# Patient Record
Sex: Female | Born: 1935 | Race: White | Hispanic: No | Smoking: Never smoker
Health system: Southern US, Community
[De-identification: ages and names within clinical notes are randomized; demographics above are authoritative.]

## PROBLEM LIST (undated history)

## (undated) DIAGNOSIS — I10 Essential (primary) hypertension: Secondary | ICD-10-CM

## (undated) DIAGNOSIS — Z8719 Personal history of other diseases of the digestive system: Secondary | ICD-10-CM

## (undated) DIAGNOSIS — N281 Cyst of kidney, acquired: Secondary | ICD-10-CM

## (undated) DIAGNOSIS — M1990 Unspecified osteoarthritis, unspecified site: Secondary | ICD-10-CM

## (undated) DIAGNOSIS — Z9289 Personal history of other medical treatment: Secondary | ICD-10-CM

## (undated) DIAGNOSIS — Z8701 Personal history of pneumonia (recurrent): Secondary | ICD-10-CM

## (undated) DIAGNOSIS — E785 Hyperlipidemia, unspecified: Secondary | ICD-10-CM

## (undated) DIAGNOSIS — K219 Gastro-esophageal reflux disease without esophagitis: Secondary | ICD-10-CM

## (undated) HISTORY — PX: EYE SURGERY: SHX253

## (undated) HISTORY — DX: Personal history of other medical treatment: Z92.89

## (undated) HISTORY — DX: Cyst of kidney, acquired: N28.1

## (undated) HISTORY — PX: RHINOPLASTY: SUR1284

## (undated) HISTORY — DX: Hyperlipidemia, unspecified: E78.5

## (undated) HISTORY — PX: COLONOSCOPY: SHX174

## (undated) HISTORY — PX: DILATION AND CURETTAGE OF UTERUS: SHX78

## (undated) HISTORY — DX: Essential (primary) hypertension: I10

## (undated) HISTORY — PX: TONSILLECTOMY: SUR1361

---

## 1998-04-14 ENCOUNTER — Other Ambulatory Visit: Admission: RE | Admit: 1998-04-14 | Discharge: 1998-04-14 | Payer: Self-pay | Admitting: *Deleted

## 1999-04-16 ENCOUNTER — Other Ambulatory Visit: Admission: RE | Admit: 1999-04-16 | Discharge: 1999-04-16 | Payer: Self-pay | Admitting: *Deleted

## 2000-04-12 ENCOUNTER — Other Ambulatory Visit: Admission: RE | Admit: 2000-04-12 | Discharge: 2000-04-12 | Payer: Self-pay | Admitting: *Deleted

## 2000-07-15 ENCOUNTER — Other Ambulatory Visit: Admission: RE | Admit: 2000-07-15 | Discharge: 2000-07-15 | Payer: Self-pay | Admitting: Urology

## 2000-07-20 ENCOUNTER — Encounter: Payer: Self-pay | Admitting: Urology

## 2000-07-20 ENCOUNTER — Encounter: Admission: RE | Admit: 2000-07-20 | Discharge: 2000-07-20 | Payer: Self-pay | Admitting: Urology

## 2000-07-22 ENCOUNTER — Other Ambulatory Visit: Admission: RE | Admit: 2000-07-22 | Discharge: 2000-07-22 | Payer: Self-pay | Admitting: Urology

## 2001-04-10 ENCOUNTER — Other Ambulatory Visit: Admission: RE | Admit: 2001-04-10 | Discharge: 2001-04-10 | Payer: Self-pay | Admitting: Obstetrics and Gynecology

## 2002-04-16 ENCOUNTER — Other Ambulatory Visit: Admission: RE | Admit: 2002-04-16 | Discharge: 2002-04-16 | Payer: Self-pay | Admitting: Obstetrics and Gynecology

## 2003-04-19 ENCOUNTER — Other Ambulatory Visit: Admission: RE | Admit: 2003-04-19 | Discharge: 2003-04-19 | Payer: Self-pay | Admitting: Obstetrics and Gynecology

## 2003-10-06 HISTORY — PX: HIP SURGERY: SHX245

## 2003-10-14 ENCOUNTER — Inpatient Hospital Stay (HOSPITAL_COMMUNITY): Admission: RE | Admit: 2003-10-14 | Discharge: 2003-10-18 | Payer: Self-pay | Admitting: Orthopedic Surgery

## 2005-03-09 ENCOUNTER — Ambulatory Visit: Payer: Self-pay | Admitting: Internal Medicine

## 2005-03-23 ENCOUNTER — Encounter (INDEPENDENT_AMBULATORY_CARE_PROVIDER_SITE_OTHER): Payer: Self-pay | Admitting: *Deleted

## 2005-03-23 ENCOUNTER — Ambulatory Visit: Payer: Self-pay | Admitting: Internal Medicine

## 2005-07-02 ENCOUNTER — Other Ambulatory Visit: Admission: RE | Admit: 2005-07-02 | Discharge: 2005-07-02 | Payer: Self-pay | Admitting: Obstetrics and Gynecology

## 2007-08-11 ENCOUNTER — Other Ambulatory Visit: Admission: RE | Admit: 2007-08-11 | Discharge: 2007-08-11 | Payer: Self-pay | Admitting: Obstetrics and Gynecology

## 2009-10-05 HISTORY — PX: HIP SURGERY: SHX245

## 2009-10-27 ENCOUNTER — Inpatient Hospital Stay (HOSPITAL_COMMUNITY): Admission: RE | Admit: 2009-10-27 | Discharge: 2009-10-30 | Payer: Self-pay | Admitting: Orthopedic Surgery

## 2010-02-05 ENCOUNTER — Encounter: Payer: Self-pay | Admitting: Internal Medicine

## 2010-02-23 ENCOUNTER — Encounter (INDEPENDENT_AMBULATORY_CARE_PROVIDER_SITE_OTHER): Payer: Self-pay | Admitting: *Deleted

## 2010-03-27 ENCOUNTER — Encounter (INDEPENDENT_AMBULATORY_CARE_PROVIDER_SITE_OTHER): Payer: Self-pay | Admitting: *Deleted

## 2010-03-31 ENCOUNTER — Ambulatory Visit: Payer: Self-pay | Admitting: Internal Medicine

## 2010-04-14 ENCOUNTER — Ambulatory Visit: Payer: Self-pay | Admitting: Internal Medicine

## 2010-07-07 NOTE — Letter (Signed)
Summary: [facility] Instructions  [facility]  [address] [place], [place] [address]   Phone: [phone]  Fax: [phone]       Lauren Hill    12-Dec-1934    MRN: [id number]        Procedure Day /Date:  04/14/10   Tuesday     Arrival Time:  8:00am      Procedure Time:  9:00am     Location of Procedure:                    _ x_  [facility] (4th Floor)                        PREPARATION FOR COLONOSCOPY WITH MOVIPREP   Starting 5 days prior to your procedure _ 11/3/11_ do not eat nuts, seeds, popcorn, corn, beans, peas,  salads, or any raw vegetables.  Do not take any fiber supplements (e.g. Metamucil, Citrucel, and Benefiber).  THE DAY BEFORE YOUR PROCEDURE         DATE:   04/13/10   DAY:  Monday  1.  Drink clear liquids the entire day-NO SOLID FOOD  2.  Do not drink anything colored red or purple.  Avoid juices with pulp.  No orange juice.  3.  Drink at least 64 oz. (8 glasses) of fluid/clear liquids during the day to prevent dehydration and help the prep work efficiently.  CLEAR LIQUIDS INCLUDE: Water Jello Ice Popsicles Tea (sugar ok, no milk/cream) Powdered fruit flavored drinks Coffee (sugar ok, no milk/cream) Gatorade Juice: apple, white grape, white cranberry  Lemonade Clear bullion, consomm, broth Carbonated beverages (any kind) Strained chicken noodle soup Hard Candy                             4.  In the morning, mix first dose of MoviPrep solution:    Empty 1 Pouch A and 1 Pouch B into the disposable container    Add lukewarm drinking water to the top line of the container. Mix to dissolve    Refrigerate (mixed solution should be used within 24 hrs)  5.  Begin drinking the prep at 5:00 p.m. The MoviPrep container is divided by 4 marks.   Every 15 minutes drink the solution down to the next mark (approximately 8 oz) until the full liter is complete.   6.  Follow completed prep with 16 oz of clear liquid of your  choice (Nothing red or purple).  Continue to drink clear liquids until bedtime.  7.  Before going to bed, mix second dose of MoviPrep solution:    Empty 1 Pouch A and 1 Pouch B into the disposable container    Add lukewarm drinking water to the top line of the container. Mix to dissolve    Refrigerate  THE DAY OF YOUR PROCEDURE      DATE:  04/14/10  DAY:  Tuesday  Beginning at 4:00 a.m. (5 hours before procedure):         1. Every 15 minutes, drink the solution down to the next mark (approx 8 oz) until the full liter is complete.  2. Follow completed prep with 16 oz. of clear liquid of your choice.    3. You may drink clear liquids until  7:00am  (2 HOURS BEFORE PROCEDURE).   MEDICATION INSTRUCTIONS  Unless otherwise instructed, you should take regular prescription medications with a  small sip of water   as early as possible the morning of your procedure.        OTHER INSTRUCTIONS  You will need a responsible adult at least 74 years of age to accompany you and drive you home.   This person must remain in the waiting room during your procedure.  Wear loose fitting clothing that is easily removed.  Leave jewelry and other valuables at home.  However, you may wish to bring a book to read or  an iPod/MP3 player to listen to music as you wait for your procedure to start.  Remove all body piercing jewelry and leave at home.  Total time from sign-in until discharge is approximately 2-3 hours.  You should go home directly after your procedure and rest.  You can resume normal activities the  day after your procedure.  The day of your procedure you should not:   Drive   Make legal decisions   Operate machinery   Drink alcohol   Return to work  You will receive specific instructions about eating, activities and medications before you leave.    The above instructions have been reviewed and explained to me by   Wyona Almas RN  March 31, 2010 11:26 AM     I  fully understand and can verbalize these instructions _____________________________ Date _________

## 2010-07-07 NOTE — Procedures (Signed)
Summary: Colonoscopy  Patient: Lauren Hill Note: All result statuses are Final unless otherwise noted.  Tests: (1) Colonoscopy (COL)   COL Colonoscopy           DONE     [facility]     [address]     [place], [place]  [address]           COLONOSCOPY PROCEDURE REPORT           PATIENT:  Lauren Hill, Lauren Hill  MR#:  [id number]     BIRTHDATE:  [birth year], 74 yrs. old  GENDER:  female     ENDOSCOPIST:  Wilhemina Bonito. Eda Keys, MD     REF. BY:  Surveillance Program Recall,     PROCEDURE DATE:  04/14/2010     PROCEDURE:  Surveillance Colonoscopy     ASA CLASS:  Class II     INDICATIONS:  history of pre-cancerous (adenomatous) colon polyps,     surveillance and high-risk screening ; index 2003 (TA); f/u 2006     (neg)     MEDICATIONS:   Fentanyl 75 mcg IV, Versed 9 mg IV           DESCRIPTION OF PROCEDURE:   After the risks benefits and     alternatives of the procedure were thoroughly explained, informed     consent was obtained.  Digital rectal exam was performed and     revealed no abnormalities.   The LB 180AL [id number] endoscope was     introduced through the anus and advanced to the cecum, which was     identified by both the appendix and ileocecal valve, without     limitations.Time to cecum = 2:00 min.  The quality of the prep was     good, using MoviPrep.  The instrument was then slowly withdrawn     (time = 13:33 min) as the colon was fully examined.     <<PROCEDUREIMAGES>>           FINDINGS:  Moderate diverticulosis was found in the sigmoid colon.     This was otherwise a normal examination of the colon.  No polyps     or cancers were seen.   Retroflexed views in the rectum revealed     internal hemorrhoids.    The scope was then withdrawn from the     patient and the procedure completed.           COMPLICATIONS:  None     ENDOSCOPIC IMPRESSION:     1) Moderate diverticulosis in the sigmoid colon     2) Otherwise normal examination     3) No polyps or cancers     4) Internal hemorrhoids     RECOMMENDATIONS:     1) Follow up colonoscopy in 5 years           ______________________________     Wilhemina Bonito. Eda Keys, MD           CC:  Geoffry Paradise, MD;The Patient           n.     Rosalie DoctorWilhemina Bonito. Eda Keys at 04/14/2010 09:56 AM           Lorain Childes, [phone]  Note: An exclamation mark (!) indicates a result that was not dispersed into the flowsheet. Document Creation Date: 04/14/2010 9:57 AM _______________________________________________________________________  (1) Order result status: Final Collection or observation date-time: 04/14/2010 09:51 Requested date-time:  Receipt date-time:  Reported date-time:  Referring Physician:   Ordering Physician: Fransico Setters [id number]) Specimen Source:  Source: Launa Grill Order Number: [id number] Lab site:   Appended Document: Colonoscopy    Clinical Lists Changes  Observations: Added new observation of COLONNXTDUE: 04/2015 (04/14/2010 13:08)

## 2010-07-07 NOTE — Letter (Signed)
Summary: Pre Visit Letter Revised  [facility]  [address] [place], [place] [address]   Phone: [phone]  Fax: [phone]        02/23/2010 MRN: [id number] Lauren Hill [address] [place], [place]  [address]             Procedure Date:  03-31-10      Welcome to the Gastroenterology Division at [facility].    You are scheduled to see a nurse for your pre-procedure visit on 04-14-10 at 9:00a.m. on the 3rd floor at [facility], [address]  We ask that you try to arrive at our office 15 minutes prior to your appointment time to allow for check-in.  Please take a minute to review the attached form.  If you answer "Yes" to one or more of the questions on the first page, we ask that you call the person listed at your earliest opportunity.  If you answer "No" to all of the questions, please complete the rest of the form and bring it to your appointment.    Your nurse visit will consist of discussing your medical and surgical history, your immediate family medical history, and your medications.   If you are unable to list all of your medications on the form, please bring the medication bottles to your appointment and we will list them.  We will need to be aware of both prescribed and over the counter drugs.  We will need to know exact dosage information as well.    Please be prepared to read and sign documents such as consent forms, a financial agreement, and acknowledgement forms.  If necessary, and with your consent, a friend or relative is welcome to sit-in on the nurse visit with you.  Please bring your insurance card so that we may make a copy of it.  If your insurance requires a referral to see a specialist, please bring your referral form from your primary care physician.  No co-pay is required for this nurse visit.     If you cannot keep your appointment, please call [phone] to cancel or reschedule prior to your appointment date.  This  allows [place] the opportunity to schedule an appointment for another patient in need of care.    Thank you for choosing [facility] for your medical needs.  We appreciate the opportunity to care for you.  Please visit [place] at our website  to learn more about our practice.  Sincerely, The Gastroenterology Division

## 2010-07-07 NOTE — Letter (Signed)
Summary: Colonoscopy Letter  [facility]  [address] [place], [place] [address]   Phone: [phone]  Fax: [phone]      February 05, 2010 MRN: [id number]   [facility] [address] [place], [place]  [address]   Dear Lauren Hill,   According to your medical record, it is time for you to schedule a Colonoscopy. The American Cancer Society recommends this procedure as a method to detect early colon cancer. Patients with a family history of colon cancer, or a personal history of colon polyps or inflammatory bowel disease are at increased risk.  This letter has been generated based on the recommendations made at the time of your procedure. If you feel that in your particular situation this may no longer apply, please contact our office.  Please call our office at [phone] to schedule this appointment or to update your records at your earliest convenience.  Thank you for cooperating with [place] to provide you with the very best care possible.   Sincerely,  Wilhemina Bonito. Marina Goodell, M.D.  [facility] [phone]

## 2010-07-07 NOTE — Miscellaneous (Signed)
Summary: [facility] Previsit/prep  Clinical Lists Changes  Medications: Added new medication of MOVIPREP 100 GM  SOLR (PEG-KCL-NACL-NASULF-NA ASC-C) As per prep instructions. - Signed Rx of MOVIPREP 100 GM  SOLR (PEG-KCL-NACL-NASULF-NA ASC-C) As per prep instructions.;  #1 x 0;  Signed;  Entered by: Wyona Almas RN;  Authorized by: Hilarie Fredrickson MD;  Method used: Electronically to [facility]*, [address], [place], [place]  [id number], Ph: [phone], Fax: [phone] Observations: Added new observation of NKA: T (03/31/2010 10:45)    Prescriptions: MOVIPREP 100 GM  SOLR (PEG-KCL-NACL-NASULF-NA ASC-C) As per prep instructions.  #1 x 0   Entered by:   Wyona Almas RN   Authorized by:   Hilarie Fredrickson MD   Signed by:   Wyona Almas RN on 03/31/2010   Method used:   Electronically to        [facility]* (retail)       [address]       [place], [place]  [id number]       Ph: [phone]       Fax: [phone]   RxID:   [id number]

## 2010-08-24 LAB — CBC
HCT: 33 % — ABNORMAL LOW (ref 36.0–46.0)
HCT: 33.4 % — ABNORMAL LOW (ref 36.0–46.0)
HCT: 34.3 % — ABNORMAL LOW (ref 36.0–46.0)
Hemoglobin: 11.3 g/dL — ABNORMAL LOW (ref 12.0–15.0)
Hemoglobin: 15 g/dL (ref 12.0–15.0)
MCHC: 33.8 g/dL (ref 30.0–36.0)
MCHC: 33.8 g/dL (ref 30.0–36.0)
MCHC: 33.8 g/dL (ref 30.0–36.0)
MCV: 92.9 fL (ref 78.0–100.0)
MCV: 93.1 fL (ref 78.0–100.0)
Platelets: 280 10*3/uL (ref 150–400)
RBC: 3.51 MIL/uL — ABNORMAL LOW (ref 3.87–5.11)
RBC: 3.59 MIL/uL — ABNORMAL LOW (ref 3.87–5.11)
RDW: 12.6 % (ref 11.5–15.5)
RDW: 12.7 % (ref 11.5–15.5)
RDW: 12.8 % (ref 11.5–15.5)
WBC: 12.3 10*3/uL — ABNORMAL HIGH (ref 4.0–10.5)
WBC: 17.1 10*3/uL — ABNORMAL HIGH (ref 4.0–10.5)

## 2010-08-24 LAB — BASIC METABOLIC PANEL
CO2: 27 mEq/L (ref 19–32)
Calcium: 8.5 mg/dL (ref 8.4–10.5)
Creatinine, Ser: 0.64 mg/dL (ref 0.4–1.2)
GFR calc Af Amer: >60 mL/min (ref >60)
GFR calc non Af Amer: >60 mL/min (ref >60)
Glucose, Bld: 132 mg/dL — ABNORMAL HIGH (ref 70–99)
Glucose, Bld: 134 mg/dL — ABNORMAL HIGH (ref 70–99)
Potassium: 3.9 mEq/L (ref 3.5–5.1)
Potassium: 3.9 mEq/L (ref 3.5–5.1)
Sodium: 138 mEq/L (ref 135–145)
Sodium: 139 mEq/L (ref 135–145)

## 2010-08-24 LAB — COMPREHENSIVE METABOLIC PANEL
AST: 29 U/L (ref 0–37)
Alkaline Phosphatase: 95 U/L (ref 39–117)
BUN: 12 mg/dL (ref 6–23)
Calcium: 9.9 mg/dL (ref 8.4–10.5)
GFR calc Af Amer: >60 mL/min (ref >60)
GFR calc non Af Amer: >60 mL/min (ref >60)
Potassium: 4.6 mEq/L (ref 3.5–5.1)
Sodium: 136 mEq/L (ref 135–145)

## 2010-08-24 LAB — TYPE AND SCREEN
ABO/RH(D): A POS
Antibody Screen: NEGATIVE

## 2010-08-24 LAB — URINE MICROSCOPIC-ADD ON

## 2010-08-24 LAB — PROTIME-INR
INR: 1.53 — ABNORMAL HIGH (ref 0.00–1.49)
Prothrombin Time: 13.2 seconds (ref 11.6–15.2)
Prothrombin Time: 18.3 seconds — ABNORMAL HIGH (ref 11.6–15.2)
Prothrombin Time: 20.3 seconds — ABNORMAL HIGH (ref 11.6–15.2)

## 2010-08-24 LAB — URINALYSIS, ROUTINE W REFLEX MICROSCOPIC
Bilirubin Urine: NEGATIVE
Specific Gravity, Urine: 1.007 (ref 1.005–1.030)
Urobilinogen, UA: 0.2 mg/dL (ref 0.0–1.0)

## 2010-08-24 LAB — APTT: aPTT: 44 seconds — ABNORMAL HIGH (ref 24–37)

## 2010-08-24 LAB — ABO/RH: ABO/RH(D): A POS

## 2010-10-23 NOTE — Discharge Summary (Signed)
NAME:  Lauren Hill, Lauren Hill NO.:  [id number]   MEDICAL RECORD NO.:  [id number]                   PATIENT TYPE:  INP   LOCATION:  0467                                 FACILITY:  [facility]   PHYSICIAN:  Ollen Gross, M.D.                 DATE OF BIRTH:  07-05-1934   DATE OF ADMISSION:  10/14/2003  DATE OF DISCHARGE:  10/18/2003                                 DISCHARGE SUMMARY   ADMISSION DIAGNOSES:  1. Osteoarthritis, right hip.  2. History of bronchitis.  3. History of pneumonia.  4. Hypertension.  5. Hiatal hernia.  6. Hemorrhoids.  7. History of urinary tract infection.  8. Degenerative joint disease.   DISCHARGE DIAGNOSES:  1. Osteoarthritis, right hip, status post right total hip arthroplasty.  2. History of bronchitis.  3. History of pneumonia.  4. Hypertension.  5. Hiatal hernia.  6. Hemorrhoids.  7. History of urinary tract infection.  8. Degenerative joint disease.   PROCEDURE:  On day of surgery, Oct 14, 2003, right total hip arthroplasty.  Surgeon ws Dr. Ollen Gross, assistant Lauren L. Perkins, PA-C. Spinal  anesthesia. Blood loss 700 cc.   CONSULTATIONS/COURTESY VISIT:  Dr. Jacky Kindle.   BRIEF HISTORY:  Lauren Hill is a 76 year old female with end-stage arthritis  of the right hip. She has had intractable pain and now presents for right  total hip arthroplasty.   LABORATORY DATA:  CBC on admission reveals hemoglobin of 14.1, hematocrit  42.2, white blood cell count 8.7, and differential all within normal limits.  Postoperative H&H 11.3 and 33.0 with last noted H&H of 11.1 and 32.6.  PT  and PTT preoperatively 11.8 and 35 respectively with an INR of 0.8. Serial  protimes followed with last noted PT-INR of 18.3 and 1.8. Chem panel on  admission all within normal limits. Serial BMETs were followed. Glucose went  up from 96 to 146 and down to 120.  BUN dropped from 16 to 4. Otherwise,  BMETs remained within normal limits. Urinalysis  preoperatively revealed  large hemoglobin, small leukocyte esterase, mini epithelial cells, 0-2 white  cells, 3-6 red cells, a few bacteria, hyaline cast. Blood group type A  positive.   Pelvis right hip films, preoperatively, revealed osteoarthritis involving  the right hip. Pelvis film confirmed bilateral hip arthritis, right more so  than left. Postoperative hip films taken on Oct 14, 2003, showed satisfactory  appearance of right total hip arthroplasty.   HOSPITAL COURSE:  The patient admitted to [facility], taken to the  operating room, and underwent the above-stated procedure without  complications. The patient tolerated the procedure well, later transferred  to the recovery room, and then to the orthopedic floor for continued  postoperative care. Vital signs were followed. The patient is given 24 hours  postoperative antibiotics in the form of Ancef, Coumadin for DVT  prophylaxis, and started back on her home medications, partial weightbearing  50% to  the right lower extremity. PT and OT were consulted postoperatively.  Hemovac drain that was placed at the time of surgery was pulled on  postoperative day one.  Fluids were KVO. She actually did very well for the  first night and actually got some sleep. On day two, Dr. Jacky Kindle dropped by  as a courtesy visit, her primary care Lauren Hill. She was actually doing  fairly well and started getting up with physical therapy and ambulating  approximately 60 feet. It is anticipated that she would do quite well and be  able to go home in the next day or so. Dressing change initiated on  postoperative day two and incision was healing well. She did have a little  bit of blistering proximal to the incision. Tegaderms were applied.  Hemoglobin remained stable. She continued to receive therapy and progressed  on day three and day four, and eventually got up to the point where she was  walking 130 feet. She was tolerating her medications,  been weaned over to  p.o. medications. Her PCA had been discontinued by day two. She was  tolerating her medication and was discharged home on Oct 18, 2003.   DISCHARGE PLAN:  1. The patient is discharged home on Oct 18, 2003.  2. For discharge diagnosis, please see above.  3. Discharge medications--Coumadin, Percocet, Robaxin.  4. Diet--low sodium diet.  5. Activity--she is partial weightbearing to the right lower extremity, home     health PT and home health nursing for continued total hip protocol.  6. Followup--in two weeks, to call the office for an  appointment at [phone].   DISPOSITION:  Home.   CONDITION ON DISCHARGE:  Improved.     Lauren L. Julien Girt, P.A.              Ollen Gross, M.D.    ALP/MEDQ  D:  11/20/2003  T:  11/20/2003  Job:  [id number]   cc:   Geoffry Paradise, M.D.  [address]  [place]  [place] [address]  Fax: [phone]

## 2010-10-23 NOTE — Op Note (Signed)
NAME:  Lauren Hill, Lauren Hill NO.:  [id number]   MEDICAL RECORD NO.:  [id number]                   PATIENT TYPE:  INP   LOCATION:  0011                                 FACILITY:  [facility]   PHYSICIAN:  Ollen Gross, M.D.                 DATE OF BIRTH:  03-07-1935   DATE OF PROCEDURE:  10/14/2003  DATE OF DISCHARGE:                                 OPERATIVE REPORT   PREOPERATIVE DIAGNOSIS:  Osteoarthritis, right hip.   POSTOPERATIVE DIAGNOSIS:  Osteoarthritis, right hip.   PROCEDURE:  Right total hip arthroplasty.   SURGEON:  Ollen Gross, M.D.   ASSISTANT:  Alexzandrew L. Julien Girt, P.A.   ANESTHESIA:  Spinal.   ESTIMATED BLOOD LOSS:  200.   DRAINS:  None.   COMPLICATIONS:  None.   CONDITION:  Stable to the recovery room.   CLINICAL NOTE:  Lauren Hill is a 76 year old female with end-stage arthritis  of the right hip with intractable pain.  She presents now for right total  hip arthroplasty.   PROCEDURE:  After successful administration of a spinal anesthetic, the  patient was placed in the left lateral decubitus position with the right  side up and held with a hip positioner.  The right lower extremity was  isolated from her perineum with plastic drapes and prepped and draped in the  usual sterile fashion.  A short posterolateral incision was made with a 10  blade through the subcutaneous tissue to the level of the fascial lata,  which was incised in line with the skin incision.  The sciatic nerve was  palpated and protected.  Short rotators are isolated off the femur.  A  capsulectomy is performed, and the hip is dislocated.  The center of the  femoral head is marked, and a trial prosthesis placed such that the center  of the trial head corresponds to the center of the native femoral head.  The  osteotomy line is marked at the femoral neck, and an osteotomy is made with  an oscillating saw.  The femur is then retracted anteriorly to gain  acetabular exposure.   The acetabular, labrum, and osteophytes are removed.  The reaming starts at  a 47, coursing in increments of 2 to a 51, then a 52 mm Pinnacle acetabular  shell was placed in anatomic position and transfixed with two dome screws.  The trial 32 mm neutral liner was placed.   The femur is prepared first with the canal finder, then irrigation.  Axial  reaming is performed at 13.5 mm, proximal reaming to an 18B, and the sleeve  machine to a small.  An 18B small trial sleeve is placed with an 18 x 13  stem and a 36+8 neck.  We went 10 degrees beyond her native anteversion, as  her native version was barely beyond neutral.  The 32+0 trial head is  placed.  The hip is reduced, and there is  a tiny bit of soft tissue laxity.  We converted her to a 32+4 neutral liner, and that eliminated most of the  laxity.  She had full extension.  Flexion and rotation showed 70 degrees  flexion, 40 degrees adduction, 90 degrees internal rotation and 90 degrees  flexion, 90 degrees of internal rotation without dislocating.  The hip is  then dislocated, and all trials were removed.  The permanent apex hole  eliminator is placed into the acetabular shell, and the permanent 32 mm  neutral plus Forum Marathon liner is placed into the shell.  The 18B small  sleeve is placed into the proximal femur with an 18x13 stem and the 36+8  neck, 10 degrees beyond her native anteversion.  Then the 32+0 head is  placed, and the hip is then reduced to the same stability of parameters.  The wound is copiously irrigated with antibiotic solution, and the short  rotator is reattached to the femur through drill holes.  The fascial lata is  closed over a Hemovac drain with interrupted #1 Vicryl.  The subcu is closed  with #1 and 2-0 Vicryl and subcuticular running 4-0 Monocryl.  The incision  is clean and dry.  Steri-Strips are applied.  The Hemovac is hooked to  suction, and a bulky sterile dressing applied.  She  is placed into a knee  immobilizer, awakened and transported to the recovery room in stable  condition.                                               Ollen Gross, M.D.    FA/MEDQ  D:  10/14/2003  T:  10/14/2003  Job:  [id number]

## 2010-10-23 NOTE — H&P (Signed)
NAME:  Lauren Hill, Lauren Hill NO.:  [id number]   MEDICAL RECORD NO.:  [id number]                   PATIENT TYPE:  INP   LOCATION:  0467                                 FACILITY:  [facility]   PHYSICIAN:  Ollen Gross, M.D.                 DATE OF BIRTH:  1935/05/17   DATE OF ADMISSION:  10/14/2003  DATE OF DISCHARGE:                                HISTORY & PHYSICAL   CHIEF COMPLAINT:  Right hip pain.   HISTORY OF PRESENT ILLNESS:  The patient is a 76 year old female with a 1-  year history of progressive right hip pain.  It started in her groin, and is  traveling down the anterior thigh.  It is worse with activity, and generally  gets better with rest.  She does have some difficulty ambulating at times.  She has tried anti-inflammatories in the past, which has not provided much  help.  She was seen in the office where x-rays showed end-stage arthritis of  the right hip with bone-on-bone changes and subchondral cystic formation.  It is felt she would benefit from undergoing a total hip replacement.  Risks  and benefits have been discussed with the patient, and she is at a point  where she would like to proceed with surgery now.   ALLERGIES:  No known drug allergies.   CURRENT MEDICATIONS:  1. Lipitor 10 mg daily.  2. Lotrel 5/10 twice a day.  3. Atenolol 50 mg daily.  4. Supplement of B-12.  5. Supplement of vitamin D.  6. Supplement of potassium gluconate.  7. Aspirin.  8. Ecotrin 81 mg  9. Caltrate 600 plus D.  10.      Anacin as needed.  11.      Advil as needed.   PAST MEDICAL HISTORY:  1. History of bronchitis.  2. History of pneumonia.  3. Hypertension.  4. Hiatal hernia.  5. Hemorrhoids.  6. History of urinary tract infections.  7. Degenerative disk disease.   PAST SURGICAL HISTORY:  1. Tonsillectomy.  2. D&C.   SOCIAL HISTORY:  Widowed.  Retired Runner, broadcasting/film/video.  Nonsmoker.  No alcohol.  Has 1  child.   FAMILY HISTORY:  Heart disease and  hypertension in both parents.   REVIEW OF SYSTEMS:  GENERAL:  No fevers, chills, night sweats.  NEUROLOGIC:  No seizures, syncope, or paralysis.  RESPIRATORY:  No shortness of breath,  productive cough, or hemoptysis.  CARDIOVASCULAR:  No chest pain, angina,  orthopnea.  GI:  No nausea, vomiting, diarrhea.  She does have some  intermittent constipation.  No blood or mucous in the stool.  GU:  No  dysuria, hematuria, or discharge.  MUSCULOSKELETAL:  Pertinent to the hip  found in the history of present illness.   PHYSICAL EXAMINATION:  VITAL SIGNS:  Pulse 76, respirations 14, blood  pressure 145/78.  GENERAL:  A 76 year old female, well-nourished, well-developed, in no acute  distress.  Alert, oriented, and cooperative.  Very pleasant at the time of  exam.  HEENT:  Normocephalic and atraumatic.  Pupils round and reactive.  Oropharynx is clear.  NECK:  Supple.  CHEST:  Clear, anterior and posterior chest walls.  No rhonchi, rales, or  wheezing.  HEART:  Regular rate and rhythm.  No murmurs.  ABDOMEN:  Soft.  Slightly round abdomen.  Bowel sounds are present.  Nontender.  RECTAL/BREAST/GENITALIA:  Not done.  Not pertinent to present illness.  EXTREMITIES:  Significant to the right hip.  Hip flexion of 90 degrees.  There is no internal rotation, no external rotation.  Only abduction is  limited to 10 degrees.  There is no shortening.  She does walk and ambulate  with an antalgic gait.   IMPRESSION:  1. Osteoarthritis of the right hip.  2. History of bronchitis.  3. History of pneumonia.  4. Hypertension.  5. Hiatal hernia.  6. Hemorrhoids.  7. History of urinary tract infection.  8. Degenerative disk disease.   PLAN:  The patient will be admitted to [facility] to undergo a  right total hip replacement arthroplasty.  The surgery will be performed by  Dr. Ollen Gross.     Alexzandrew L. Julien Girt, P.A.              Ollen Gross, M.D.    ALP/MEDQ  D:  10/14/2003   T:  10/14/2003  Job:  [id number]   cc:   Geoffry Paradise, M.D.  [address]  [place]  [place] [address]  Fax: [phone]

## 2012-09-12 ENCOUNTER — Other Ambulatory Visit: Payer: Self-pay | Admitting: Dermatology

## 2013-02-21 ENCOUNTER — Ambulatory Visit: Payer: Self-pay | Admitting: Obstetrics and Gynecology

## 2013-03-01 ENCOUNTER — Encounter: Payer: Self-pay | Admitting: Obstetrics & Gynecology

## 2013-03-02 ENCOUNTER — Ambulatory Visit (INDEPENDENT_AMBULATORY_CARE_PROVIDER_SITE_OTHER): Payer: Medicare Other | Admitting: Obstetrics & Gynecology

## 2013-03-02 ENCOUNTER — Encounter: Payer: Self-pay | Admitting: Obstetrics & Gynecology

## 2013-03-02 VITALS — BP 142/78 | HR 60 | Resp 12 | Ht 62.5 in | Wt 170.6 lb

## 2013-03-02 DIAGNOSIS — Z124 Encounter for screening for malignant neoplasm of cervix: Secondary | ICD-10-CM

## 2013-03-02 DIAGNOSIS — Z01419 Encounter for gynecological examination (general) (routine) without abnormal findings: Secondary | ICD-10-CM

## 2013-03-02 NOTE — Patient Instructions (Signed)

## 2013-03-02 NOTE — Progress Notes (Signed)
78 y.o. G1P1 WidowedCaucasianF here for annual exam.  Good friend with Marlene Lard.  Doing well.  Former patient of Dr. Tresa Res.  No vaginal bleeding.    Patient's last menstrual period was 06/08/1983.          Sexually active: no  The current method of family planning is none.    Exercising: yes  elipitical and ride bike Smoker:  no  Health Maintenance: Pap:  01/13/10 WNL History of abnormal Pap:  no MMG:  06/15/12 3D normal Colonoscopy:  2011 repeat in 5 years BMD:   12/10 will do this year with MMG TDaP:  Up to date with PCP Screening Labs: PCP, Hb today: PCP, Urine today: PCP   reports that she has never smoked. She has never used smokeless tobacco. She reports that she does not drink alcohol or use illicit drugs.  Past Medical History  Diagnosis Date  . Hypertension   . Hyperlipidemia   . Renal cyst   . History of blood transfusion     as an infant    Past Surgical History  Procedure Laterality Date  . Hip surgery  5/05    right hip replacement  . Hip surgery  5/11    left hip replacement  . Tonsillectomy    . Rhinoplasty    . Dilation and curettage of uterus      x2    Current Outpatient Prescriptions  Medication Sig Dispense Refill  . amLODipine-benazepril (LOTREL) 5-10 MG per capsule Take 1 capsule by mouth daily.      Marland Kitchen aspirin 81 MG tablet Take 81 mg by mouth daily.      Marland Kitchen atenolol (TENORMIN) 50 MG tablet Take 50 mg by mouth daily.      Marland Kitchen atorvastatin (LIPITOR) 10 MG tablet Take 10 mg by mouth daily.      . Calcium Carbonate (CALCIUM 600 PO) Take by mouth 2 (two) times daily. Plus vitamin d      . Multiple Vitamins-Minerals (ICAPS) CAPS Take by mouth 2 (two) times daily.      Marland Kitchen POTASSIUM PO Take by mouth.      . Calcium Carbonate Antacid (ANTACID PO) Take by mouth as needed.      . Famotidine (PEPCID PO) Take by mouth as needed.      . Ibuprofen (ADVIL PO) Take by mouth as needed.       No current facility-administered medications for this visit.     Family History  Problem Relation Age of Onset  . Heart disease Mother     ROS:  Pertinent items are noted in HPI.  Otherwise, a comprehensive ROS was negative.  Exam:   BP 142/78  Pulse 60  Resp 12  Ht 5' 2.5" (1.588 m)  Wt 170 lb 9.6 oz (77.384 kg)  BMI 30.69 kg/m2  LMP 06/08/1983  Weight change: +7 lbs  Height: 5' 2.5" (158.8 cm)  Ht Readings from Last 3 Encounters:  03/02/13 5' 2.5" (1.588 m)    General appearance: alert, cooperative and appears stated age Head: Normocephalic, without obvious abnormality, atraumatic Neck: no adenopathy, supple, symmetrical, trachea midline and thyroid normal to inspection and palpation Lungs: clear to auscultation bilaterally Breasts: normal appearance, no masses or tenderness Heart: regular rate and rhythm Abdomen: soft, non-tender; bowel sounds normal; no masses,  no organomegaly Extremities: extremities normal, atraumatic, no cyanosis or edema Skin: Skin color, texture, turgor normal. No rashes or lesions Lymph nodes: Cervical, supraclavicular, and axillary nodes normal. No abnormal inguinal nodes  palpated Neurologic: Grossly normal   Pelvic: External genitalia:  no lesions              Urethra:  normal appearing urethra with no masses, tenderness or lesions              Bartholins and Skenes: normal                 Vagina: normal appearing vagina with normal color and discharge, no lesions              Cervix: no lesions              Pap taken: yes Bimanual Exam:  Uterus:  normal size, contour, position, consistency, mobility, non-tender              Adnexa: normal adnexa and no mass, fullness, tenderness               Rectovaginal: Confirms               Anus:  normal sphincter tone, no lesions  A:  Well Woman with normal exam PMP, no HRT H/O bilateral hip replacements  P:   Mammogram yearly.  Doing 3D MMG pap smear today BMD order given for pt to do with MMG in January. Labs with PCP. return annually or prn  An  After Visit Summary was printed and given to the patient.

## 2014-03-22 ENCOUNTER — Encounter: Payer: Self-pay | Admitting: Obstetrics & Gynecology

## 2014-03-22 ENCOUNTER — Ambulatory Visit (INDEPENDENT_AMBULATORY_CARE_PROVIDER_SITE_OTHER): Payer: Medicare Other | Admitting: Obstetrics & Gynecology

## 2014-03-22 VITALS — BP 132/70 | HR 72 | Ht 62.25 in | Wt 175.0 lb

## 2014-03-22 DIAGNOSIS — I10 Essential (primary) hypertension: Secondary | ICD-10-CM

## 2014-03-22 DIAGNOSIS — R8290 Unspecified abnormal findings in urine: Secondary | ICD-10-CM

## 2014-03-22 DIAGNOSIS — Z01411 Encounter for gynecological examination (general) (routine) with abnormal findings: Secondary | ICD-10-CM

## 2014-03-22 DIAGNOSIS — E785 Hyperlipidemia, unspecified: Secondary | ICD-10-CM

## 2014-03-22 DIAGNOSIS — R312 Other microscopic hematuria: Secondary | ICD-10-CM

## 2014-03-22 DIAGNOSIS — R3129 Other microscopic hematuria: Secondary | ICD-10-CM

## 2014-03-22 MED ORDER — NITROFURANTOIN MONOHYD MACRO 100 MG PO CAPS: 100.0000 mg | ORAL_CAPSULE | Freq: Two times a day (BID) | ORAL | Status: DC

## 2014-03-22 NOTE — Addendum Note (Signed)
Addended by: Robley Fries on: 03/22/2014 05:20 PM   Modules accepted: Orders

## 2014-03-22 NOTE — Addendum Note (Signed)
Addended by: Megan Salon on: 03/22/2014 05:00 PM   Modules accepted: Orders

## 2014-03-22 NOTE — Progress Notes (Signed)
79 y.o. G1P1 WidowedCaucasianF here for annual exam.  No vaginal bleeding.  BMD in January was normal.  Has gotten her flu shot.    Reports she feels her urine has some increased odor.  She saw Lawerance Bach for years.  Has chronic microscopic hematuria.  Patient's last menstrual period was 06/08/1983.          Sexually active: No.  The current method of family planning is post menopausal status.    Exercising: Yes.    Home exercise routine includes stationary bike daily. Smoker:  no  Health Maintenance: Pap:   03/02/13 Neg History of abnormal Pap:  no MMG:  06/18/13 Bi-Rads Neg Colonoscopy:  2011 repeat in 5 years, [facility] BMD:   06/18/13 WNL repeat in 5 years TDaP:  Up to date with PCP Screening Labs: PCP, Hb today: PCP, Urine today: PCP   reports that she has never smoked. She has never used smokeless tobacco. She reports that she does not drink alcohol or use illicit drugs.  Past Medical History  Diagnosis Date  . Hypertension   . Hyperlipidemia   . Renal cyst   . History of blood transfusion     as an infant    Past Surgical History  Procedure Laterality Date  . Hip surgery  5/05    right hip replacement  . Hip surgery  5/11    left hip replacement  . Tonsillectomy    . Rhinoplasty    . Dilation and curettage of uterus      x2    Current Outpatient Prescriptions  Medication Sig Dispense Refill  . amLODipine-benazepril (LOTREL) 5-10 MG per capsule Take 1 capsule by mouth daily.      Marland Kitchen aspirin 81 MG tablet Take 81 mg by mouth daily.      Marland Kitchen atenolol (TENORMIN) 50 MG tablet Take 50 mg by mouth daily.      Marland Kitchen atorvastatin (LIPITOR) 10 MG tablet Take 10 mg by mouth daily.      . Calcium Carbonate (CALCIUM 600 PO) Take by mouth 2 (two) times daily. Plus vitamin d      . Famotidine (PEPCID PO) Take by mouth as needed.      . Multiple Vitamins-Minerals (ICAPS) CAPS Take by mouth 2 (two) times daily.      Marland Kitchen POTASSIUM PO Take by mouth.       No current facility-administered  medications for this visit.    Family History  Problem Relation Age of Onset  . Heart disease Mother     ROS:  Pertinent items are noted in HPI.  Otherwise, a comprehensive ROS was negative.  Exam:   BP 132/70  Pulse 72  Ht 5' 2.25" (1.581 m)  Wt 175 lb (79.379 kg)  BMI 31.76 kg/m2  LMP 06/08/1983  Weight change: +5# Height:   Height: 5' 2.25" (158.1 cm)  Ht Readings from Last 3 Encounters:  03/22/14 5' 2.25" (1.581 m)  03/02/13 5' 2.5" (1.588 m)    General appearance: alert, cooperative and appears stated age Head: Normocephalic, without obvious abnormality, atraumatic Neck: no adenopathy, supple, symmetrical, trachea midline and thyroid normal to inspection and palpation Lungs: clear to auscultation bilaterally Breasts: normal appearance, no masses or tenderness Heart: regular rate and rhythm Abdomen: soft, non-tender; bowel sounds normal; no masses,  no organomegaly Extremities: extremities normal, atraumatic, no cyanosis or edema Skin: Skin color, texture, turgor normal. No rashes or lesions Lymph nodes: Cervical, supraclavicular, and axillary nodes normal. No abnormal inguinal nodes  palpated Neurologic: Grossly normal   Pelvic: External genitalia:  no lesions              Urethra:  normal appearing urethra with no masses, tenderness or lesions              Bartholins and Skenes: normal                 Vagina: normal appearing vagina with normal color and discharge, no lesions              Cervix: no lesions              Pap taken: No. Bimanual Exam:  Uterus:  normal size, contour, position, consistency, mobility, non-tender              Adnexa: normal adnexa and no mass, fullness, tenderness               Rectovaginal: Confirms               Anus:  normal sphincter tone, no lesions  A:  Well Woman with normal exam  PMP, no HRT  H/O bilateral hip replacements  Urine odor  P: Mammogram yearly. Doing 3D MMG  pap smear 2014.  No pap today. Urine micro and  culture.  Macrobid 100mg  bid x 7 days.  Rx to pharmacy. Labs with Dr Reynaldo Minium return annually or prn  An After Visit Summary was printed and given to the patient.

## 2014-03-23 LAB — URINALYSIS, MICROSCOPIC ONLY
Bacteria, UA: NONE SEEN
CASTS: NONE SEEN
Crystals: NONE SEEN

## 2014-03-26 LAB — URINE CULTURE
COLONY COUNT: NO GROWTH
Organism ID, Bacteria: NO GROWTH

## 2014-03-28 ENCOUNTER — Telehealth: Payer: Self-pay

## 2014-03-28 NOTE — Telephone Encounter (Signed)
Message copied by Robley Fries on Thu Mar 28, 2014  1:17 PM ------      Message from: Megan Salon      Created: Wed Mar 27, 2014  8:35 AM       Inform pt urine culture was negative.  If took antibiotics, ok to finish.  Urine micro negative as well. ------

## 2014-03-28 NOTE — Telephone Encounter (Signed)
Lmtcb//kn 

## 2014-03-28 NOTE — Telephone Encounter (Signed)
Patient notified of results.//kn 

## 2014-04-08 ENCOUNTER — Encounter: Payer: Self-pay | Admitting: Obstetrics & Gynecology

## 2015-01-20 ENCOUNTER — Encounter: Payer: Self-pay | Admitting: Internal Medicine

## 2015-04-16 ENCOUNTER — Encounter: Payer: Self-pay | Admitting: Internal Medicine

## 2015-07-14 DIAGNOSIS — Z1231 Encounter for screening mammogram for malignant neoplasm of breast: Secondary | ICD-10-CM | POA: Diagnosis not present

## 2015-07-31 DIAGNOSIS — J209 Acute bronchitis, unspecified: Secondary | ICD-10-CM | POA: Diagnosis not present

## 2015-07-31 DIAGNOSIS — R062 Wheezing: Secondary | ICD-10-CM | POA: Diagnosis not present

## 2015-07-31 DIAGNOSIS — R05 Cough: Secondary | ICD-10-CM | POA: Diagnosis not present

## 2015-07-31 DIAGNOSIS — Z6829 Body mass index (BMI) 29.0-29.9, adult: Secondary | ICD-10-CM | POA: Diagnosis not present

## 2015-08-04 DIAGNOSIS — H524 Presbyopia: Secondary | ICD-10-CM | POA: Diagnosis not present

## 2015-08-04 DIAGNOSIS — Z961 Presence of intraocular lens: Secondary | ICD-10-CM | POA: Diagnosis not present

## 2015-09-16 DIAGNOSIS — D485 Neoplasm of uncertain behavior of skin: Secondary | ICD-10-CM | POA: Diagnosis not present

## 2015-09-16 DIAGNOSIS — D2261 Melanocytic nevi of right upper limb, including shoulder: Secondary | ICD-10-CM | POA: Diagnosis not present

## 2015-09-16 DIAGNOSIS — L570 Actinic keratosis: Secondary | ICD-10-CM | POA: Diagnosis not present

## 2015-09-16 DIAGNOSIS — L820 Inflamed seborrheic keratosis: Secondary | ICD-10-CM | POA: Diagnosis not present

## 2015-09-16 DIAGNOSIS — L821 Other seborrheic keratosis: Secondary | ICD-10-CM | POA: Diagnosis not present

## 2015-09-16 DIAGNOSIS — D1801 Hemangioma of skin and subcutaneous tissue: Secondary | ICD-10-CM | POA: Diagnosis not present

## 2015-11-10 DIAGNOSIS — I10 Essential (primary) hypertension: Secondary | ICD-10-CM | POA: Diagnosis not present

## 2015-11-10 DIAGNOSIS — E663 Overweight: Secondary | ICD-10-CM | POA: Diagnosis not present

## 2015-11-10 DIAGNOSIS — M1990 Unspecified osteoarthritis, unspecified site: Secondary | ICD-10-CM | POA: Diagnosis not present

## 2015-11-10 DIAGNOSIS — M4800 Spinal stenosis, site unspecified: Secondary | ICD-10-CM | POA: Diagnosis not present

## 2015-11-10 DIAGNOSIS — Z6827 Body mass index (BMI) 27.0-27.9, adult: Secondary | ICD-10-CM | POA: Diagnosis not present

## 2015-11-10 DIAGNOSIS — E784 Other hyperlipidemia: Secondary | ICD-10-CM | POA: Diagnosis not present

## 2015-11-10 DIAGNOSIS — Z1389 Encounter for screening for other disorder: Secondary | ICD-10-CM | POA: Diagnosis not present

## 2016-02-11 DIAGNOSIS — M19011 Primary osteoarthritis, right shoulder: Secondary | ICD-10-CM | POA: Diagnosis not present

## 2016-02-11 DIAGNOSIS — M19012 Primary osteoarthritis, left shoulder: Secondary | ICD-10-CM | POA: Diagnosis not present

## 2016-02-21 DIAGNOSIS — Z23 Encounter for immunization: Secondary | ICD-10-CM | POA: Diagnosis not present

## 2016-03-08 ENCOUNTER — Ambulatory Visit (INDEPENDENT_AMBULATORY_CARE_PROVIDER_SITE_OTHER): Payer: PPO | Admitting: Obstetrics & Gynecology

## 2016-03-08 ENCOUNTER — Encounter: Payer: Self-pay | Admitting: Obstetrics & Gynecology

## 2016-03-08 VITALS — BP 144/76 | HR 96 | Resp 14 | Ht 61.25 in | Wt 163.6 lb

## 2016-03-08 DIAGNOSIS — Z01419 Encounter for gynecological examination (general) (routine) without abnormal findings: Secondary | ICD-10-CM | POA: Diagnosis not present

## 2016-03-08 DIAGNOSIS — Z124 Encounter for screening for malignant neoplasm of cervix: Secondary | ICD-10-CM | POA: Diagnosis not present

## 2016-03-08 NOTE — Progress Notes (Signed)
81 y.o. G1P1 WidowedCaucasianF here for annual exam.  Doing well.  No vaginal bleeding.  Having right shoulder replacement with Dr. Onnie Graham.    PCP:  Dr. Reynaldo Minium.  Last appt was May.  Will do blood work in January with him.    Patient's last menstrual period was 06/08/1983.          Sexually active: No.  The current method of family planning is post menopausal status.    Exercising: Yes.    bicycle, eliptical  Smoker:  no  Health Maintenance: Pap:  03/02/13 negative  History of abnormal Pap:  yes MMG:  1/17 per patient at [facility]  Colonoscopy:  04/14/2010 diverticulosis repeat 5 years.  Pt reports was notified due to age that unless she was having problems to not have another one done.  BMD:   06/18/13 normal repeat 5 years  TDaP:  Up to date  Pneumonia vaccine(s):  ~5 years ago  Zostavax:   ~5 years ago  Hep C testing: not indicated  Screening Labs: PCP, Hb today: PCP, Urine today: PCP   reports that she has never smoked. She has never used smokeless tobacco. She reports that she does not drink alcohol or use drugs.  Past Medical History:  Diagnosis Date  . History of blood transfusion    as an infant  . Hyperlipidemia   . Hypertension   . Renal cyst     Past Surgical History:  Procedure Laterality Date  . DILATION AND CURETTAGE OF UTERUS     x2  . HIP SURGERY  5/05   right hip replacement  . HIP SURGERY  5/11   left hip replacement  . RHINOPLASTY    . TONSILLECTOMY      Current Outpatient Prescriptions  Medication Sig Dispense Refill  . amLODipine-benazepril (LOTREL) 5-10 MG per capsule Take 1 capsule by mouth daily.    Marland Kitchen aspirin 81 MG tablet Take 81 mg by mouth daily.    Marland Kitchen atenolol (TENORMIN) 50 MG tablet Take 50 mg by mouth daily.    Marland Kitchen atorvastatin (LIPITOR) 10 MG tablet Take 10 mg by mouth daily.    . Calcium Carbonate (CALCIUM 600 PO) Take by mouth 2 (two) times daily. Plus vitamin d    . Famotidine (PEPCID PO) Take by mouth as needed.    . Multiple  Vitamins-Minerals (ICAPS) CAPS Take by mouth 2 (two) times daily.    . nitrofurantoin, macrocrystal-monohydrate, (MACROBID) 100 MG capsule Take 1 capsule (100 mg total) by mouth 2 (two) times daily. 14 capsule 0  . POTASSIUM PO Take by mouth.     No current facility-administered medications for this visit.     Family History  Problem Relation Age of Onset  . Heart disease Mother     ROS:  Pertinent items are noted in HPI.  Otherwise, a comprehensive ROS was negative.  Exam:   Vitals:   03/08/16 1248  BP: (!) 144/76  Pulse: 96  Resp: 14    General appearance: alert, cooperative and appears stated age Head: Normocephalic, without obvious abnormality, atraumatic Neck: no adenopathy, supple, symmetrical, trachea midline and thyroid normal to inspection and palpation Lungs: clear to auscultation bilaterally Breasts: normal appearance, no masses or tenderness Heart: regular rate and rhythm Abdomen: soft, non-tender; bowel sounds normal; no masses,  no organomegaly Extremities: extremities normal, atraumatic, no cyanosis or edema Skin: Skin color, texture, turgor normal. No rashes or lesions Lymph nodes: Cervical, supraclavicular, and axillary nodes normal. No abnormal inguinal nodes palpated Neurologic:  Grossly normal   Pelvic: External genitalia:  no lesions              Urethra:  normal appearing urethra with no masses, tenderness or lesions              Bartholins and Skenes: normal                 Vagina: normal appearing vagina with normal color and discharge, no lesions              Cervix: no lesions              Pap taken: Yes.   Bimanual Exam:  Uterus:  normal size, contour, position, consistency, mobility, non-tender              Adnexa: normal adnexa and no mass, fullness, tenderness               Rectovaginal: Confirms               Anus:  normal sphincter tone, no lesions  Chaperone was present for exam.  A:    Well Woman with normal exam PMP, no HRT H/O  bilateral hip replacements Having left shoulder replacement later this month. Hypertension Elevated lipids  P:         Mammogram yearly.  Doing 3D MMG pap smear today Labs/vaccines with Dr. Reynaldo Minium Return annually or prn

## 2016-03-10 LAB — IPS PAP SMEAR ONLY

## 2016-03-23 NOTE — Pre-Procedure Instructions (Addendum)
TABITHA SPEISER  03/23/2016      [address] [place] [address] Phone: [phone] Fax: [phone]    Your procedure is scheduled on Thursday, October 26  Report to [facility] Admitting at 8:00 AM                Your surgery or procedure is scheduled for 10:00 AM   Call this number if you have problems the morning of surgery:[phone]                For any other questions, please call [phone], Monday - Friday 8 AM - 4 PM.   Remember:  Do not eat food or drink liquids after midnight Wednesday, October 25.   Take these medicines the morning of surgery with A SIP OF WATER: amLODipine (NORVASC), atenolol (TENORMIN), atorvastatin (LIPITOR).               7 days prior to surgery STOP taking any Aspirin, Aleve, Naproxen, Ibuprofen, Motrin, Advil, Goody's, BC's, all herbal medications, fish oil, and all vitamins                     [place]- Preparing For Surgery  Before surgery, you can play an important role. Because skin is not sterile, your skin needs to be as free of germs as possible. You can reduce the number of germs on your skin by washing with CHG (chlorahexidine gluconate) Soap before surgery.  CHG is an antiseptic cleaner which kills germs and bonds with the skin to continue killing germs even after washing.  Please do not use if you have an allergy to CHG or antibacterial soaps. If your skin becomes reddened/irritated stop using the CHG.  Do not shave (including legs and underarms) for at least 48 hours prior to first CHG shower. It is OK to shave your face.  Please follow these instructions carefully. 1. Shower the NIGHT BEFORE SURGERY and the MORNING OF SURGERY with CHG.   2. If you chose to wash your hair, wash your hair first as usual with your normal shampoo.  3. After you shampoo, rinse your hair and body thoroughly to remove the shampoo.  4. Use CHG  as you would any other liquid soap. You can apply CHG directly to the skin and wash gently with a scrungie or a clean washcloth.   5. Apply the CHG Soap to your body ONLY FROM THE NECK DOWN.  Do not use on open wounds or open sores. Avoid contact with your eyes, ears, mouth and genitals (private parts). Wash genitals (private parts) with your normal soap.  6. Wash thoroughly, paying special attention to the area where your surgery will be performed.  7. Thoroughly rinse your body with warm water from the neck down.  8. DO NOT shower/wash with your normal soap after using and rinsing off the CHG Soap.  9. Pat yourself dry with a CLEAN TOWEL.   10. Wear CLEAN PAJAMAS   11. Place CLEAN SHEETS on your bed the night of your first shower and DO NOT SLEEP WITH PETS. 12.  Day of Surgery: Do not apply any deodorants/lotions. Please wear clean clothes to the hospital/surgery center.      Do not wear jewelry, make-up or nail polish.  Do not wear lotions, powders, or perfumes, or deodorant.  Do not shave 48 hours prior to surgery.  Do not bring valuables to the hospital.  [facility] is not responsible for any belongings or valuables.  Contacts, dentures or bridgework may not be worn into surgery.  Leave your suitcase in the car.  After surgery it may be brought to your room.  For patients admitted to the hospital, discharge time will be determined by your treatment team.  Patients discharged the day of surgery will not be allowed to drive home.   Special instructions: -  Please read over the following fact sheets that you were given. Patient Instructions for Mupirocin Application, Pain Management, Coughing and Deep Breathing

## 2016-03-24 ENCOUNTER — Other Ambulatory Visit (HOSPITAL_COMMUNITY): Payer: Self-pay

## 2016-03-24 ENCOUNTER — Encounter (HOSPITAL_COMMUNITY): Payer: Self-pay

## 2016-03-24 ENCOUNTER — Encounter (HOSPITAL_COMMUNITY)
Admission: RE | Admit: 2016-03-24 | Discharge: 2016-03-24 | Disposition: A | Discharge status: Home / Self Care | Payer: PPO | Source: Ambulatory Visit | Attending: Orthopedic Surgery | Admitting: Orthopedic Surgery

## 2016-03-24 DIAGNOSIS — I10 Essential (primary) hypertension: Secondary | ICD-10-CM | POA: Insufficient documentation

## 2016-03-24 DIAGNOSIS — Z79899 Other long term (current) drug therapy: Secondary | ICD-10-CM | POA: Insufficient documentation

## 2016-03-24 DIAGNOSIS — Z7982 Long term (current) use of aspirin: Secondary | ICD-10-CM | POA: Insufficient documentation

## 2016-03-24 DIAGNOSIS — Z01812 Encounter for preprocedural laboratory examination: Secondary | ICD-10-CM | POA: Diagnosis not present

## 2016-03-24 DIAGNOSIS — Z01818 Encounter for other preprocedural examination: Secondary | ICD-10-CM | POA: Diagnosis not present

## 2016-03-24 DIAGNOSIS — E785 Hyperlipidemia, unspecified: Secondary | ICD-10-CM | POA: Insufficient documentation

## 2016-03-24 HISTORY — DX: Personal history of other diseases of the digestive system: Z87.19

## 2016-03-24 HISTORY — DX: Personal history of pneumonia (recurrent): Z87.01

## 2016-03-24 HISTORY — DX: Unspecified osteoarthritis, unspecified site: M19.90

## 2016-03-24 LAB — CBC
HEMATOCRIT: 44.9 % (ref 36.0–46.0)
HEMOGLOBIN: 14.9 g/dL (ref 12.0–15.0)
MCH: 30.3 pg (ref 26.0–34.0)
MCHC: 33.2 g/dL (ref 30.0–36.0)
MCV: 91.4 fL (ref 78.0–100.0)
PLATELETS: 292 10*3/uL (ref 150–400)
RBC: 4.91 MIL/uL (ref 3.87–5.11)
RDW: 13.1 % (ref 11.5–15.5)
WBC: 14 10*3/uL — AB (ref 4.0–10.5)

## 2016-03-24 LAB — BASIC METABOLIC PANEL
ANION GAP: 8 (ref 5–15)
BUN: 17 mg/dL (ref 6–20)
CHLORIDE: 108 mmol/L (ref 101–111)
CO2: 23 mmol/L (ref 22–32)
CREATININE: 0.65 mg/dL (ref 0.44–1.00)
Calcium: 9.9 mg/dL (ref 8.9–10.3)
GFR calc non Af Amer: >60 mL/min (ref >60)
Glucose, Bld: 112 mg/dL — ABNORMAL HIGH (ref 65–99)
POTASSIUM: 4.1 mmol/L (ref 3.5–5.1)
SODIUM: 139 mmol/L (ref 135–145)

## 2016-03-24 LAB — SURGICAL PCR SCREEN
MRSA, PCR: NEGATIVE
Staphylococcus aureus: NEGATIVE

## 2016-03-24 NOTE — Progress Notes (Signed)
PCP - [place] Cardiologist - denies  Chest x-ray - not needed EKG - requesting  - patient stated that she had sometime in jan/feb Stress Test - denies ECHO - denies Cardiac Cath - denies    Patient denies shortness of breath, fever, cough and chest pain at PAT appointment

## 2016-03-24 NOTE — Progress Notes (Signed)
Anesthesia Chart Review:  Pt is a 81 year old female scheduled for L total shoulder arthroplasty on 04/01/2016 with Justice Britain, MD.   - PCP is Burnard Bunting, MD.   PMH includes:  HTN, hyperlipidemia. Never smoker. BMI 30.5  Medications include: Amlodipine, ASA, atenolol, Lipitor, benazepril, Pepcid, potassium.  Preoperative labs reviewed.  WBC 14.0. I left voicemail for Velvet in Dr. Susie Cassette office about elevated WBC.   EKG 05/07/15: NSR with 1st degree AV block.   If no changes, I anticipate pt can proceed with surgery as scheduled.   Willeen Cass, FNP-BC [facility]/Anesthesiology Phone: [phone] 03/24/2016 3:51 PM

## 2016-03-31 MED ORDER — TRANEXAMIC ACID 1000 MG/10ML IV SOLN
1000.0000 mg | INTRAVENOUS | Status: AC
  Administered 2016-04-01: 1000 mg via INTRAVENOUS
  Filled 2016-03-31: qty 10

## 2016-04-01 ENCOUNTER — Inpatient Hospital Stay (HOSPITAL_COMMUNITY)
Admission: RE | Admit: 2016-04-01 | Discharge: 2016-04-02 | DRG: 483 | Disposition: A | Discharge status: Home Health | Payer: PPO | Source: Ambulatory Visit | Attending: Orthopedic Surgery | Admitting: Orthopedic Surgery

## 2016-04-01 ENCOUNTER — Encounter (HOSPITAL_COMMUNITY)
Admission: RE | Disposition: A | Discharge status: Home Health | Payer: Self-pay | Source: Ambulatory Visit | Attending: Orthopedic Surgery

## 2016-04-01 ENCOUNTER — Encounter (HOSPITAL_COMMUNITY): Payer: Self-pay | Admitting: Anesthesiology

## 2016-04-01 ENCOUNTER — Inpatient Hospital Stay (HOSPITAL_COMMUNITY): Payer: PPO | Admitting: Anesthesiology

## 2016-04-01 ENCOUNTER — Inpatient Hospital Stay (HOSPITAL_COMMUNITY): Payer: PPO | Admitting: Emergency Medicine

## 2016-04-01 DIAGNOSIS — E785 Hyperlipidemia, unspecified: Secondary | ICD-10-CM | POA: Diagnosis present

## 2016-04-01 DIAGNOSIS — Z79899 Other long term (current) drug therapy: Secondary | ICD-10-CM

## 2016-04-01 DIAGNOSIS — Z7982 Long term (current) use of aspirin: Secondary | ICD-10-CM

## 2016-04-01 DIAGNOSIS — M19012 Primary osteoarthritis, left shoulder: Secondary | ICD-10-CM | POA: Diagnosis not present

## 2016-04-01 DIAGNOSIS — I10 Essential (primary) hypertension: Secondary | ICD-10-CM | POA: Diagnosis present

## 2016-04-01 DIAGNOSIS — Z8249 Family history of ischemic heart disease and other diseases of the circulatory system: Secondary | ICD-10-CM | POA: Diagnosis not present

## 2016-04-01 DIAGNOSIS — Z96612 Presence of left artificial shoulder joint: Secondary | ICD-10-CM

## 2016-04-01 DIAGNOSIS — Z96643 Presence of artificial hip joint, bilateral: Secondary | ICD-10-CM | POA: Diagnosis present

## 2016-04-01 HISTORY — PX: TOTAL SHOULDER ARTHROPLASTY: SHX126

## 2016-04-01 SURGERY — ARTHROPLASTY, SHOULDER, TOTAL
Anesthesia: Regional | Site: Shoulder | Laterality: Left

## 2016-04-01 MED ORDER — DIAZEPAM 5 MG PO TABS
2.5000 mg | ORAL_TABLET | Freq: Four times a day (QID) | ORAL | Status: DC | PRN
  Filled 2016-04-01: qty 1

## 2016-04-01 MED ORDER — BENAZEPRIL HCL 10 MG PO TABS
10.0000 mg | ORAL_TABLET | Freq: Two times a day (BID) | ORAL | Status: DC
  Administered 2016-04-01: 10 mg via ORAL
  Filled 2016-04-01 (×2): qty 1

## 2016-04-01 MED ORDER — FENTANYL CITRATE (PF) 100 MCG/2ML IJ SOLN
INTRAMUSCULAR | Status: AC
  Filled 2016-04-01: qty 2

## 2016-04-01 MED ORDER — ONDANSETRON HCL 4 MG/2ML IJ SOLN
INTRAMUSCULAR | Status: AC
  Filled 2016-04-01: qty 2

## 2016-04-01 MED ORDER — FAMOTIDINE 20 MG PO TABS: 20.0000 mg | ORAL_TABLET | Freq: Every day | ORAL | Status: DC | PRN

## 2016-04-01 MED ORDER — KETOROLAC TROMETHAMINE 15 MG/ML IJ SOLN
7.5000 mg | Freq: Four times a day (QID) | INTRAMUSCULAR | Status: AC
  Administered 2016-04-01 – 2016-04-02 (×4): 7.5 mg via INTRAVENOUS
  Filled 2016-04-01 (×3): qty 1

## 2016-04-01 MED ORDER — SUGAMMADEX SODIUM 200 MG/2ML IV SOLN
INTRAVENOUS | Status: AC
  Filled 2016-04-01: qty 2

## 2016-04-01 MED ORDER — PROPOFOL 10 MG/ML IV BOLUS
INTRAVENOUS | Status: AC
  Filled 2016-04-01: qty 20

## 2016-04-01 MED ORDER — FENTANYL CITRATE (PF) 100 MCG/2ML IJ SOLN
INTRAMUSCULAR | Status: DC | PRN
  Administered 2016-04-01: 50 ug via INTRAVENOUS

## 2016-04-01 MED ORDER — CEFAZOLIN SODIUM-DEXTROSE 2-4 GM/100ML-% IV SOLN
2.0000 g | Freq: Three times a day (TID) | INTRAVENOUS | Status: DC
  Administered 2016-04-01 – 2016-04-02 (×2): 2 g via INTRAVENOUS
  Filled 2016-04-01 (×2): qty 100

## 2016-04-01 MED ORDER — EPHEDRINE SULFATE-NACL 50-0.9 MG/10ML-% IV SOSY
PREFILLED_SYRINGE | INTRAVENOUS | Status: DC | PRN
  Administered 2016-04-01: 5 mg via INTRAVENOUS

## 2016-04-01 MED ORDER — POLYETHYLENE GLYCOL 3350 17 G PO PACK: 17.0000 g | PACK | Freq: Every day | ORAL | Status: DC | PRN

## 2016-04-01 MED ORDER — ATENOLOL 50 MG PO TABS
50.0000 mg | ORAL_TABLET | Freq: Every day | ORAL | Status: DC
  Filled 2016-04-01: qty 1

## 2016-04-01 MED ORDER — SUGAMMADEX SODIUM 200 MG/2ML IV SOLN
INTRAVENOUS | Status: DC | PRN
  Administered 2016-04-01: 200 mg via INTRAVENOUS

## 2016-04-01 MED ORDER — ATORVASTATIN CALCIUM 20 MG PO TABS: 10.0000 mg | ORAL_TABLET | Freq: Every day | ORAL | Status: DC

## 2016-04-01 MED ORDER — ROCURONIUM BROMIDE 10 MG/ML (PF) SYRINGE
PREFILLED_SYRINGE | INTRAVENOUS | Status: AC
  Filled 2016-04-01: qty 10

## 2016-04-01 MED ORDER — AMLODIPINE BESYLATE 5 MG PO TABS
5.0000 mg | ORAL_TABLET | Freq: Two times a day (BID) | ORAL | Status: DC
  Administered 2016-04-01: 5 mg via ORAL
  Filled 2016-04-01 (×2): qty 1

## 2016-04-01 MED ORDER — LACTATED RINGERS IV SOLN: INTRAVENOUS | Status: DC

## 2016-04-01 MED ORDER — PHENYLEPHRINE HCL 10 MG/ML IJ SOLN
INTRAVENOUS | Status: DC | PRN
  Administered 2016-04-01: 50 ug/min via INTRAVENOUS

## 2016-04-01 MED ORDER — LACTATED RINGERS IV SOLN
INTRAVENOUS | Status: DC
  Administered 2016-04-01: 50 mL/h via INTRAVENOUS

## 2016-04-01 MED ORDER — PROPOFOL 10 MG/ML IV BOLUS
INTRAVENOUS | Status: DC | PRN
  Administered 2016-04-01: 120 mg via INTRAVENOUS

## 2016-04-01 MED ORDER — FLEET ENEMA 7-19 GM/118ML RE ENEM: 1.0000 | ENEMA | Freq: Once | RECTAL | Status: DC | PRN

## 2016-04-01 MED ORDER — 0.9 % SODIUM CHLORIDE (POUR BTL) OPTIME
TOPICAL | Status: DC | PRN
Start: 2016-04-01 — End: 2016-04-01
  Administered 2016-04-01: 1000 mL

## 2016-04-01 MED ORDER — ROCURONIUM BROMIDE 100 MG/10ML IV SOLN
INTRAVENOUS | Status: DC | PRN
  Administered 2016-04-01: 40 mg via INTRAVENOUS

## 2016-04-01 MED ORDER — BISACODYL 5 MG PO TBEC: 5.0000 mg | DELAYED_RELEASE_TABLET | Freq: Every day | ORAL | Status: DC | PRN

## 2016-04-01 MED ORDER — DIPHENHYDRAMINE HCL 12.5 MG/5ML PO ELIX: 12.5000 mg | ORAL_SOLUTION | ORAL | Status: DC | PRN

## 2016-04-01 MED ORDER — OXYCODONE HCL 5 MG PO TABS: 5.0000 mg | ORAL_TABLET | Freq: Once | ORAL | Status: DC | PRN

## 2016-04-01 MED ORDER — OXYCODONE HCL 5 MG PO TABS
5.0000 mg | ORAL_TABLET | ORAL | Status: DC | PRN
  Administered 2016-04-01 – 2016-04-02 (×4): 10 mg via ORAL
  Filled 2016-04-01 (×5): qty 2

## 2016-04-01 MED ORDER — EPHEDRINE 5 MG/ML INJ
INTRAVENOUS | Status: AC
  Filled 2016-04-01: qty 10

## 2016-04-01 MED ORDER — ASPIRIN 81 MG PO CHEW
81.0000 mg | CHEWABLE_TABLET | Freq: Every day | ORAL | Status: DC
  Administered 2016-04-01: 81 mg via ORAL
  Filled 2016-04-01: qty 1

## 2016-04-01 MED ORDER — PHENOL 1.4 % MT LIQD: 1.0000 | OROMUCOSAL | Status: DC | PRN

## 2016-04-01 MED ORDER — CHLORHEXIDINE GLUCONATE 4 % EX LIQD: 60.0000 mL | Freq: Once | CUTANEOUS | Status: DC

## 2016-04-01 MED ORDER — FENTANYL CITRATE (PF) 100 MCG/2ML IJ SOLN
INTRAMUSCULAR | Status: AC
  Administered 2016-04-01: 100 ug
  Filled 2016-04-01: qty 2

## 2016-04-01 MED ORDER — ACETAMINOPHEN 325 MG PO TABS: 650.0000 mg | ORAL_TABLET | Freq: Four times a day (QID) | ORAL | Status: DC | PRN

## 2016-04-01 MED ORDER — ONDANSETRON HCL 4 MG/2ML IJ SOLN: 4.0000 mg | Freq: Once | INTRAMUSCULAR | Status: DC | PRN

## 2016-04-01 MED ORDER — ALUM & MAG HYDROXIDE-SIMETH 200-200-20 MG/5ML PO SUSP: 30.0000 mL | ORAL | Status: DC | PRN

## 2016-04-01 MED ORDER — CEFAZOLIN SODIUM-DEXTROSE 2-4 GM/100ML-% IV SOLN
INTRAVENOUS | Status: AC
  Filled 2016-04-01: qty 100

## 2016-04-01 MED ORDER — HYDROMORPHONE HCL 1 MG/ML IJ SOLN
1.0000 mg | INTRAMUSCULAR | Status: DC | PRN
Start: 2016-04-01 — End: 2016-04-02

## 2016-04-01 MED ORDER — FENTANYL CITRATE (PF) 100 MCG/2ML IJ SOLN: 25.0000 ug | INTRAMUSCULAR | Status: DC | PRN

## 2016-04-01 MED ORDER — METOCLOPRAMIDE HCL 5 MG PO TABS: 5.0000 mg | ORAL_TABLET | Freq: Three times a day (TID) | ORAL | Status: DC | PRN

## 2016-04-01 MED ORDER — ONDANSETRON HCL 4 MG/2ML IJ SOLN
INTRAMUSCULAR | Status: DC | PRN
  Administered 2016-04-01: 4 mg via INTRAVENOUS

## 2016-04-01 MED ORDER — DOCUSATE SODIUM 100 MG PO CAPS
100.0000 mg | ORAL_CAPSULE | Freq: Two times a day (BID) | ORAL | Status: DC
  Administered 2016-04-01: 100 mg via ORAL
  Filled 2016-04-01: qty 1

## 2016-04-01 MED ORDER — MIDAZOLAM HCL 2 MG/2ML IJ SOLN
INTRAMUSCULAR | Status: AC
  Administered 2016-04-01: 1 mg
  Filled 2016-04-01: qty 2

## 2016-04-01 MED ORDER — ONDANSETRON HCL 4 MG PO TABS: 4.0000 mg | ORAL_TABLET | Freq: Four times a day (QID) | ORAL | Status: DC | PRN

## 2016-04-01 MED ORDER — OXYCODONE HCL 5 MG/5ML PO SOLN: 5.0000 mg | Freq: Once | ORAL | Status: DC | PRN

## 2016-04-01 MED ORDER — METOCLOPRAMIDE HCL 5 MG/ML IJ SOLN: 5.0000 mg | Freq: Three times a day (TID) | INTRAMUSCULAR | Status: DC | PRN

## 2016-04-01 MED ORDER — MENTHOL 3 MG MT LOZG: 1.0000 | LOZENGE | OROMUCOSAL | Status: DC | PRN

## 2016-04-01 MED ORDER — ACETAMINOPHEN 650 MG RE SUPP: 650.0000 mg | Freq: Four times a day (QID) | RECTAL | Status: DC | PRN

## 2016-04-01 MED ORDER — LIDOCAINE HCL (CARDIAC) 20 MG/ML IV SOLN
INTRAVENOUS | Status: DC | PRN
  Administered 2016-04-01: 40 mg via INTRAVENOUS

## 2016-04-01 MED ORDER — CEFAZOLIN SODIUM-DEXTROSE 2-4 GM/100ML-% IV SOLN
2.0000 g | INTRAVENOUS | Status: AC
  Administered 2016-04-01: 2 g via INTRAVENOUS

## 2016-04-01 MED ORDER — ONDANSETRON HCL 4 MG/2ML IJ SOLN: 4.0000 mg | Freq: Four times a day (QID) | INTRAMUSCULAR | Status: DC | PRN

## 2016-04-01 MED ORDER — KETOROLAC TROMETHAMINE 15 MG/ML IJ SOLN
INTRAMUSCULAR | Status: AC
  Filled 2016-04-01: qty 1

## 2016-04-01 SURGICAL SUPPLY — 65 items
ADH SKN CLS APL DERMABOND .7 (GAUZE/BANDAGES/DRESSINGS) ×1
ADH SKN CLS LQ APL DERMABOND (GAUZE/BANDAGES/DRESSINGS)
AID PSTN UNV HD RSTRNT DISP (MISCELLANEOUS) ×1
BLADE SAW SGTL 83.5X18.5 (BLADE) ×3 IMPLANT
CEMENT BONE DEPUY (Cement) ×3 IMPLANT
COVER SURGICAL LIGHT HANDLE (MISCELLANEOUS) ×3 IMPLANT
DERMABOND ADHESIVE PROPEN (GAUZE/BANDAGES/DRESSINGS)
DERMABOND ADVANCED (GAUZE/BANDAGES/DRESSINGS) ×2
DERMABOND ADVANCED .7 DNX12 (GAUZE/BANDAGES/DRESSINGS) IMPLANT
DERMABOND ADVANCED .7 DNX6 (GAUZE/BANDAGES/DRESSINGS) ×1 IMPLANT
DRAPE INCISE IOBAN 66X45 STRL (DRAPES) ×2 IMPLANT
DRAPE ORTHO SPLIT 77X108 STRL (DRAPES) ×6
DRAPE SURG 17X11 SM STRL (DRAPES) ×3 IMPLANT
DRAPE SURG ORHT 6 SPLT 77X108 (DRAPES) ×2 IMPLANT
DRAPE U-SHAPE 47X51 STRL (DRAPES) ×3 IMPLANT
DRSG AQUACEL AG ADV 3.5X10 (GAUZE/BANDAGES/DRESSINGS) ×3 IMPLANT
DURAPREP 26ML APPLICATOR (WOUND CARE) ×3 IMPLANT
ELECT BLADE 4.0 EZ CLEAN MEGAD (MISCELLANEOUS) ×3
ELECT CAUTERY BLADE 6.4 (BLADE) ×3 IMPLANT
ELECT REM PT RETURN 9FT ADLT (ELECTROSURGICAL) ×3
ELECTRODE BLDE 4.0 EZ CLN MEGD (MISCELLANEOUS) ×1 IMPLANT
ELECTRODE REM PT RTRN 9FT ADLT (ELECTROSURGICAL) ×1 IMPLANT
FACESHIELD WRAPAROUND (MASK) ×9 IMPLANT
FACESHIELD WRAPAROUND OR TEAM (MASK) ×3 IMPLANT
GLENOID WITH CLEAT MEDIUM (Shoulder) ×2 IMPLANT
GLOVE BIO SURGEON STRL SZ7.5 (GLOVE) ×3 IMPLANT
GLOVE BIO SURGEON STRL SZ8 (GLOVE) ×3 IMPLANT
GLOVE EUDERMIC 7 POWDERFREE (GLOVE) ×3 IMPLANT
GLOVE SS BIOGEL STRL SZ 7.5 (GLOVE) ×1 IMPLANT
GLOVE SUPERSENSE BIOGEL SZ 7.5 (GLOVE) ×2
GOWN STRL REUS W/ TWL LRG LVL3 (GOWN DISPOSABLE) ×1 IMPLANT
GOWN STRL REUS W/ TWL XL LVL3 (GOWN DISPOSABLE) ×2 IMPLANT
GOWN STRL REUS W/TWL LRG LVL3 (GOWN DISPOSABLE) ×3
GOWN STRL REUS W/TWL XL LVL3 (GOWN DISPOSABLE) ×6
HEAD HUMERAL UNIV 46/20 (Head) ×2 IMPLANT
KIT BASIN OR (CUSTOM PROCEDURE TRAY) ×3 IMPLANT
KIT ROOM TURNOVER OR (KITS) ×3 IMPLANT
KIT SET UNIVERSAL (KITS) ×2 IMPLANT
MANIFOLD NEPTUNE II (INSTRUMENTS) ×3 IMPLANT
NDL SUT .5 MAYO 1.404X.05X (NEEDLE) ×1 IMPLANT
NDL SUT 6 .5 CRC .975X.05 MAYO (NEEDLE) ×1 IMPLANT
NEEDLE MAYO TAPER (NEEDLE) ×3
NS IRRIG 1000ML POUR BTL (IV SOLUTION) ×3 IMPLANT
PACK SHOULDER (CUSTOM PROCEDURE TRAY) ×3 IMPLANT
PAD ARMBOARD 7.5X6 YLW CONV (MISCELLANEOUS) ×6 IMPLANT
RESTRAINT HEAD UNIVERSAL NS (MISCELLANEOUS) ×3 IMPLANT
SLING ARM FOAM STRAP LRG (SOFTGOODS) ×2 IMPLANT
SLING ARM XL FOAM STRAP (SOFTGOODS) ×1 IMPLANT
SMARTMIX MINI TOWER (MISCELLANEOUS) ×3
SPONGE LAP 18X18 X RAY DECT (DISPOSABLE) ×3 IMPLANT
SPONGE LAP 4X18 X RAY DECT (DISPOSABLE) ×3 IMPLANT
STEM HUMERAL APEX UNIVERS 8MM (Stem) ×2 IMPLANT
SUCTION FRAZIER HANDLE 10FR (MISCELLANEOUS) ×2
SUCTION TUBE FRAZIER 10FR DISP (MISCELLANEOUS) ×1 IMPLANT
SUT FIBERWIRE #2 38 T-5 BLUE (SUTURE) ×15
SUT MNCRL AB 3-0 PS2 18 (SUTURE) ×3 IMPLANT
SUT MON AB 2-0 CT1 36 (SUTURE) ×3 IMPLANT
SUT VIC AB 1 CT1 27 (SUTURE) ×3
SUT VIC AB 1 CT1 27XBRD ANBCTR (SUTURE) ×1 IMPLANT
SUTURE FIBERWR #2 38 T-5 BLUE (SUTURE) ×4 IMPLANT
SYR CONTROL 10ML LL (SYRINGE) IMPLANT
TOWEL OR 17X24 6PK STRL BLUE (TOWEL DISPOSABLE) ×3 IMPLANT
TOWEL OR 17X26 10 PK STRL BLUE (TOWEL DISPOSABLE) ×3 IMPLANT
TOWER SMARTMIX MINI (MISCELLANEOUS) ×1 IMPLANT
WATER STERILE IRR 1000ML POUR (IV SOLUTION) ×1 IMPLANT

## 2016-04-01 NOTE — H&P (Signed)
Lauren Hill    Chief Complaint: LEFT SHOULDER OA HPI: The patient is a 81 y.o. female with end stage left shoulder OA  Past Medical History:  Diagnosis Date  . Arthritis   . History of blood transfusion    as an infant  . History of hiatal hernia   . History of pneumonia   . Hyperlipidemia   . Hypertension   . Renal cyst     Past Surgical History:  Procedure Laterality Date  . COLONOSCOPY    . DILATION AND CURETTAGE OF UTERUS     x2  . EYE SURGERY     cataracts  . HIP SURGERY  5/05   right hip replacement  . HIP SURGERY  5/11   left hip replacement  . RHINOPLASTY    . TONSILLECTOMY      Family History  Problem Relation Age of Onset  . Heart disease Mother     Social History:  reports that she has never smoked. She has never used smokeless tobacco. She reports that she does not drink alcohol or use drugs.   Medications Prior to Admission  Medication Sig Dispense Refill  . amLODipine (NORVASC) 5 MG tablet Take 5 mg by mouth 2 (two) times daily.     Marland Kitchen aspirin 81 MG tablet Take 81 mg by mouth daily.    . Aspirin-Caffeine (ANACIN PO) Take 2 tablets by mouth 2 (two) times daily as needed (pain).    Marland Kitchen atenolol (TENORMIN) 50 MG tablet Take 50 mg by mouth daily.    Marland Kitchen atorvastatin (LIPITOR) 10 MG tablet Take 10 mg by mouth daily.    . benazepril (LOTENSIN) 10 MG tablet Take 10 mg by mouth 2 (two) times daily.     . calcium carbonate (OSCAL) 1500 (600 Ca) MG TABS tablet Take 600 mg of elemental calcium by mouth daily.    . Multiple Vitamins-Minerals (ICAPS) CAPS Take 1 capsule by mouth daily.     . Potassium 99 MG TABS Take 99 mg by mouth daily.    . Famotidine (PEPCID PO) Take 1 tablet by mouth daily as needed (heartburn).        Physical Exam: left shoulder with painful and restricted motion as noted at recent office visits  Vitals  Temp:  [98.4 F (36.9 C)] 98.4 F (36.9 C) (10/26 0831) Pulse Rate:  [75-81] 75 (10/26 0925) Resp:  [16-18] 16 (10/26  0925) BP: (133-175)/(65-82) 133/68 (10/26 0925) SpO2:  [96 %-97 %] 97 % (10/26 0925) Weight:  [74.4 kg (164 lb)] 74.4 kg (164 lb) (10/26 0831)  Assessment/Plan  Impression: LEFT SHOULDER OA  Plan of Action: Procedure(s): LEFT TOTAL SHOULDER ARTHROPLASTY  Lauren Hill 04/01/2016, 9:41 AM Contact # [phone]

## 2016-04-01 NOTE — Discharge Instructions (Signed)

## 2016-04-01 NOTE — Anesthesia Procedure Notes (Signed)
Procedure Name: Intubation Date/Time: 04/01/2016 10:15 AM Performed by: Rush Farmer E Pre-anesthesia Checklist: Patient identified, Emergency Drugs available, Suction available and Patient being monitored Patient Re-evaluated:Patient Re-evaluated prior to inductionOxygen Delivery Method: Circle system utilized Preoxygenation: Pre-oxygenation with 100% oxygen Intubation Type: IV induction Ventilation: Mask ventilation without difficulty and Oral airway inserted - appropriate to patient size Laryngoscope Size: Mac and 3 Grade View: Grade I Tube type: Oral Tube size: 7.0 mm Number of attempts: 1 Airway Equipment and Method: Stylet Placement Confirmation: ETT inserted through vocal cords under direct vision,  CO2 detector and breath sounds checked- equal and bilateral Secured at: 20 cm Tube secured with: Tape Dental Injury: Teeth and Oropharynx as per pre-operative assessment

## 2016-04-01 NOTE — Transfer of Care (Signed)
Immediate Anesthesia Transfer of Care Note  Patient: [name]  Procedure(s) Performed: Procedure(s): LEFT TOTAL SHOULDER ARTHROPLASTY (Left)  Patient Location: PACU  Anesthesia Type:GA combined with regional for post-op pain  Level of Consciousness: awake, alert  and oriented  Airway & Oxygen Therapy: Patient Spontanous Breathing and Patient connected to nasal cannula oxygen  Post-op Assessment: Report given to RN, Post -op Vital signs reviewed and stable and Patient moving all extremities  Post vital signs: Reviewed and stable  Last Vitals:  Vitals:   04/01/16 0920 04/01/16 0925  BP: (!) 154/69 133/68  Pulse:  75  Resp:  16  Temp:      Last Pain:  Vitals:   04/01/16 0831  TempSrc: Oral      Patients Stated Pain Goal: 3 ([id number])  Complications: No apparent anesthesia complications

## 2016-04-01 NOTE — Anesthesia Postprocedure Evaluation (Signed)
Anesthesia Post Note  Patient: Lauren Hill  Procedure(s) Performed: Procedure(s) (LRB): LEFT TOTAL SHOULDER ARTHROPLASTY (Left)  Patient location during evaluation: PACU Anesthesia Type: General and Regional Level of consciousness: awake, awake and alert, oriented and sedated Pain management: pain level controlled Vital Signs Assessment: post-procedure vital signs reviewed and stable Respiratory status: spontaneous breathing, nonlabored ventilation, respiratory function stable and patient connected to nasal cannula oxygen Cardiovascular status: blood pressure returned to baseline Anesthetic complications: no    Last Vitals:  Vitals:   04/01/16 1315 04/01/16 1347  BP: (!) 113/56 135/73  Pulse: 80 85  Resp: 16 16  Temp:  36.6 C    Last Pain:  Vitals:   04/01/16 1230  TempSrc:   PainSc: 0-No pain                 Marysa Wessner COKER

## 2016-04-01 NOTE — Anesthesia Preprocedure Evaluation (Addendum)
Anesthesia Evaluation  Patient identified by MRN, date of birth, ID band Patient awake    Reviewed: Allergy & Precautions, NPO status , Patient's Chart, lab work & pertinent test results  Airway Mallampati: II  TM Distance: >3 FB Neck ROM: Full    Dental  (+) Teeth Intact, Dental Advisory Given   Pulmonary    breath sounds clear to auscultation       Cardiovascular hypertension,  Rhythm:Regular Rate:Normal     Neuro/Psych    GI/Hepatic   Endo/Other    Renal/GU      Musculoskeletal   Abdominal   Peds  Hematology   Anesthesia Other Findings   Reproductive/Obstetrics                             Anesthesia Physical Anesthesia Plan  ASA: II  Anesthesia Plan: General and Regional   Post-op Pain Management:    Induction: Intravenous  Airway Management Planned: Oral ETT  Additional Equipment:   Intra-op Plan:   Post-operative Plan: Extubation in OR  Informed Consent: I have reviewed the patients History and Physical, chart, labs and discussed the procedure including the risks, benefits and alternatives for the proposed anesthesia with the patient or authorized representative who has indicated his/her understanding and acceptance.   Dental advisory given  Plan Discussed with: CRNA and Anesthesiologist  Anesthesia Plan Comments:         Anesthesia Quick Evaluation  

## 2016-04-01 NOTE — Op Note (Addendum)
04/01/2016  11:53 AM  PATIENT:   Lauren Hill  81 y.o. female  PRE-OPERATIVE DIAGNOSIS:  LEFT SHOULDER OA  POST-OPERATIVE DIAGNOSIS:  same  PROCEDURE:  L TSA #8 stem, 46x20 head, medium glenoid  SURGEON:  Nathalya Wolanski, Metta Clines M.D.  ASSISTANTS: Shuford pac   ANESTHESIA:   GET + ISB  EBL: 150  SPECIMEN:  none  Drains: none   PATIENT DISPOSITION:  PACU - hemodynamically stable.    PLAN OF CARE: Admit for overnight observation  Dictation# [id number]   Contact # [phone]

## 2016-04-02 ENCOUNTER — Encounter (HOSPITAL_COMMUNITY): Payer: Self-pay | Admitting: Orthopedic Surgery

## 2016-04-02 MED ORDER — ONDANSETRON HCL 4 MG PO TABS: 4.0000 mg | ORAL_TABLET | Freq: Three times a day (TID) | ORAL | 0 refills | Status: DC | PRN

## 2016-04-02 MED ORDER — METHOCARBAMOL 500 MG PO TABS: 500.0000 mg | ORAL_TABLET | Freq: Three times a day (TID) | ORAL | 1 refills | Status: DC | PRN

## 2016-04-02 MED ORDER — OXYCODONE-ACETAMINOPHEN 5-325 MG PO TABS: 1.0000 | ORAL_TABLET | ORAL | 0 refills | Status: DC | PRN

## 2016-04-02 NOTE — Discharge Summary (Signed)
PATIENT ID:      Lauren Hill  MRN:     [id number] DOB/AGE:    Mar 11, 1935 / 81 y.o.     DISCHARGE SUMMARY  ADMISSION DATE:    04/01/2016 DISCHARGE DATE:    ADMISSION DIAGNOSIS: LEFT SHOULDER OA Past Medical History:  Diagnosis Date  . Arthritis   . History of blood transfusion    as an infant  . History of hiatal hernia   . History of pneumonia   . Hyperlipidemia   . Hypertension   . Renal cyst     DISCHARGE DIAGNOSIS:   Active Problems:   S/P shoulder replacement, left   PROCEDURE: Procedure(s): LEFT TOTAL SHOULDER ARTHROPLASTY on 04/01/2016  CONSULTS:    HISTORY:  See H&P in chart.  HOSPITAL COURSE:  Lauren Hill is a 81 y.o. admitted on 04/01/2016 with a diagnosis of LEFT SHOULDER OA.  They were brought to the operating room on 04/01/2016 and underwent Procedure(s): LEFT TOTAL SHOULDER ARTHROPLASTY.    They were given perioperative antibiotics: Anti-infectives    Start     Dose/Rate Route Frequency Ordered Stop   04/01/16 1800  ceFAZolin (ANCEF) IVPB 2g/100 mL premix     2 g 200 mL/hr over 30 Minutes Intravenous Every 8 hours 04/01/16 1355 04/02/16 1759   04/01/16 0834  ceFAZolin (ANCEF) 2-4 GM/100ML-% IVPB    Comments:  Leandrew Koyanagi   : cabinet override      04/01/16 0834 04/01/16 1016   04/01/16 0831  ceFAZolin (ANCEF) IVPB 2g/100 mL premix     2 g 200 mL/hr over 30 Minutes Intravenous On call to O.R. 04/01/16 0831 04/01/16 1026    .  Patient underwent the above named procedure and tolerated it well. The following day they were hemodynamically stable and pain was controlled on oral analgesics. They were neurovascularly intact to the operative extremity. OT was ordered and worked with patient per protocol. They were medically and orthopaedically stable for discharge on day 1. Home health services were arranged.    DIAGNOSTIC STUDIES:  RECENT RADIOGRAPHIC STUDIES :  No results found.  RECENT VITAL SIGNS:  Patient Vitals for the past 24  hrs:  BP Temp Temp src Pulse Resp SpO2 Weight  04/02/16 0432 119/63 98.6 F (37 C) Oral 95 18 94 % -  04/01/16 2324 114/64 98.4 F (36.9 C) Oral 89 18 94 % -  04/01/16 1952 134/64 98.9 F (37.2 C) Oral 96 18 95 % -  04/01/16 1347 135/73 97.8 F (36.6 C) - 85 16 96 % -  04/01/16 1315 (!) 113/56 - - 80 16 100 % -  04/01/16 1300 (!) 118/59 - - 83 11 100 % -  04/01/16 1245 122/61 - - 83 18 100 % -  04/01/16 1230 131/64 - - 87 13 100 % -  04/01/16 1215 118/61 - - 87 11 99 % -  04/01/16 1210 120/71 97.7 F (36.5 C) - 88 12 97 % -  04/01/16 0925 133/68 - - 75 16 97 % -  04/01/16 0920 (!) 154/69 - - - - 97 % -  04/01/16 0916 (!) 144/65 - - - - - -  04/01/16 0905 (!) 175/82 - - - - - -  04/01/16 0831 (!) 170/70 98.4 F (36.9 C) Oral 81 18 96 % 74.4 kg (164 lb)  .  RECENT EKG RESULTS:   No orders found for this or any previous visit.  DISCHARGE INSTRUCTIONS:    DISCHARGE MEDICATIONS:  Medication List    TAKE these medications   amLODipine 5 MG tablet Commonly known as:  NORVASC Take 5 mg by mouth 2 (two) times daily.   ANACIN PO Take 2 tablets by mouth 2 (two) times daily as needed (pain).   aspirin 81 MG tablet Take 81 mg by mouth daily.   atenolol 50 MG tablet Commonly known as:  TENORMIN Take 50 mg by mouth daily.   atorvastatin 10 MG tablet Commonly known as:  LIPITOR Take 10 mg by mouth daily.   benazepril 10 MG tablet Commonly known as:  LOTENSIN Take 10 mg by mouth 2 (two) times daily.   calcium carbonate 1500 (600 Ca) MG Tabs tablet Commonly known as:  OSCAL Take 600 mg of elemental calcium by mouth daily.   ICAPS Caps Take 1 capsule by mouth daily.   methocarbamol 500 MG tablet Commonly known as:  ROBAXIN Take 1 tablet (500 mg total) by mouth every 8 (eight) hours as needed for muscle spasms.   ondansetron 4 MG tablet Commonly known as:  ZOFRAN Take 1 tablet (4 mg total) by mouth every 8 (eight) hours as needed for nausea or vomiting.    oxyCODONE-acetaminophen 5-325 MG tablet Commonly known as:  PERCOCET Take 1-2 tablets by mouth every 4 (four) hours as needed.   PEPCID PO Take 1 tablet by mouth daily as needed (heartburn).   Potassium 99 MG Tabs Take 99 mg by mouth daily.       FOLLOW UP VISIT:   Follow-up Information    KEVIN M SUPPLE, MD.   Specialty:  Orthopedic Surgery Why:  call to be seen in 10- 14 days Contact information: [address] [place] [address] [id number]           DISCHARGE TO: Home  DISPOSITION: Good  DISCHARGE CONDITION:  Festus Barren for Dr. Justice Britain 04/02/2016, 8:18 AM

## 2016-04-02 NOTE — Op Note (Signed)
NAMESHAHD, VENZOR NO.:  [id number]  MEDICAL RECORD NO.:  [id number]  LOCATION:  3C08C                        FACILITY:  [facility]  PHYSICIAN:  Metta Clines. Jaskaran Dauzat, M.D.  DATE OF BIRTH:  June 26, 1934  DATE OF PROCEDURE:  04/01/2016 DATE OF DISCHARGE:                              OPERATIVE REPORT   PREOPERATIVE DIAGNOSIS:  End-stage left shoulder osteoarthritis.  POSTOPERATIVE DIAGNOSIS:  End-stage left shoulder osteoarthritis.  PROCEDURE:  Left total shoulder arthroplasty utilizing a press-fit size 8 Arthrex stem, a 46 x 20 eccentric head and a medium glenoid.  SURGEON:  Metta Clines. Kienan Doublin, M.D.  Terrence DupontOlivia Mackie A. Shuford, P.A.-C.  ANESTHESIA:  General endotracheal as well as interscalene block.  ESTIMATED BLOOD LOSS:  150 mL.  DRAINS:  None.  HISTORY:  Lauren Hill is a 81 year old female, who has had chronic and progressive increasing bilateral shoulder pain, left more symptomatic than the right with radiographic evidence for end-stage osteoarthritis. She is brought to the operating room at this time for planned total shoulder arthroplasty on the left.  Preoperatively, I counseled Lauren Hill regarding treatment options and potential risks versus benefits thereof.  Possible surgical complications were all reviewed including bleeding, infection, neurovascular injury, persistent pain, loss of motion, anesthetic complication, failure of the implant and possible need for additional surgery.  She understands and accepts and agrees with the planned procedure.  PROCEDURE IN DETAIL:  After undergoing routine preop evaluation, the patient received prophylactic antibiotics and an interscalene block was established in the holding area by the Anesthesia Department.  Placed supine on the operating table, underwent smooth induction of a general endotracheal anesthesia.  Placed in the beach-chair position and appropriately padded and protected.  Left shoulder girdle  region was then sterilely prepped and draped in standard fashion.  Time-out was called.  An anterior deltopectoral approach of left shoulder was made through an 8-cm incision.  Skin flaps were elevated.  Electrocautery was used for hemostasis.  The dissection was then carried deeply, the pectoral interval was then developed proximally and distally where the vein taken laterally.  The upper centimeter of the pec major was tenotomized to enhance exposure.  Conjoined tendon was mobilized, retracted medially.  We then unroofed the biceps tendon.  This was tenotomized for later tenodesis.  We then split the rotator cuff from the apex of the bicipital groove to the base of the coracoid and then separated the subscap from the lesser tuberosity leaving a 1 cm cuff of tissue for later repair and the free margin of the subscap was then tagged with a series of four grasping #2 FiberWire sutures.  We then divided the capsule attachments from the anteroinferior and inferior aspects of the humeral neck allowing complete delivery of the head through the wound.  We outlined our proposed humeral head resection. With the extramedullary guide, we then performed the resection maintaining native retroversion approximately 30 degrees and this was performed with an oscillating saw.  We carefully protected the rotator cuff superiorly and posteriorly and then removed the osteophytes from the anterior and inferior margins of the humeral neck.  At this point, we then broached the humeral canal, first hand reamed up to size 7, broached  up to size 8 with excellent fit.  The size 7 trial stem was then placed into the medullary canal with metal cap on the proximal surface.  At this point, we then exposed the glenoid with combination of Fukuda, pitchfork, and snake-tongue retractors.  I resected the proximal stump of the long head biceps and performed a circumferential labral excision gaining complete visualization of  the entire periphery of the glenoid.  A guidepin was then placed in the center of the glenoid with the medium trial showing the best fit.  We then reamed to a stable subchondral bony bed, placed a central drill hole of the superior and inferior peg and slot respectively and of note, the bone was markedly sclerotic, which took some additional efforts to make sure we had proper fit of our trial implant.  The glenoid was irrigated, cleaned and dried. Cement was mixed and introduced into the superior and inferior peg and slot respectively.  The final implant was then impacted into position with excellent fit and fixation.  At this point, we then returned our attention to the proximal humerus where the canal was cleaned.  We impacted the size 8 stem with excellent bony fit and fixation.  We then tightened the proximal locking screws of the implant.  At this point, we then performed a series of trial reductions and felt that the 46 x 20 head gave the best soft tissue coverage with 50% translation of the humeral head and glenoid.  The final 46 x 20 head was then impacted after the [facility] taper was cleaned and dried.  The final reduction showed excellent fit and fixation, good soft tissue balance.  At this point, the subscapularis was then repaired back to the lesser tuberosity through the cuff of tissue and we repaired the rotator interval with figure-of-eight FiberWire as well.  The biceps tendon was then tenodesed to the upper border of the pec major.  At this point, the arm was taken through range of motion, easily showing 45 degrees of external rotation with no excessive soft tissue tension and good shoulder stability and motion.  The wound was then copiously irrigated, hemostasis was obtained.  The deltopectoral interval was then closed with a series of figure-of-eight 0 Vicryl sutures.  2-0 Monocryl for the subcu layer, intracuticular 3-0 Monocryl for the skin followed by Dermabond.   Aquacel dressing.  Left arm was placed in a sling.  The patient was awakened, extubated and taken to the recovery room in stable condition.  Lauren Loges, PA-C, was used as an Environmental consultant throughout this case, was essential for help with positioning the patient, positioning the extremity, management of the retractors, tissue manipulation, implantation of the prosthesis, wound closure, and intraoperative decision making.     Metta Clines. Khilee Hendricksen, M.D.     KMS/MEDQ  D:  04/01/2016  T:  04/02/2016  Job:  [id number]

## 2016-04-02 NOTE — Progress Notes (Signed)
Pt doing well. Pt and daughter given D/C instructions with shoulder exercise activities and Rx's, verbal understanding was provided. Pt's incision is clean and dry with no sign of infection. Pt's IV was removed prior to D/C. Pt D/C'd home with arm in sling per MD order. Home Health OT was arranged by CM. Pt is stable @ D/C and has no other needs at this time. Holli Humbles, RN

## 2016-04-02 NOTE — Progress Notes (Signed)
PT Cancellation Note  Patient Details Name: Lauren Hill MRN: [id number] DOB: December 15, 1934   Cancelled Treatment:    Reason Eval/Treat Not Completed: PT screened, no needs identified, will sign off (OT saw and walked with pt without acute PT needs at this time)   Melford Aase 04/02/2016, 8:29 AM  Elwyn Reach, [place]

## 2016-04-02 NOTE — Evaluation (Signed)
Occupational Therapy Evaluation Patient Details Name: Lauren Hill MRN: [id number] DOB: Oct 07, 1934 Today's Date: 04/02/2016    History of Present Illness Pt is a 81 y.o. female s/p L TSA. PMHx: Arthritis, Hyperlipidemia, HTN, L THA.   Clinical Impression   Pt reports she was independent with ADL PTA. Currently pt overall min guard assist for functional mobility and min assist for ADL. All shoulder, ADL, and safety education completed with pt and daughter; they verbalized understanding. Pt planning to d/c home with 24/7 supervision from her daughter. Recommending HHOT for follow up to maximize independence and safety with ADL and functional mobility upon return home. Pt ready to d/c home from OT standpoint but will continue to follow acutely to address established goals.    Follow Up Recommendations  Home health OT;Supervision/Assistance - 24 hour    Equipment Recommendations  None recommended by OT    Recommendations for Other Services       Precautions / Restrictions Precautions Precautions: Fall;Shoulder Type of Shoulder Precautions: Passive protocol: sling on for sleep and mobility, can come out of sling in controlled environment. AROM elbow, wrist, hand OK. Pendulums OK. PROM FF 90, ABD 60, ER 30. No AROM. Shoulder Interventions: Shoulder sling/immobilizer;Off for dressing/bathing/exercises (For sleep. Can come out of sling in controlled environment.) Precaution Booklet Issued: Yes (comment) Precaution Comments: Educated pt and wife on precautions. Required Braces or Orthoses: Sling Restrictions Weight Bearing Restrictions: Yes LUE Weight Bearing: Non weight bearing      Mobility Bed Mobility Overal bed mobility: Needs Assistance Bed Mobility: Supine to Sit     Supine to sit: Min assist     General bed mobility comments: Min assist to pull up with RUE into sitting. Educated daughter on proper technique. Pt has recliner chair at home, reports she can sleep in  recliner if needed.  Transfers Overall transfer level: Needs assistance Equipment used: None Transfers: Sit to/from Stand Sit to Stand: Supervision         General transfer comment: Supervision for safety; no physical assist required.    Balance Overall balance assessment: Needs assistance Sitting-balance support: Feet supported;No upper extremity supported Sitting balance-Leahy Scale: Good     Standing balance support: No upper extremity supported;During functional activity Standing balance-Leahy Scale: Good                              ADL Overall ADL's : Needs assistance/impaired Eating/Feeding: Independent;Sitting   Grooming: Supervision/safety;Standing   Upper Body Bathing: Minimal assitance;Sitting Upper Body Bathing Details (indicate cue type and reason): Educated pt and daughter on UB bathing technique. Lower Body Bathing: Min guard;Sit to/from stand   Upper Body Dressing : Minimal assistance;Sitting Upper Body Dressing Details (indicate cue type and reason): Educated pt and daughter on UB dressing technique. Pt able to return demo with min assist. Lower Body Dressing: Minimal assistance;Sit to/from stand   Toilet Transfer: Min guard;Ambulation;Comfort height toilet Toilet Transfer Details (indicate cue type and reason): Simulated by sit to stand from EOB with functional mobility. Toileting- Clothing Manipulation and Hygiene: Supervision/safety;Sit to/from stand       Functional mobility during ADLs: Min guard General ADL Comments: Educated pt on bed mobility, NWB status on LUE, sling management and wear schedule, LUE exercises, ice for edema and pain.     Vision Vision Assessment?: No apparent visual deficits   Perception     Praxis      Pertinent Vitals/Pain Pain Assessment: Faces Faces  Pain Scale: Hurts even more Pain Location: L shoulder  Pain Descriptors / Indicators: Dull;Aching;Sore Pain Intervention(s): Monitored during  session;Repositioned;Ice applied     Hand Dominance Right   Extremity/Trunk Assessment Upper Extremity Assessment Upper Extremity Assessment: LUE deficits/detail LUE Deficits / Details: Elbow, wrist, hand ROM WFL. LUE: Unable to fully assess due to immobilization   Lower Extremity Assessment Lower Extremity Assessment: Overall WFL for tasks assessed   Cervical / Trunk Assessment Cervical / Trunk Assessment: Normal   Communication Communication Communication: No difficulties   Cognition Arousal/Alertness: Awake/alert Behavior During Therapy: WFL for tasks assessed/performed Overall Cognitive Status: Within Functional Limits for tasks assessed                     General Comments       Exercises Exercises: Shoulder     Shoulder Instructions Shoulder Instructions Donning/doffing shirt without moving shoulder: Minimal assistance (educated) Method for sponge bathing under operated UE: Minimal assistance (educated) Donning/doffing sling/immobilizer: Moderate assistance;Caregiver independent with task (educated) Correct positioning of sling/immobilizer: Minimal assistance (educated) Pendulum exercises (written home exercise program): Min-guard (educated) ROM for elbow, wrist and digits of operated UE: Supervision/safety (educated) Sling wearing schedule (on at all times/off for ADL's): Supervision/safety (educated) Proper positioning of operated UE when showering: Supervision/safety (educated) Positioning of UE while sleeping: Minimal assistance (educated)    Home Living Family/patient expects to be discharged to:: Private residence Living Arrangements: Alone Available Help at Discharge: Family;Available 24 hours/day (daughter staying with pt initially) Type of Home: Other(Comment) (townhome) Home Access: Stairs to enter [employer] of Steps: 2   Home Layout: One level     Bathroom Shower/Tub: Tub/shower unit Shower/tub characteristics:  Curtain Biochemist, clinical: Handicapped height     Home Equipment: [place] - single point          Prior Functioning/Environment Level of Independence: Independent        Comments: Does not use AD for mobility. Drives.        OT Problem List: Decreased activity tolerance;Impaired balance (sitting and/or standing);Decreased knowledge of precautions;Impaired UE functional use;Pain;Increased edema   OT Treatment/Interventions: Self-care/ADL training;Therapeutic exercise;Energy conservation;Therapeutic activities;Patient/family education;Balance training    OT Goals(Current goals can be found in the care plan section) Acute Rehab OT Goals Patient Stated Goal: return home OT Goal Formulation: With patient/family Time For Goal Achievement: 04/16/16 Potential to Achieve Goals: Good ADL Goals Pt Will Perform Upper Body Bathing: with supervision;sitting Pt Will Perform Upper Body Dressing: with supervision;sitting Pt Will Transfer to Toilet: with supervision;ambulating;regular height toilet Pt Will Perform Toileting - Clothing Manipulation and hygiene: with supervision;sit to/from stand Pt Will Perform Tub/Shower Transfer: Tub transfer;with supervision;ambulating Additional ADL Goal #1: Pt will independently demo LUE pendulum exercises with supervision.  OT Frequency: Min 3X/week   Barriers to D/C:            Co-evaluation              End of Session Equipment Utilized During Treatment: Other (comment) (sling) Nurse Communication: Mobility status;Other (comment) (HHOT for follow up, no equipment needs)  Activity Tolerance: Patient tolerated treatment well Patient left: in chair;with call bell/phone within reach;with family/visitor present   Time: [id number] OT Time Calculation (min): 54 min Charges:  OT General Charges $OT Visit: 1 Procedure OT Evaluation $OT Eval Moderate Complexity: 1 Procedure OT Treatments $Self Care/Home Management : 23-37 mins $Therapeutic  Exercise: 8-22 mins G-Codes:     Binnie Kand M.S., OTR/L Pager: [phone]  04/02/2016, 8:52 AM

## 2016-04-02 NOTE — Care Management Note (Signed)
Case Management Note  Patient Details  Name: Lauren Hill MRN: [id number] Date of Birth: [birth year]  Subjective/Objective:    81 yr old female s/p left total shoulder arthroplasty.                 Action/Plan:  Case manager spoke with patient and daughter concerning Home health needs. Choice was offered for [place]. CM called referral to [place], Liaison with [facility]. Patient will have family support at discharge.    Expected Discharge Date:   04/02/16               Expected Discharge Plan:  [place]  In-House Referral:     Discharge planning Services  CM Consult  Post Acute Care Choice:  Home Health Choice offered to:  Patient, Adult Children  DME Arranged:  N/A DME Agency:     HH Arranged:  OT, PT Eva Agency:  [place]  Status of Service:  Completed, signed off  If discussed at [place] of Stay Meetings, dates discussed:    Additional Comments:  Ninfa Meeker, RN 04/02/2016, 10:28 AM

## 2016-04-05 DIAGNOSIS — Z7982 Long term (current) use of aspirin: Secondary | ICD-10-CM | POA: Diagnosis not present

## 2016-04-05 DIAGNOSIS — Z79891 Long term (current) use of opiate analgesic: Secondary | ICD-10-CM | POA: Diagnosis not present

## 2016-04-05 DIAGNOSIS — E785 Hyperlipidemia, unspecified: Secondary | ICD-10-CM | POA: Diagnosis not present

## 2016-04-05 DIAGNOSIS — Z471 Aftercare following joint replacement surgery: Secondary | ICD-10-CM | POA: Diagnosis not present

## 2016-04-05 DIAGNOSIS — I10 Essential (primary) hypertension: Secondary | ICD-10-CM | POA: Diagnosis not present

## 2016-04-05 DIAGNOSIS — Z96643 Presence of artificial hip joint, bilateral: Secondary | ICD-10-CM | POA: Diagnosis not present

## 2016-04-05 DIAGNOSIS — Z96612 Presence of left artificial shoulder joint: Secondary | ICD-10-CM | POA: Diagnosis not present

## 2016-04-08 DIAGNOSIS — Z7982 Long term (current) use of aspirin: Secondary | ICD-10-CM | POA: Diagnosis not present

## 2016-04-08 DIAGNOSIS — Z96612 Presence of left artificial shoulder joint: Secondary | ICD-10-CM | POA: Diagnosis not present

## 2016-04-08 DIAGNOSIS — Z79891 Long term (current) use of opiate analgesic: Secondary | ICD-10-CM | POA: Diagnosis not present

## 2016-04-08 DIAGNOSIS — I10 Essential (primary) hypertension: Secondary | ICD-10-CM | POA: Diagnosis not present

## 2016-04-08 DIAGNOSIS — Z96643 Presence of artificial hip joint, bilateral: Secondary | ICD-10-CM | POA: Diagnosis not present

## 2016-04-08 DIAGNOSIS — E785 Hyperlipidemia, unspecified: Secondary | ICD-10-CM | POA: Diagnosis not present

## 2016-04-08 DIAGNOSIS — Z471 Aftercare following joint replacement surgery: Secondary | ICD-10-CM | POA: Diagnosis not present

## 2016-04-13 DIAGNOSIS — I10 Essential (primary) hypertension: Secondary | ICD-10-CM | POA: Diagnosis not present

## 2016-04-13 DIAGNOSIS — Z96643 Presence of artificial hip joint, bilateral: Secondary | ICD-10-CM | POA: Diagnosis not present

## 2016-04-13 DIAGNOSIS — E785 Hyperlipidemia, unspecified: Secondary | ICD-10-CM | POA: Diagnosis not present

## 2016-04-13 DIAGNOSIS — Z79891 Long term (current) use of opiate analgesic: Secondary | ICD-10-CM | POA: Diagnosis not present

## 2016-04-13 DIAGNOSIS — Z7982 Long term (current) use of aspirin: Secondary | ICD-10-CM | POA: Diagnosis not present

## 2016-04-13 DIAGNOSIS — Z471 Aftercare following joint replacement surgery: Secondary | ICD-10-CM | POA: Diagnosis not present

## 2016-04-13 DIAGNOSIS — Z96612 Presence of left artificial shoulder joint: Secondary | ICD-10-CM | POA: Diagnosis not present

## 2016-04-14 DIAGNOSIS — Z96612 Presence of left artificial shoulder joint: Secondary | ICD-10-CM | POA: Diagnosis not present

## 2016-04-20 DIAGNOSIS — I10 Essential (primary) hypertension: Secondary | ICD-10-CM | POA: Diagnosis not present

## 2016-04-20 DIAGNOSIS — Z79891 Long term (current) use of opiate analgesic: Secondary | ICD-10-CM | POA: Diagnosis not present

## 2016-04-20 DIAGNOSIS — Z96612 Presence of left artificial shoulder joint: Secondary | ICD-10-CM | POA: Diagnosis not present

## 2016-04-20 DIAGNOSIS — Z471 Aftercare following joint replacement surgery: Secondary | ICD-10-CM | POA: Diagnosis not present

## 2016-04-20 DIAGNOSIS — Z7982 Long term (current) use of aspirin: Secondary | ICD-10-CM | POA: Diagnosis not present

## 2016-04-20 DIAGNOSIS — E785 Hyperlipidemia, unspecified: Secondary | ICD-10-CM | POA: Diagnosis not present

## 2016-04-20 DIAGNOSIS — Z96643 Presence of artificial hip joint, bilateral: Secondary | ICD-10-CM | POA: Diagnosis not present

## 2016-04-22 DIAGNOSIS — M25612 Stiffness of left shoulder, not elsewhere classified: Secondary | ICD-10-CM | POA: Diagnosis not present

## 2016-04-26 DIAGNOSIS — M25612 Stiffness of left shoulder, not elsewhere classified: Secondary | ICD-10-CM | POA: Diagnosis not present

## 2016-04-28 DIAGNOSIS — M25612 Stiffness of left shoulder, not elsewhere classified: Secondary | ICD-10-CM | POA: Diagnosis not present

## 2016-05-03 DIAGNOSIS — M25612 Stiffness of left shoulder, not elsewhere classified: Secondary | ICD-10-CM | POA: Diagnosis not present

## 2016-05-06 DIAGNOSIS — M25612 Stiffness of left shoulder, not elsewhere classified: Secondary | ICD-10-CM | POA: Diagnosis not present

## 2016-05-11 DIAGNOSIS — M25612 Stiffness of left shoulder, not elsewhere classified: Secondary | ICD-10-CM | POA: Diagnosis not present

## 2016-05-13 DIAGNOSIS — M25612 Stiffness of left shoulder, not elsewhere classified: Secondary | ICD-10-CM | POA: Diagnosis not present

## 2016-05-17 DIAGNOSIS — M25612 Stiffness of left shoulder, not elsewhere classified: Secondary | ICD-10-CM | POA: Diagnosis not present

## 2016-05-20 DIAGNOSIS — M25612 Stiffness of left shoulder, not elsewhere classified: Secondary | ICD-10-CM | POA: Diagnosis not present

## 2016-05-25 DIAGNOSIS — M25612 Stiffness of left shoulder, not elsewhere classified: Secondary | ICD-10-CM | POA: Diagnosis not present

## 2016-05-27 DIAGNOSIS — M25612 Stiffness of left shoulder, not elsewhere classified: Secondary | ICD-10-CM | POA: Diagnosis not present

## 2016-06-03 DIAGNOSIS — M25612 Stiffness of left shoulder, not elsewhere classified: Secondary | ICD-10-CM | POA: Diagnosis not present

## 2016-06-08 DIAGNOSIS — M25612 Stiffness of left shoulder, not elsewhere classified: Secondary | ICD-10-CM | POA: Diagnosis not present

## 2016-06-09 DIAGNOSIS — E784 Other hyperlipidemia: Secondary | ICD-10-CM | POA: Diagnosis not present

## 2016-06-09 DIAGNOSIS — R8290 Unspecified abnormal findings in urine: Secondary | ICD-10-CM | POA: Diagnosis not present

## 2016-06-09 DIAGNOSIS — I10 Essential (primary) hypertension: Secondary | ICD-10-CM | POA: Diagnosis not present

## 2016-06-10 DIAGNOSIS — M25612 Stiffness of left shoulder, not elsewhere classified: Secondary | ICD-10-CM | POA: Diagnosis not present

## 2016-06-15 DIAGNOSIS — M25612 Stiffness of left shoulder, not elsewhere classified: Secondary | ICD-10-CM | POA: Diagnosis not present

## 2016-06-16 DIAGNOSIS — I10 Essential (primary) hypertension: Secondary | ICD-10-CM | POA: Diagnosis not present

## 2016-06-16 DIAGNOSIS — Z23 Encounter for immunization: Secondary | ICD-10-CM | POA: Diagnosis not present

## 2016-06-16 DIAGNOSIS — Z6828 Body mass index (BMI) 28.0-28.9, adult: Secondary | ICD-10-CM | POA: Diagnosis not present

## 2016-06-16 DIAGNOSIS — Z1389 Encounter for screening for other disorder: Secondary | ICD-10-CM | POA: Diagnosis not present

## 2016-06-16 DIAGNOSIS — M4800 Spinal stenosis, site unspecified: Secondary | ICD-10-CM | POA: Diagnosis not present

## 2016-06-16 DIAGNOSIS — Z0000 Encounter for general adult medical examination without abnormal findings: Secondary | ICD-10-CM | POA: Diagnosis not present

## 2016-06-16 DIAGNOSIS — E784 Other hyperlipidemia: Secondary | ICD-10-CM | POA: Diagnosis not present

## 2016-06-16 DIAGNOSIS — E663 Overweight: Secondary | ICD-10-CM | POA: Diagnosis not present

## 2016-06-16 DIAGNOSIS — M1990 Unspecified osteoarthritis, unspecified site: Secondary | ICD-10-CM | POA: Diagnosis not present

## 2016-06-22 DIAGNOSIS — M25612 Stiffness of left shoulder, not elsewhere classified: Secondary | ICD-10-CM | POA: Diagnosis not present

## 2016-06-25 DIAGNOSIS — M170 Bilateral primary osteoarthritis of knee: Secondary | ICD-10-CM | POA: Diagnosis not present

## 2016-06-25 DIAGNOSIS — M25561 Pain in right knee: Secondary | ICD-10-CM | POA: Diagnosis not present

## 2016-06-25 DIAGNOSIS — G8929 Other chronic pain: Secondary | ICD-10-CM | POA: Diagnosis not present

## 2016-08-09 DIAGNOSIS — M4726 Other spondylosis with radiculopathy, lumbar region: Secondary | ICD-10-CM | POA: Diagnosis not present

## 2016-08-09 DIAGNOSIS — M5441 Lumbago with sciatica, right side: Secondary | ICD-10-CM | POA: Diagnosis not present

## 2016-08-09 DIAGNOSIS — M5416 Radiculopathy, lumbar region: Secondary | ICD-10-CM | POA: Diagnosis not present

## 2016-08-10 DIAGNOSIS — Z1231 Encounter for screening mammogram for malignant neoplasm of breast: Secondary | ICD-10-CM | POA: Diagnosis not present

## 2016-08-23 DIAGNOSIS — M4726 Other spondylosis with radiculopathy, lumbar region: Secondary | ICD-10-CM | POA: Diagnosis not present

## 2016-08-23 DIAGNOSIS — M5416 Radiculopathy, lumbar region: Secondary | ICD-10-CM | POA: Diagnosis not present

## 2016-09-02 ENCOUNTER — Encounter: Payer: Self-pay | Admitting: Obstetrics & Gynecology

## 2016-09-15 DIAGNOSIS — D2361 Other benign neoplasm of skin of right upper limb, including shoulder: Secondary | ICD-10-CM | POA: Diagnosis not present

## 2016-09-15 DIAGNOSIS — L905 Scar conditions and fibrosis of skin: Secondary | ICD-10-CM | POA: Diagnosis not present

## 2016-09-15 DIAGNOSIS — D1801 Hemangioma of skin and subcutaneous tissue: Secondary | ICD-10-CM | POA: Diagnosis not present

## 2016-09-15 DIAGNOSIS — L570 Actinic keratosis: Secondary | ICD-10-CM | POA: Diagnosis not present

## 2016-09-15 DIAGNOSIS — L821 Other seborrheic keratosis: Secondary | ICD-10-CM | POA: Diagnosis not present

## 2016-09-15 DIAGNOSIS — L438 Other lichen planus: Secondary | ICD-10-CM | POA: Diagnosis not present

## 2016-09-20 DIAGNOSIS — Z471 Aftercare following joint replacement surgery: Secondary | ICD-10-CM | POA: Diagnosis not present

## 2016-09-20 DIAGNOSIS — Z96612 Presence of left artificial shoulder joint: Secondary | ICD-10-CM | POA: Diagnosis not present

## 2016-10-22 DIAGNOSIS — Z961 Presence of intraocular lens: Secondary | ICD-10-CM | POA: Diagnosis not present

## 2016-10-22 DIAGNOSIS — H52203 Unspecified astigmatism, bilateral: Secondary | ICD-10-CM | POA: Diagnosis not present

## 2016-12-21 DIAGNOSIS — Z6830 Body mass index (BMI) 30.0-30.9, adult: Secondary | ICD-10-CM | POA: Diagnosis not present

## 2016-12-21 DIAGNOSIS — I10 Essential (primary) hypertension: Secondary | ICD-10-CM | POA: Diagnosis not present

## 2016-12-21 DIAGNOSIS — M4800 Spinal stenosis, site unspecified: Secondary | ICD-10-CM | POA: Diagnosis not present

## 2016-12-21 DIAGNOSIS — M1990 Unspecified osteoarthritis, unspecified site: Secondary | ICD-10-CM | POA: Diagnosis not present

## 2016-12-21 DIAGNOSIS — K648 Other hemorrhoids: Secondary | ICD-10-CM | POA: Diagnosis not present

## 2016-12-21 DIAGNOSIS — E784 Other hyperlipidemia: Secondary | ICD-10-CM | POA: Diagnosis not present

## 2016-12-21 DIAGNOSIS — E663 Overweight: Secondary | ICD-10-CM | POA: Diagnosis not present

## 2017-02-11 DIAGNOSIS — G8929 Other chronic pain: Secondary | ICD-10-CM | POA: Diagnosis not present

## 2017-02-11 DIAGNOSIS — M5441 Lumbago with sciatica, right side: Secondary | ICD-10-CM | POA: Diagnosis not present

## 2017-02-11 DIAGNOSIS — M4726 Other spondylosis with radiculopathy, lumbar region: Secondary | ICD-10-CM | POA: Diagnosis not present

## 2017-02-17 DIAGNOSIS — R000 Tachycardia, unspecified: Secondary | ICD-10-CM | POA: Diagnosis not present

## 2017-02-17 DIAGNOSIS — F418 Other specified anxiety disorders: Secondary | ICD-10-CM | POA: Diagnosis not present

## 2017-02-17 DIAGNOSIS — I10 Essential (primary) hypertension: Secondary | ICD-10-CM | POA: Diagnosis not present

## 2017-02-17 DIAGNOSIS — Z6830 Body mass index (BMI) 30.0-30.9, adult: Secondary | ICD-10-CM | POA: Diagnosis not present

## 2017-02-21 DIAGNOSIS — M5416 Radiculopathy, lumbar region: Secondary | ICD-10-CM | POA: Diagnosis not present

## 2017-02-23 DIAGNOSIS — M5136 Other intervertebral disc degeneration, lumbar region: Secondary | ICD-10-CM | POA: Diagnosis not present

## 2017-02-23 DIAGNOSIS — M5441 Lumbago with sciatica, right side: Secondary | ICD-10-CM | POA: Diagnosis not present

## 2017-02-23 DIAGNOSIS — G8929 Other chronic pain: Secondary | ICD-10-CM | POA: Diagnosis not present

## 2017-02-23 DIAGNOSIS — M5416 Radiculopathy, lumbar region: Secondary | ICD-10-CM | POA: Diagnosis not present

## 2017-02-24 DIAGNOSIS — M5416 Radiculopathy, lumbar region: Secondary | ICD-10-CM | POA: Diagnosis not present

## 2017-02-28 DIAGNOSIS — M5416 Radiculopathy, lumbar region: Secondary | ICD-10-CM | POA: Diagnosis not present

## 2017-03-03 DIAGNOSIS — M5416 Radiculopathy, lumbar region: Secondary | ICD-10-CM | POA: Diagnosis not present

## 2017-03-05 DIAGNOSIS — Z23 Encounter for immunization: Secondary | ICD-10-CM | POA: Diagnosis not present

## 2017-03-07 DIAGNOSIS — M5416 Radiculopathy, lumbar region: Secondary | ICD-10-CM | POA: Diagnosis not present

## 2017-03-10 DIAGNOSIS — M5416 Radiculopathy, lumbar region: Secondary | ICD-10-CM | POA: Diagnosis not present

## 2017-03-14 DIAGNOSIS — M5416 Radiculopathy, lumbar region: Secondary | ICD-10-CM | POA: Diagnosis not present

## 2017-03-18 ENCOUNTER — Telehealth: Payer: Self-pay | Admitting: Obstetrics & Gynecology

## 2017-03-18 ENCOUNTER — Ambulatory Visit: Payer: PPO | Admitting: Obstetrics & Gynecology

## 2017-03-18 NOTE — Telephone Encounter (Signed)
Left patient a message to call back to reschedule a future appointment that was cancelled by the provider for an AEX. °

## 2017-03-23 DIAGNOSIS — Z96612 Presence of left artificial shoulder joint: Secondary | ICD-10-CM | POA: Diagnosis not present

## 2017-03-23 DIAGNOSIS — Z471 Aftercare following joint replacement surgery: Secondary | ICD-10-CM | POA: Diagnosis not present

## 2017-03-25 ENCOUNTER — Ambulatory Visit (INDEPENDENT_AMBULATORY_CARE_PROVIDER_SITE_OTHER): Payer: PPO | Admitting: Obstetrics & Gynecology

## 2017-03-25 ENCOUNTER — Encounter: Payer: Self-pay | Admitting: Obstetrics & Gynecology

## 2017-03-25 VITALS — BP 130/70 | HR 102 | Resp 16 | Ht 61.5 in | Wt 175.0 lb

## 2017-03-25 DIAGNOSIS — Z01419 Encounter for gynecological examination (general) (routine) without abnormal findings: Secondary | ICD-10-CM | POA: Diagnosis not present

## 2017-03-25 NOTE — Progress Notes (Signed)
82 y.o. G1P1 WidowedCaucasianF here for annual exam.  Having sciatica issues and having injection placed next week with Dr. Nelva Bush.    PCP:  Dr. Reynaldo Minium.  Has appt in January with him.  Blood work this year was all normal.  Patient's last menstrual period was 06/08/1983.          Sexually active: No.  The current method of family planning is post menopausal status.    Exercising: No.  The patient does not participate in regular exercise at present. Smoker:  no  Health Maintenance: Pap:  03/08/16 Neg   03/02/13 Neg  History of abnormal Pap: no MMG:  08/10/16 BIRADS1:neg  Colonoscopy:  04/14/10 f/u 5 years  BMD:   06/18/13 normal.  No longer needed. TDaP:  Current with PCP Pneumonia vaccine(s):  Done Zostavax:   Done  Hep C testing: N/A Screening Labs: PCP   reports that she has never smoked. She has never used smokeless tobacco. She reports that she does not drink alcohol or use drugs.  Past Medical History:  Diagnosis Date  . Arthritis   . History of blood transfusion    as an infant  . History of hiatal hernia   . History of pneumonia   . Hyperlipidemia   . Hypertension   . Renal cyst     Past Surgical History:  Procedure Laterality Date  . COLONOSCOPY    . DILATION AND CURETTAGE OF UTERUS     x2  . EYE SURGERY     cataracts  . HIP SURGERY  5/05   right hip replacement  . HIP SURGERY  5/11   left hip replacement  . RHINOPLASTY    . TONSILLECTOMY    . 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 ALPRAZolam (XANAX) 0.5 MG tablet Take 0.5 mg by mouth daily as needed for anxiety.    Marland Kitchen amLODipine (NORVASC) 5 MG tablet Take 5 mg by mouth 2 (two) times daily.     Marland Kitchen aspirin 81 MG tablet Take 81 mg by mouth daily.    . Aspirin-Caffeine (ANACIN PO) Take 2 tablets by mouth  2 (two) times daily as needed (pain).    Marland Kitchen atenolol (TENORMIN) 50 MG tablet Take 50 mg by mouth daily.    Marland Kitchen atorvastatin (LIPITOR) 10 MG tablet Take 10 mg by mouth daily.    . benazepril (LOTENSIN) 10 MG tablet Take 10 mg by mouth 2 (two) times daily.     . Famotidine (PEPCID PO) Take 1 tablet by mouth daily as needed (heartburn).     . Multiple Vitamins-Minerals (ICAPS) CAPS Take 1 capsule by mouth daily.     . Potassium 99 MG TABS Take 99 mg by mouth daily.     No current facility-administered medications for this visit.     Family History  Problem Relation Age of Onset  . Heart disease Mother     ROS:  Pertinent items are noted in HPI.  Otherwise, a comprehensive ROS was negative.  Exam:   BP 130/70 (BP Location: Right Arm, Patient Position: Sitting, Cuff Size: Large)   Pulse (!) 102   Resp 16   Ht 5' 1.5" (1.562 m)   Wt 175 lb (79.4 kg)   LMP 06/08/1983   BMI 32.53 kg/m   Weight change: +12#  Height: 5' 1.5" (156.2 cm)  Ht Readings from Last 3 Encounters:  03/25/17 5' 1.5" (1.562 m)  03/24/16 5' 1.5" (1.562 m)  03/08/16 5' 1.25" (1.556 m)    General appearance: alert, cooperative and appears stated age Head: Normocephalic, without obvious abnormality, atraumatic Neck: no adenopathy, supple, symmetrical, trachea midline and thyroid normal to inspection and palpation Lungs: clear to auscultation bilaterally Breasts: normal appearance, no masses or tenderness Heart: regular rate and rhythm Abdomen: soft, non-tender; bowel sounds normal; no masses,  no organomegaly Extremities: extremities normal, atraumatic, no cyanosis or edema Skin: Skin color, texture, turgor normal. No rashes or lesions Lymph nodes: Cervical, supraclavicular, and axillary nodes normal. No abnormal inguinal nodes palpated Neurologic: Grossly normal   Pelvic: External genitalia:  no lesions              Urethra:  normal appearing urethra with no masses, tenderness or lesions               Bartholins and Skenes: normal                 Vagina: normal appearing vagina with normal color and discharge, no lesions              Cervix: no lesions              Pap taken: No. Bimanual Exam:  Uterus:  normal size, contour, position, consistency, mobility, non-tender              Adnexa: normal adnexa and no mass, fullness, tenderness               Rectovaginal: Confirms               Anus:  normal sphincter tone, no lesions  Chaperone was present for exam.  A:  Well Woman with normal exam PMP, no HRT H/o bilateral hip replacements  Hypertension Elevated cholesterol  P:   Mammogram guidelines reviewed.  Doing yearly. pap smear not obtained.  Neg 2017. Lab work and vaccines are UTD.   return annually or prn

## 2017-03-29 DIAGNOSIS — M5416 Radiculopathy, lumbar region: Secondary | ICD-10-CM | POA: Diagnosis not present

## 2017-03-29 DIAGNOSIS — M5441 Lumbago with sciatica, right side: Secondary | ICD-10-CM | POA: Diagnosis not present

## 2017-04-18 DIAGNOSIS — G8929 Other chronic pain: Secondary | ICD-10-CM | POA: Diagnosis not present

## 2017-04-18 DIAGNOSIS — M5441 Lumbago with sciatica, right side: Secondary | ICD-10-CM | POA: Diagnosis not present

## 2017-04-18 DIAGNOSIS — M5136 Other intervertebral disc degeneration, lumbar region: Secondary | ICD-10-CM | POA: Diagnosis not present

## 2017-04-18 DIAGNOSIS — M5416 Radiculopathy, lumbar region: Secondary | ICD-10-CM | POA: Diagnosis not present

## 2017-06-16 DIAGNOSIS — I10 Essential (primary) hypertension: Secondary | ICD-10-CM | POA: Diagnosis not present

## 2017-06-16 DIAGNOSIS — R82998 Other abnormal findings in urine: Secondary | ICD-10-CM | POA: Diagnosis not present

## 2017-06-16 DIAGNOSIS — E7849 Other hyperlipidemia: Secondary | ICD-10-CM | POA: Diagnosis not present

## 2017-06-22 DIAGNOSIS — I10 Essential (primary) hypertension: Secondary | ICD-10-CM | POA: Diagnosis not present

## 2017-06-22 DIAGNOSIS — M1990 Unspecified osteoarthritis, unspecified site: Secondary | ICD-10-CM | POA: Diagnosis not present

## 2017-06-22 DIAGNOSIS — M79662 Pain in left lower leg: Secondary | ICD-10-CM | POA: Diagnosis not present

## 2017-06-22 DIAGNOSIS — Z6830 Body mass index (BMI) 30.0-30.9, adult: Secondary | ICD-10-CM | POA: Diagnosis not present

## 2017-06-23 ENCOUNTER — Other Ambulatory Visit (HOSPITAL_COMMUNITY): Payer: Self-pay | Admitting: Internal Medicine

## 2017-06-23 ENCOUNTER — Ambulatory Visit (HOSPITAL_COMMUNITY)
Admission: RE | Admit: 2017-06-23 | Discharge: 2017-06-23 | Disposition: A | Discharge status: Home / Self Care | Payer: PPO | Source: Ambulatory Visit | Attending: Vascular Surgery | Admitting: Vascular Surgery

## 2017-06-23 DIAGNOSIS — M79662 Pain in left lower leg: Secondary | ICD-10-CM

## 2017-06-23 DIAGNOSIS — M79605 Pain in left leg: Secondary | ICD-10-CM | POA: Diagnosis not present

## 2017-07-01 DIAGNOSIS — M1990 Unspecified osteoarthritis, unspecified site: Secondary | ICD-10-CM | POA: Diagnosis not present

## 2017-07-01 DIAGNOSIS — M4800 Spinal stenosis, site unspecified: Secondary | ICD-10-CM | POA: Diagnosis not present

## 2017-07-01 DIAGNOSIS — Z0000 Encounter for general adult medical examination without abnormal findings: Secondary | ICD-10-CM | POA: Diagnosis not present

## 2017-07-01 DIAGNOSIS — I10 Essential (primary) hypertension: Secondary | ICD-10-CM | POA: Diagnosis not present

## 2017-07-01 DIAGNOSIS — Z1389 Encounter for screening for other disorder: Secondary | ICD-10-CM | POA: Diagnosis not present

## 2017-07-01 DIAGNOSIS — E7849 Other hyperlipidemia: Secondary | ICD-10-CM | POA: Diagnosis not present

## 2017-07-01 DIAGNOSIS — Z6829 Body mass index (BMI) 29.0-29.9, adult: Secondary | ICD-10-CM | POA: Diagnosis not present

## 2017-07-01 DIAGNOSIS — F419 Anxiety disorder, unspecified: Secondary | ICD-10-CM | POA: Diagnosis not present

## 2017-08-11 DIAGNOSIS — Z1231 Encounter for screening mammogram for malignant neoplasm of breast: Secondary | ICD-10-CM | POA: Diagnosis not present

## 2017-08-16 ENCOUNTER — Encounter: Payer: Self-pay | Admitting: Obstetrics & Gynecology

## 2017-08-18 DIAGNOSIS — M1712 Unilateral primary osteoarthritis, left knee: Secondary | ICD-10-CM | POA: Diagnosis not present

## 2017-08-18 DIAGNOSIS — M170 Bilateral primary osteoarthritis of knee: Secondary | ICD-10-CM | POA: Diagnosis not present

## 2017-08-18 DIAGNOSIS — Z96643 Presence of artificial hip joint, bilateral: Secondary | ICD-10-CM | POA: Diagnosis not present

## 2017-08-18 DIAGNOSIS — M1711 Unilateral primary osteoarthritis, right knee: Secondary | ICD-10-CM | POA: Diagnosis not present

## 2017-09-15 DIAGNOSIS — D1801 Hemangioma of skin and subcutaneous tissue: Secondary | ICD-10-CM | POA: Diagnosis not present

## 2017-09-15 DIAGNOSIS — D0471 Carcinoma in situ of skin of right lower limb, including hip: Secondary | ICD-10-CM | POA: Diagnosis not present

## 2017-09-15 DIAGNOSIS — L720 Epidermal cyst: Secondary | ICD-10-CM | POA: Diagnosis not present

## 2017-09-15 DIAGNOSIS — L570 Actinic keratosis: Secondary | ICD-10-CM | POA: Diagnosis not present

## 2017-09-15 DIAGNOSIS — D225 Melanocytic nevi of trunk: Secondary | ICD-10-CM | POA: Diagnosis not present

## 2017-09-15 DIAGNOSIS — D485 Neoplasm of uncertain behavior of skin: Secondary | ICD-10-CM | POA: Diagnosis not present

## 2017-09-15 DIAGNOSIS — D2361 Other benign neoplasm of skin of right upper limb, including shoulder: Secondary | ICD-10-CM | POA: Diagnosis not present

## 2017-09-15 DIAGNOSIS — L821 Other seborrheic keratosis: Secondary | ICD-10-CM | POA: Diagnosis not present

## 2017-11-07 ENCOUNTER — Emergency Department (HOSPITAL_COMMUNITY): Payer: PPO

## 2017-11-07 ENCOUNTER — Other Ambulatory Visit: Payer: Self-pay

## 2017-11-07 ENCOUNTER — Emergency Department (HOSPITAL_COMMUNITY)
Admission: EM | Admit: 2017-11-07 | Discharge: 2017-11-07 | Disposition: A | Discharge status: Home / Self Care | Payer: PPO | Attending: Emergency Medicine | Admitting: Emergency Medicine

## 2017-11-07 ENCOUNTER — Encounter (HOSPITAL_COMMUNITY): Payer: Self-pay | Admitting: Emergency Medicine

## 2017-11-07 DIAGNOSIS — I959 Hypotension, unspecified: Secondary | ICD-10-CM | POA: Diagnosis not present

## 2017-11-07 DIAGNOSIS — M25522 Pain in left elbow: Secondary | ICD-10-CM | POA: Insufficient documentation

## 2017-11-07 DIAGNOSIS — Y92008 Other place in unspecified non-institutional (private) residence as the place of occurrence of the external cause: Secondary | ICD-10-CM | POA: Insufficient documentation

## 2017-11-07 DIAGNOSIS — Z96642 Presence of left artificial hip joint: Secondary | ICD-10-CM | POA: Diagnosis not present

## 2017-11-07 DIAGNOSIS — S0181XA Laceration without foreign body of other part of head, initial encounter: Secondary | ICD-10-CM | POA: Diagnosis not present

## 2017-11-07 DIAGNOSIS — R0902 Hypoxemia: Secondary | ICD-10-CM | POA: Diagnosis not present

## 2017-11-07 DIAGNOSIS — S42302A Unspecified fracture of shaft of humerus, left arm, initial encounter for closed fracture: Secondary | ICD-10-CM | POA: Diagnosis not present

## 2017-11-07 DIAGNOSIS — W19XXXA Unspecified fall, initial encounter: Secondary | ICD-10-CM

## 2017-11-07 DIAGNOSIS — Z23 Encounter for immunization: Secondary | ICD-10-CM | POA: Insufficient documentation

## 2017-11-07 DIAGNOSIS — Z7982 Long term (current) use of aspirin: Secondary | ICD-10-CM | POA: Diagnosis not present

## 2017-11-07 DIAGNOSIS — Y9301 Activity, walking, marching and hiking: Secondary | ICD-10-CM | POA: Diagnosis not present

## 2017-11-07 DIAGNOSIS — S42402A Unspecified fracture of lower end of left humerus, initial encounter for closed fracture: Secondary | ICD-10-CM | POA: Diagnosis not present

## 2017-11-07 DIAGNOSIS — S0990XA Unspecified injury of head, initial encounter: Secondary | ICD-10-CM | POA: Diagnosis not present

## 2017-11-07 DIAGNOSIS — Z96612 Presence of left artificial shoulder joint: Secondary | ICD-10-CM | POA: Diagnosis not present

## 2017-11-07 DIAGNOSIS — Z79899 Other long term (current) drug therapy: Secondary | ICD-10-CM | POA: Diagnosis not present

## 2017-11-07 DIAGNOSIS — S42352A Displaced comminuted fracture of shaft of humerus, left arm, initial encounter for closed fracture: Secondary | ICD-10-CM

## 2017-11-07 DIAGNOSIS — I10 Essential (primary) hypertension: Secondary | ICD-10-CM | POA: Diagnosis not present

## 2017-11-07 DIAGNOSIS — W01190A Fall on same level from slipping, tripping and stumbling with subsequent striking against furniture, initial encounter: Secondary | ICD-10-CM | POA: Diagnosis not present

## 2017-11-07 DIAGNOSIS — S199XXA Unspecified injury of neck, initial encounter: Secondary | ICD-10-CM | POA: Diagnosis not present

## 2017-11-07 DIAGNOSIS — Y999 Unspecified external cause status: Secondary | ICD-10-CM | POA: Insufficient documentation

## 2017-11-07 DIAGNOSIS — S0993XA Unspecified injury of face, initial encounter: Secondary | ICD-10-CM | POA: Diagnosis present

## 2017-11-07 LAB — CBC WITH DIFFERENTIAL/PLATELET
BASOS ABS: 0 10*3/uL (ref 0.0–0.1)
BASOS PCT: 0 %
EOS PCT: 0 %
Eosinophils Absolute: 0 10*3/uL (ref 0.0–0.7)
HCT: 40.8 % (ref 36.0–46.0)
HEMOGLOBIN: 14.1 g/dL (ref 12.0–15.0)
LYMPHS ABS: 1.3 10*3/uL (ref 0.7–4.0)
Lymphocytes Relative: 5 %
MCH: 31.7 pg (ref 26.0–34.0)
MCHC: 34.6 g/dL (ref 30.0–36.0)
MCV: 91.7 fL (ref 78.0–100.0)
Monocytes Absolute: 1.9 10*3/uL — ABNORMAL HIGH (ref 0.1–1.0)
Monocytes Relative: 8 %
NEUTROS PCT: 87 %
Neutro Abs: 21.2 10*3/uL — ABNORMAL HIGH (ref 1.7–7.7)
Platelets: 300 10*3/uL (ref 150–400)
RBC: 4.45 MIL/uL (ref 3.87–5.11)
RDW: 13 % (ref 11.5–15.5)
WBC: 24.4 10*3/uL — AB (ref 4.0–10.5)

## 2017-11-07 LAB — BASIC METABOLIC PANEL
Anion gap: 11 (ref 5–15)
BUN: 23 mg/dL — AB (ref 6–20)
CHLORIDE: 104 mmol/L (ref 101–111)
CO2: 21 mmol/L — AB (ref 22–32)
Calcium: 9.2 mg/dL (ref 8.9–10.3)
Creatinine, Ser: 0.7 mg/dL (ref 0.44–1.00)
GFR calc Af Amer: >60 mL/min (ref >60)
GFR calc non Af Amer: >60 mL/min (ref >60)
GLUCOSE: 175 mg/dL — AB (ref 65–99)
POTASSIUM: 3.9 mmol/L (ref 3.5–5.1)
Sodium: 136 mmol/L (ref 135–145)

## 2017-11-07 MED ORDER — MORPHINE SULFATE (PF) 4 MG/ML IV SOLN
4.0000 mg | Freq: Once | INTRAVENOUS | Status: AC
  Administered 2017-11-07: 4 mg via INTRAVENOUS
  Filled 2017-11-07: qty 1

## 2017-11-07 MED ORDER — SODIUM CHLORIDE 0.9 % IV BOLUS
500.0000 mL | Freq: Once | INTRAVENOUS | Status: AC
  Administered 2017-11-07: 500 mL via INTRAVENOUS

## 2017-11-07 MED ORDER — OXYCODONE-ACETAMINOPHEN 5-325 MG PO TABS: 1.0000 | ORAL_TABLET | Freq: Four times a day (QID) | ORAL | 0 refills | Status: DC | PRN

## 2017-11-07 MED ORDER — TETANUS-DIPHTH-ACELL PERTUSSIS 5-2.5-18.5 LF-MCG/0.5 IM SUSP
0.5000 mL | Freq: Once | INTRAMUSCULAR | Status: AC
  Administered 2017-11-07: 0.5 mL via INTRAMUSCULAR
  Filled 2017-11-07: qty 0.5

## 2017-11-07 MED ORDER — LIDOCAINE-EPINEPHRINE (PF) 2 %-1:200000 IJ SOLN
10.0000 mL | Freq: Once | INTRAMUSCULAR | Status: AC
  Administered 2017-11-07: 10 mL
  Filled 2017-11-07: qty 20

## 2017-11-07 MED ORDER — ONDANSETRON HCL 4 MG PO TABS: 4.0000 mg | ORAL_TABLET | Freq: Three times a day (TID) | ORAL | 0 refills | Status: DC | PRN

## 2017-11-07 NOTE — ED Notes (Signed)
Removed IV from R wrist.  Catheter intact and dressing was clean, dry, and intact.

## 2017-11-07 NOTE — Discharge Instructions (Signed)
You were seen here today for fall Your CT scan of your head and neck were reassuring.  You required 7 sutures to close the wound that was found on your forehead. Please see attached handout on care for this. You will need to follow up here, urgent care, your PCP in 7 days to have the sutures removed.  You were found to have a comminuted fracture of the mid to distal humerus. This was placed in a splint and I spoke with Dr. Wynelle Link recommended that you follow-up in the office this week.  Please call the office to have follow-up.  He recommended you see either Dr. Amedeo Plenty or Dr. Apolonio Schneiders.  If your hands become numb or tingling, appear gray or blue, or you have severe pain please loosen the splint and return to the department.   For breakthrough pain you may take Percocet. Do not drink alcohol drive or operate heavy machinery when taking. You are being provided a prescription for opiates (also known as narcotics) for pain control on an ?as needed? basis.  Opiates can be addictive and should only be used when absolutely necessary for pain control when other alternatives do not work.  We recommend you only use them for the recommended amount of time and only as prescribed.  Please do not take with other sedative medications or alcohol.  Please do not drive, operate machinery, or make important decisions while taking opiates.  Please note that these medications can be addictive and have high abuse potential.  Please keep these medications locked away from children, teenagers or any family members with history of substance abuse. Additionally, these medications may cause constipation - take over the counter stool softeners or add fiber to your diet to treat this (Metamucil, Psyllium Fiber, Colace, Miralax) Further refills will need to be obtained from your primary care doctor and will not be prescribed through the Emergency Department. You will test positive on most drug tests while taking this medication.   If you  develop worsening or new concerning symptoms you can return to the emergency department for re-evaluation.

## 2017-11-07 NOTE — ED Triage Notes (Signed)
Per EMS-tripped over rug on patio falling face first into glass table-laceration to right forehead to right bridge of nose-closed left humeral fracture-no LOC, no blood thinners-18g right wrist-50 mcg of Fentanyl given in route-soft splint to left arm

## 2017-11-07 NOTE — ED Provider Notes (Signed)
[facility] DEPT Provider Note   CSN: [id number] Arrival date & time: 11/07/17  1530     History   Chief Complaint Chief Complaint  Patient presents with  . Fall    HPI Lauren Hill is a 83 y.o. female with a history of hypertension and hyperlipidemia who presents emergency department today for fall.  Patient reports that approximately 1 hour ago she was going outside onto her porch when she did not realize her rug had folded on itself and she tripped over this.  She reports a mechanical fall where she hit the glass table across the front of her forehead.  She is noted to have a large gash between her eyebrows that is approximately 5 cm in length vertically.  She denies any loss of consciousness.  She reports she was not able to ambulate after the event secondary to severe pain in her left upper arm.  She was splinted by EMS and brought in for further evaluation.  She received 50 mcg of fentanyl on route that has subsided the patient's pain.  She denies any headache, visual changes, numbness/tingling/weakness of the extremities, bowel/bladder incontinence, neck pain, chest pain, shortness of breath, abdominal pain.  She denies open wounds of the left arm.  She is unsure of her last tetanus status.  Patient is on a daily 81 mg aspirin.  No other blood thinners.  HPI  Past Medical History:  Diagnosis Date  . Arthritis   . History of blood transfusion    as an infant  . History of hiatal hernia   . History of pneumonia   . Hyperlipidemia   . Hypertension   . Renal cyst     Patient Active Problem List   Diagnosis Date Noted  . 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 Essential hypertension 03/22/2014  . Elevated lipids 03/22/2014  . Microscopic hematuria 03/22/2014    Past Surgical History:  Procedure Laterality Date  . COLONOSCOPY    . DILATION AND CURETTAGE OF UTERUS     x2  . EYE SURGERY     cataracts  . HIP SURGERY  5/05   right  hip replacement  . HIP SURGERY  5/11   left hip replacement  . RHINOPLASTY    . TONSILLECTOMY    . TOTAL SHOULDER ARTHROPLASTY Left 04/01/2016   Procedure: LEFT TOTAL SHOULDER ARTHROPLASTY;  Surgeon: Justice Britain, MD;  Location: [facility];  Service: Orthopedics;  Laterality: Left;     OB History    Gravida  1   Para  1   Term      Preterm      AB      Living  1     SAB      TAB      Ectopic      Multiple      Live Births               Home Medications    Prior to Admission medications   Medication Sig Start Date End Date Taking? Authorizing Provider  ALPRAZolam Duanne Moron) 0.5 MG tablet Take 0.5 mg by mouth daily as needed for anxiety.    [provider]  amLODipine (NORVASC) 5 MG tablet Take 5 mg by mouth 2 (two) times daily.  01/12/16   [provider]  aspirin 81 MG tablet Take 81 mg by mouth daily.    [provider]  Aspirin-Caffeine (ANACIN PO) Take 2 tablets by mouth 2 (two) times  daily as needed (pain).    [provider]  atenolol (TENORMIN) 50 MG tablet Take 50 mg by mouth daily.    [provider]  atorvastatin (LIPITOR) 10 MG tablet Take 10 mg by mouth daily.    [provider]  benazepril (LOTENSIN) 10 MG tablet Take 10 mg by mouth 2 (two) times daily.  01/12/16   [provider]  Famotidine (PEPCID PO) Take 1 tablet by mouth daily as needed (heartburn).     [provider]  Multiple Vitamins-Minerals (ICAPS) CAPS Take 1 capsule by mouth daily.     [provider]  Potassium 99 MG TABS Take 99 mg by mouth daily.    [provider]    Family History Family History  Problem Relation Age of Onset  . Heart disease Mother     Social History Social History   Tobacco Use  . Smoking status: Never Smoker  . Smokeless tobacco: Never Used  Substance Use Topics  . Alcohol use: No  . Drug use: No     Allergies   Patient has no known allergies.   Review of  Systems Review of Systems  All other systems reviewed and are negative.    Physical Exam Updated Vital Signs BP (!) 152/64 (BP Location: Right Arm)   Pulse 97   Temp 98.2 F (36.8 C) (Oral)   Resp 18   Ht 5' 1.5" (1.562 m)   Wt 79.4 kg (175 lb)   LMP 06/08/1983   SpO2 97%   BMI 32.53 kg/m   Physical Exam  Constitutional: She appears well-developed and well-nourished.  HENT:  Head: Normocephalic. Head is without raccoon's eyes and without Battle's sign.    Right Ear: External ear normal.  Left Ear: External ear normal.  Nose: Nose normal.  Mouth/Throat: Uvula is midline, oropharynx is clear and moist and mucous membranes are normal. No tonsillar exudate.  Eyes: Pupils are equal, round, and reactive to light. Right eye exhibits no discharge. Left eye exhibits no discharge. No scleral icterus.  Neck: Trachea normal. Neck supple. No spinous process tenderness present. No neck rigidity. Normal range of motion present.  No C-Spine TTP.   Cardiovascular: Normal rate, regular rhythm and intact distal pulses.  No murmur heard. Pulses:      Radial pulses are 2+ on the right side, and 2+ on the left side.       Dorsalis pedis pulses are 2+ on the right side, and 2+ on the left side.       Posterior tibial pulses are 2+ on the right side, and 2+ on the left side.  No lower extremity swelling or edema. Calves symmetric in size bilaterally.  Pulmonary/Chest: Effort normal and breath sounds normal. She exhibits no tenderness.  Abdominal: Soft. Bowel sounds are normal. There is no tenderness. There is no rebound and no guarding.  Musculoskeletal: She exhibits no edema.       Left shoulder: She exhibits normal range of motion and no tenderness.       Left wrist: Normal.       Left hand: Normal. Normal sensation noted. Normal strength noted.  Left arm is splinted in a soft cast by EMS.  This was removed.  No open wounds visualized.  Patient with deformity and swelling of the lower portion  of humerus.  Significant tenderness palpation of the distal humerus as well as left elbow.  Compartments are soft.  She is neurovascular intact distally.  Lymphadenopathy:  She has no cervical adenopathy.  Neurological: She is alert.  Speech clear. Follows commands. No facial droop. PERRLA. EOM grossly intact. CN III-XII grossly intact. Grossly moves all extremities 4 without ataxia. Able and appropriate strength for age to upper and lower extremities bilaterally including grip strength, plantar flexion/dorsiflexion., Normal sensation to lower and upper extremities.   Skin: Skin is warm and dry. Capillary refill takes less than 2 seconds. Laceration noted. No rash noted. She is not diaphoretic.  Psychiatric: She has a normal mood and affect.  Nursing note and vitals reviewed.    ED Treatments / Results  Labs (all labs ordered are listed, but only abnormal results are displayed) Labs Reviewed  BASIC METABOLIC PANEL - Abnormal; Notable for the following components:      Result Value   CO2 21 (*)    Glucose, Bld 175 (*)    BUN 23 (*)    All other components within normal limits  CBC WITH DIFFERENTIAL/PLATELET - Abnormal; Notable for the following components:   WBC 24.4 (*)    Neutro Abs 21.2 (*)    Monocytes Absolute 1.9 (*)    All other components within normal limits    EKG None  Radiology Ct Head Wo Contrast  Result Date: 11/07/2017 CLINICAL DATA:  Tripped over rug on patio and fell face first into a glass table lacerating RIGHT forehead and bridge of nose, LEFT humeral fracture EXAM: CT HEAD WITHOUT CONTRAST CT CERVICAL SPINE WITHOUT CONTRAST TECHNIQUE: Multidetector CT imaging of the head and cervical spine was performed following the standard protocol without intravenous contrast. Multiplanar CT image reconstructions of the cervical spine were also generated. COMPARISON:  None FINDINGS: CT HEAD FINDINGS Brain: Generalized atrophy. Normal ventricular morphology. No midline shift  or mass effect. Otherwise normal appearance of brain parenchyma. No intracranial hemorrhage, mass lesion, or evidence of acute infarction. Vascular: Atherosclerotic calcifications of internal carotid and vertebral arteries at skull base Skull: Demineralized but intact. Frontal soft tissue swelling and scalp laceration. Soft tissue swelling at nose. Sinuses/Orbits: Paranasal sinuses and mastoid air cells clear. Intraorbital soft tissue planes clear. Other: N/A CT CERVICAL SPINE FINDINGS Alignment: Normal Skull base and vertebrae: Diffuse osseous demineralization. Multilevel facet degenerative changes bilaterally. Ankylosis of LEFT C4-C5 facet joint. Vertebral body heights maintained. No fracture, subluxation or bone destruction. Visualized skull base intact. Soft tissues and spinal canal: Prevertebral soft tissues normal thickness. Atherosclerotic calcifications throughout the carotid systems bilaterally especially the bifurcations. Disc levels: Disc space narrowing with endplate spur formation and discogenic sclerosis at C5-C6 and C6-C7. Additional disc space narrowing C4-C5. Upper chest: Lung apices clear Other: N/A IMPRESSION: No acute intracranial abnormalities. Scattered degenerative disc and facet disease changes of the cervical spine. No acute cervical spine abnormalities. Scattered significant atherosclerotic plaque formation within the carotid systems. Electronically Signed   By: Lavonia Dana M.D.   On: 11/07/2017 17:05   Ct Cervical Spine Wo Contrast  Result Date: 11/07/2017 CLINICAL DATA:  Tripped over rug on patio and fell face first into a glass table lacerating RIGHT forehead and bridge of nose, LEFT humeral fracture EXAM: CT HEAD WITHOUT CONTRAST CT CERVICAL SPINE WITHOUT CONTRAST TECHNIQUE: Multidetector CT imaging of the head and cervical spine was performed following the standard protocol without intravenous contrast. Multiplanar CT image reconstructions of the cervical spine were also generated.  COMPARISON:  None FINDINGS: CT HEAD FINDINGS Brain: Generalized atrophy. Normal ventricular morphology. No midline shift or mass effect. Otherwise normal appearance of brain parenchyma. No intracranial hemorrhage, mass lesion,  or evidence of acute infarction. Vascular: Atherosclerotic calcifications of internal carotid and vertebral arteries at skull base Skull: Demineralized but intact. Frontal soft tissue swelling and scalp laceration. Soft tissue swelling at nose. Sinuses/Orbits: Paranasal sinuses and mastoid air cells clear. Intraorbital soft tissue planes clear. Other: N/A CT CERVICAL SPINE FINDINGS Alignment: Normal Skull base and vertebrae: Diffuse osseous demineralization. Multilevel facet degenerative changes bilaterally. Ankylosis of LEFT C4-C5 facet joint. Vertebral body heights maintained. No fracture, subluxation or bone destruction. Visualized skull base intact. Soft tissues and spinal canal: Prevertebral soft tissues normal thickness. Atherosclerotic calcifications throughout the carotid systems bilaterally especially the bifurcations. Disc levels: Disc space narrowing with endplate spur formation and discogenic sclerosis at C5-C6 and C6-C7. Additional disc space narrowing C4-C5. Upper chest: Lung apices clear Other: N/A IMPRESSION: No acute intracranial abnormalities. Scattered degenerative disc and facet disease changes of the cervical spine. No acute cervical spine abnormalities. Scattered significant atherosclerotic plaque formation within the carotid systems. Electronically Signed   By: Lavonia Dana M.D.   On: 11/07/2017 17:05   Dg Humerus Left  Result Date: 11/07/2017 CLINICAL DATA:  Trip and fall with left arm pain, initial encounter EXAM: LEFT HUMERUS - 2+ VIEW COMPARISON:  None. FINDINGS: Comminuted fracture of the mid to distal humerus is noted with mild angulation at the fracture site. Shoulder replacement is noted on the left. No other focal abnormality is seen. IMPRESSION: Comminuted  fracture of the mid to distal humerus. Electronically Signed   By: Inez Catalina M.D.   On: 11/07/2017 16:46    Procedures .Marland KitchenLaceration Repair Date/Time: 11/07/2017 6:36 PM Performed by: Jillyn Ledger, PA-C Authorized by: Jillyn Ledger, PA-C   Consent:    Consent obtained:  Verbal   Consent given by:  Patient   Risks discussed:  Infection, need for additional repair, nerve damage, poor wound healing, poor cosmetic result, pain, retained foreign body, tendon damage and vascular damage   Alternatives discussed:  No treatment Anesthesia (see MAR for exact dosages):    Anesthesia method:  Local infiltration   Local anesthetic:  Lidocaine 2% WITH epi Laceration details:    Location:  Face   Face location:  Forehead   Length (cm):  5 Repair type:    Repair type:  Simple Pre-procedure details:    Preparation:  Patient was prepped and draped in usual sterile fashion and imaging obtained to evaluate for foreign bodies Exploration:    Hemostasis achieved with:  Direct pressure   Wound exploration: wound explored through full range of motion and entire depth of wound probed and visualized   Treatment:    Area cleansed with:  Shur-Clens and saline   Amount of cleaning:  Standard   Irrigation solution:  Sterile water   Irrigation volume:  1000   Irrigation method:  Syringe   Visualized foreign bodies/material removed: no   Skin repair:    Repair method:  Sutures   Suture size:  6-0   Wound skin closure material used: Ethilon.   Suture technique:  Simple interrupted   Number of sutures:  7 Approximation:    Approximation:  Close Post-procedure details:    Dressing:  Open (no dressing)   Patient tolerance of procedure:  Tolerated well, no immediate complications   (including critical care time)  SPLINT APPLICATION Date/Time: 2:42 PM Authorized by: Jillyn Ledger Consent: Verbal consent obtained. Risks and benefits: risks, benefits and alternatives were discussed Consent  given by: patient Splint applied by: orthopedic technician Location details: left arm Splint  type: posterior long arm splint Supplies used: posterior long arm splint  Post-procedure: The splinted body part was neurovascularly unchanged following the procedure. Patient tolerance: Patient tolerated the procedure well with no immediate complications.  Medications Ordered in ED Medications  Tdap (BOOSTRIX) injection 0.5 mL (0.5 mLs Intramuscular Given 11/07/17 1658)  lidocaine-EPINEPHrine (XYLOCAINE W/EPI) 2 %-1:200000 (PF) injection 10 mL (10 mLs Infiltration Given 11/07/17 1856)  morphine 4 MG/ML injection 4 mg (4 mg Intravenous Given 11/07/17 1740)  sodium chloride 0.9 % bolus 500 mL (0 mLs Intravenous Stopped 11/07/17 1954)     Initial Impression / Assessment and Plan / ED Course  I have reviewed the triage vital signs and the nursing notes.  Pertinent labs & imaging results that were available during my care of the patient were reviewed by me and considered in my medical decision making (see chart for details).     83 y.o. female with mechanical fall earlier this evening who fell and hit her head on glass table and suffered 5cm laceration to the forehead as well as left arm upper arm injury.  Patient's neuro exam is reassuring.  CT of the head and neck without evidence of acute intracranial abnormalities or acute cervical spine abnormalities.  Screening labs drawn with suspicion of left upper arm fracture.  These are reassuring.  Left humeral x-ray with comminuted, displaced fracture of the mid/distal aspect of the humerus.  There is a closed fracture.  Compartments are soft.  She is neurovascular intact.  Patient is followed by [facility].  I spoke with Dr. Wynelle Link who recommended long-arm splint with shoulder laser and outpatient follow-up with Dr. Amedeo Plenty or Apolonio Schneiders.  Patient with long arm splint applied in department. Initially patient reported numbness and tingling of the hand after  splint application.  Compartments remain soft.  Ortho tech came and loosen the splint that is resolved patient's symptoms.  She remains neurovascular intact and compartments remain soft.  Will prescribe the patient a short course of pain medication and have patient follow-up with me as per orthopedics this week for further evaluation. In regards to patients laceration. Tetanus updated. Laceration occurred < 12 hours prior to repair. Discussed laceration care with pt and answered questions. Pt to f-u for suture removal in 7 days and wound check sooner should there be signs of dehiscence or infection. After pain medication patient did have episode of hypotension in the department. This improved after IVF.   The evaluation does not show pathology that would require ongoing emergent intervention or inpatient treatment. I advised the patient to follow-up with PCP/here/urgent care in 1 week for suture removal  this week. She is to call [facility] for follow up and schedule an appointment tomorrow morning . I advised the patient to return to the emergency department with new or worsening symptoms or new concerns. Specific return precautions discussed. The patient verbalized understanding and agreement with plan. All questions answered. No further questions at this time. The patient is hemodynamically stable, mentating appropriately and appears safe for discharge.  Final Clinical Impressions(s) / ED Diagnoses   Final diagnoses:  Fall, initial encounter  Facial laceration, initial encounter  Closed displaced comminuted fracture of shaft of left humerus, initial encounter    ED Discharge Orders        Ordered    oxyCODONE-acetaminophen (PERCOCET/ROXICET) 5-325 MG tablet  Every 6 hours PRN     11/07/17 2014    ondansetron (ZOFRAN) 4 MG tablet  Every 8 hours PRN  11/07/17 2014       Jillyn Ledger, PA-C 11/07/17 2023    Sherwood Gambler, MD 11/09/17 [phone]

## 2017-11-07 NOTE — ED Notes (Signed)
Bed: UD31 Expected date:  Expected time:  Means of arrival:  Comments: EMS-fall/head lac-possible fracture

## 2017-11-09 DIAGNOSIS — S42402A Unspecified fracture of lower end of left humerus, initial encounter for closed fracture: Secondary | ICD-10-CM | POA: Diagnosis not present

## 2017-11-10 ENCOUNTER — Other Ambulatory Visit: Payer: Self-pay

## 2017-11-10 ENCOUNTER — Encounter (HOSPITAL_COMMUNITY): Payer: Self-pay | Admitting: *Deleted

## 2017-11-10 NOTE — Anesthesia Preprocedure Evaluation (Addendum)
Anesthesia Evaluation  Patient identified by MRN, date of birth, ID band Patient awake    Reviewed: Allergy & Precautions, NPO status , Patient's Chart, lab work & pertinent test results  Airway Mallampati: II  TM Distance: >3 FB Neck ROM: Full    Dental no notable dental hx. (+) Teeth Intact, Dental Advisory Given   Pulmonary neg pulmonary ROS,    Pulmonary exam normal breath sounds clear to auscultation       Cardiovascular hypertension, Pt. on medications and Pt. on home beta blockers Normal cardiovascular exam Rhythm:Regular Rate:Normal     Neuro/Psych negative neurological ROS  negative psych ROS   GI/Hepatic Neg liver ROS, hiatal hernia,   Endo/Other    Renal/GU      Musculoskeletal   Abdominal   Peds  Hematology negative hematology ROS (+)   Anesthesia Other Findings   Reproductive/Obstetrics                             Lab Results  Component Value Date   CREATININE 0.70 11/07/2017   BUN 23 (H) 11/07/2017   NA 136 11/07/2017   K 3.9 11/07/2017   CL 104 11/07/2017   CO2 21 (L) 11/07/2017    Lab Results  Component Value Date   WBC 24.4 (H) 11/07/2017   HGB 14.1 11/07/2017   HCT 40.8 11/07/2017   MCV 91.7 11/07/2017   PLT 300 11/07/2017    Anesthesia Physical Anesthesia Plan  ASA: II  Anesthesia Plan: General   Post-op Pain Management:    Induction: Intravenous  PONV Risk Score and Plan: Treatment may vary due to age or medical condition, Ondansetron and Dexamethasone  Airway Management Planned: Oral ETT  Additional Equipment:   Intra-op Plan:   Post-operative Plan: Extubation in OR  Informed Consent: I have reviewed the patients History and Physical, chart, labs and discussed the procedure including the risks, benefits and alternatives for the proposed anesthesia with the patient or authorized representative who has indicated his/her understanding and  acceptance.   Dental advisory given  Plan Discussed with: CRNA  Anesthesia Plan Comments:        Anesthesia Quick Evaluation

## 2017-11-10 NOTE — H&P (Signed)
Orthopaedic Trauma Service (OTS) Consult   Patient ID: Lauren Hill MRN: [id number] DOB/AGE: 1935-06-01 83 y.o.     HPI: Lauren Hill is an 83 y.o. right-hand-dominant white  female who sustained a fall on Monday with resultant closed left humerus fracture after tripping over a longer out side her house.  Patient also hit her head in the fall with small laceration.  She was seen and evaluated in the emergency department on 11/07/2017.  Found to have left humerus fracture subsequently referred to orthopedic trauma specialist for definitive management.  Patient was seen and evaluated in the office on 11/09/2017.  We discussed her injury and treatment options.  Patient presents today for surgical fixation of her left humerus fracture.  Patient was splinted in the emergency department and placed in a sling as well.  She has been managing her pain with Percocet which is sufficient.  She lives by herself in a townhouse and is very active   Status post left total shoulder arthroplasty, bilateral hip arthroplasties  Past Medical History:  Diagnosis Date  . Arthritis   . GERD (gastroesophageal reflux disease)    indigestion  . History of blood transfusion    as an infant  . History of hiatal hernia   . History of pneumonia   . Hyperlipidemia   . Hypertension   . Renal cyst     Past Surgical History:  Procedure Laterality Date  . COLONOSCOPY    . DILATION AND CURETTAGE OF UTERUS     x2  . EYE SURGERY     cataracts  . HIP SURGERY  5/05   right hip replacement  . HIP SURGERY  5/11   left hip replacement  . RHINOPLASTY    . TONSILLECTOMY    . TOTAL SHOULDER ARTHROPLASTY Left 04/01/2016   Procedure: LEFT TOTAL SHOULDER ARTHROPLASTY;  Surgeon: Justice Britain, MD;  Location: [place];  Service: Orthopedics;  Laterality: Left;    Family History  Problem Relation Age of Onset  . Heart disease Mother     Social History:  reports that she has never smoked. She has never used  smokeless tobacco. She reports that she does not drink alcohol or use drugs.  Allergies: No Known Allergies  Medications:  Current Meds  Medication Sig  . acetaminophen (TYLENOL) 650 MG CR tablet Take 1,300 mg by mouth 2 (two) times daily.  Marland Kitchen amLODipine (NORVASC) 5 MG tablet Take 5 mg by mouth 2 (two) times daily.   Marland Kitchen aspirin 81 MG tablet Take 81 mg by mouth daily.  Marland Kitchen atenolol (TENORMIN) 50 MG tablet Take 50 mg by mouth daily.  Marland Kitchen atorvastatin (LIPITOR) 10 MG tablet Take 10 mg by mouth daily at 6 PM.   . benazepril (LOTENSIN) 10 MG tablet Take 10 mg by mouth 2 (two) times daily.   . famotidine (PEPCID) 10 MG tablet Take 10 mg by mouth at bedtime as needed for heartburn or indigestion.  . magnesium gluconate (MAGONATE) 500 MG tablet Take 500 mg by mouth at bedtime.  . multivitamin-lutein (OCUVITE-LUTEIN) CAPS capsule Take 1 capsule by mouth daily.  . ondansetron (ZOFRAN) 4 MG tablet Take 1 tablet (4 mg total) by mouth every 8 (eight) hours as needed for nausea or vomiting.  Marland Kitchen oxyCODONE-acetaminophen (PERCOCET/ROXICET) 5-325 MG tablet Take 1 tablet by mouth every 6 (six) hours as needed for severe pain.  Marland Kitchen Potassium 99 MG TABS Take 99 mg by mouth at bedtime.  . [DISCONTINUED] Famotidine (PEPCID PO) Take 1 tablet  by mouth daily as needed (heartburn).     Labs pending  Review of Systems  Constitutional: Negative for chills and fever.  Respiratory: Negative for shortness of breath and wheezing.   Cardiovascular: Negative for chest pain and palpitations.  Gastrointestinal: Negative for nausea and vomiting.  Neurological: Negative for sensory change.     Last menstrual period 06/08/1983. Physical Exam  Constitutional: Vital signs are normal. She appears well-developed and well-nourished. She is cooperative.  Pleasant white female, no acute distress  HENT:  Laceration to forehead Periorbital ecchymosis  Cardiovascular: Normal rate, regular rhythm, S1 normal and S2 normal.   Pulmonary/Chest: No respiratory distress.  CTA bilaterally  Abdominal: Soft. Bowel sounds are normal. She exhibits no distension. There is no tenderness.  Musculoskeletal:  Left upper extremity Patient is in a posterior long-arm splint Splint is fitting well Extremity is warm Minimal swelling distally No skin irritation or pressure sores proximally distally to the splint Radial, ulnar, median nerve motor and sensory functions are intact AIN and PIN motor functions are intact Soft tissue will be evaluated in the operating room No pain with passive stretching of her fingers.  She is nontender palpation of her clavicle and shoulder   Neurological: She is alert.  Psychiatric: She has a normal mood and affect. Her speech is normal and behavior is normal. Cognition and memory are normal.     Assessment/Plan:  83 year old female ground-level fall with extra-articular left distal humerus fracture with stable left total shoulder arthroplasty  -Left extra-articular distal humerus fracture  OR for ORIF left humerus to restore stability length and alignment  Admit to the hospital postoperatively for pain control and therapies  Would anticipate a several day stay with likely discharge home with supervision from family  Patient will be unrestricted in terms of range of motion postoperatively to her elbow     Risks and benefits of surgery reviewed with the patient and family.  They wish to proceed with surgical intervention  - Pain management:  Minimize narcotics as much as possible  Multimodal analgesia   - Medical issues   Home medications  - DVT/PE prophylaxis:  Will likely cover with Lovenox while inpatient but will not place on pharmacologic since discharge as it is an upper extremity injury and patient is mobile  - ID:   Perioperative antibiotics  - Metabolic Bone Disease:  Check labs for metabolic bone disease  Suspect some degree of poor bone density  Recommend outpatient  DEXA scan  - Dispo:  OR for ORIF left humerus with admission postoperatively    Jari Pigg, PA-C [facility] [phone] (C) [phone] (O) 11/10/2017, 9:53 PM

## 2017-11-10 NOTE — Progress Notes (Signed)
Spoke with pt for pre-op call. Pt denies cardiac history, chest pain or sob. Pt states she is not diabetic.  

## 2017-11-11 ENCOUNTER — Inpatient Hospital Stay (HOSPITAL_COMMUNITY): Payer: PPO | Admitting: Anesthesiology

## 2017-11-11 ENCOUNTER — Inpatient Hospital Stay (HOSPITAL_COMMUNITY): Payer: PPO

## 2017-11-11 ENCOUNTER — Encounter (HOSPITAL_COMMUNITY)
Admission: RE | Disposition: A | Discharge status: Home Health | Payer: Self-pay | Source: Ambulatory Visit | Attending: Student

## 2017-11-11 ENCOUNTER — Observation Stay (HOSPITAL_COMMUNITY): Payer: PPO

## 2017-11-11 ENCOUNTER — Inpatient Hospital Stay (HOSPITAL_COMMUNITY)
Admission: RE | Admit: 2017-11-11 | Discharge: 2017-11-13 | DRG: 494 | Disposition: A | Discharge status: Home Health | Payer: PPO | Source: Ambulatory Visit | Attending: Student | Admitting: Student

## 2017-11-11 ENCOUNTER — Encounter (HOSPITAL_COMMUNITY): Payer: Self-pay

## 2017-11-11 DIAGNOSIS — Z96643 Presence of artificial hip joint, bilateral: Secondary | ICD-10-CM | POA: Diagnosis not present

## 2017-11-11 DIAGNOSIS — I10 Essential (primary) hypertension: Secondary | ICD-10-CM | POA: Diagnosis not present

## 2017-11-11 DIAGNOSIS — Z01811 Encounter for preprocedural respiratory examination: Secondary | ICD-10-CM

## 2017-11-11 DIAGNOSIS — S42422A Displaced comminuted supracondylar fracture without intercondylar fracture of left humerus, initial encounter for closed fracture: Principal | ICD-10-CM | POA: Diagnosis present

## 2017-11-11 DIAGNOSIS — S42352A Displaced comminuted fracture of shaft of humerus, left arm, initial encounter for closed fracture: Secondary | ICD-10-CM | POA: Diagnosis present

## 2017-11-11 DIAGNOSIS — E785 Hyperlipidemia, unspecified: Secondary | ICD-10-CM | POA: Diagnosis present

## 2017-11-11 DIAGNOSIS — Z96612 Presence of left artificial shoulder joint: Secondary | ICD-10-CM | POA: Diagnosis present

## 2017-11-11 DIAGNOSIS — W010XXA Fall on same level from slipping, tripping and stumbling without subsequent striking against object, initial encounter: Secondary | ICD-10-CM | POA: Diagnosis present

## 2017-11-11 DIAGNOSIS — S0181XA Laceration without foreign body of other part of head, initial encounter: Secondary | ICD-10-CM | POA: Diagnosis present

## 2017-11-11 DIAGNOSIS — K219 Gastro-esophageal reflux disease without esophagitis: Secondary | ICD-10-CM | POA: Diagnosis present

## 2017-11-11 DIAGNOSIS — T148XXA Other injury of unspecified body region, initial encounter: Secondary | ICD-10-CM

## 2017-11-11 DIAGNOSIS — J9811 Atelectasis: Secondary | ICD-10-CM | POA: Diagnosis not present

## 2017-11-11 DIAGNOSIS — Z419 Encounter for procedure for purposes other than remedying health state, unspecified: Secondary | ICD-10-CM

## 2017-11-11 DIAGNOSIS — S42412A Displaced simple supracondylar fracture without intercondylar fracture of left humerus, initial encounter for closed fracture: Secondary | ICD-10-CM | POA: Diagnosis not present

## 2017-11-11 DIAGNOSIS — M25512 Pain in left shoulder: Secondary | ICD-10-CM | POA: Diagnosis present

## 2017-11-11 DIAGNOSIS — Y92018 Other place in single-family (private) house as the place of occurrence of the external cause: Secondary | ICD-10-CM | POA: Diagnosis not present

## 2017-11-11 HISTORY — PX: ORIF HUMERUS FRACTURE: SHX2126

## 2017-11-11 HISTORY — DX: Gastro-esophageal reflux disease without esophagitis: K21.9

## 2017-11-11 LAB — CBC WITH DIFFERENTIAL/PLATELET
ABS IMMATURE GRANULOCYTES: 0.1 10*3/uL (ref 0.0–0.1)
Basophils Absolute: 0.1 10*3/uL (ref 0.0–0.1)
Basophils Relative: 0 %
Eosinophils Absolute: 0.1 10*3/uL (ref 0.0–0.7)
Eosinophils Relative: 1 %
HEMATOCRIT: 35.8 % — AB (ref 36.0–46.0)
HEMOGLOBIN: 11.8 g/dL — AB (ref 12.0–15.0)
Immature Granulocytes: 1 %
LYMPHS ABS: 1.9 10*3/uL (ref 0.7–4.0)
LYMPHS PCT: 13 %
MCH: 31 pg (ref 26.0–34.0)
MCHC: 33 g/dL (ref 30.0–36.0)
MCV: 94 fL (ref 78.0–100.0)
MONOS PCT: 9 %
Monocytes Absolute: 1.3 10*3/uL — ABNORMAL HIGH (ref 0.1–1.0)
NEUTROS ABS: 10.7 10*3/uL — AB (ref 1.7–7.7)
Neutrophils Relative %: 76 %
Platelets: 264 10*3/uL (ref 150–400)
RBC: 3.81 MIL/uL — ABNORMAL LOW (ref 3.87–5.11)
RDW: 12.6 % (ref 11.5–15.5)
WBC: 14.1 10*3/uL — ABNORMAL HIGH (ref 4.0–10.5)

## 2017-11-11 LAB — PROTIME-INR
INR: 1.01
PROTHROMBIN TIME: 13.2 s (ref 11.4–15.2)

## 2017-11-11 LAB — URINALYSIS, ROUTINE W REFLEX MICROSCOPIC
Bilirubin Urine: NEGATIVE
Glucose, UA: NEGATIVE mg/dL
KETONES UR: NEGATIVE mg/dL
LEUKOCYTES UA: NEGATIVE
Nitrite: NEGATIVE
PROTEIN: NEGATIVE mg/dL
Specific Gravity, Urine: 1.012 (ref 1.005–1.030)
pH: 6 (ref 5.0–8.0)

## 2017-11-11 LAB — COMPREHENSIVE METABOLIC PANEL
ALK PHOS: 73 U/L (ref 38–126)
ALT: 18 U/L (ref 14–54)
AST: 19 U/L (ref 15–41)
Albumin: 3.3 g/dL — ABNORMAL LOW (ref 3.5–5.0)
Anion gap: 8 (ref 5–15)
BUN: 12 mg/dL (ref 6–20)
CALCIUM: 8.9 mg/dL (ref 8.9–10.3)
CO2: 22 mmol/L (ref 22–32)
Chloride: 107 mmol/L (ref 101–111)
Creatinine, Ser: 0.67 mg/dL (ref 0.44–1.00)
Glucose, Bld: 107 mg/dL — ABNORMAL HIGH (ref 65–99)
Potassium: 3.8 mmol/L (ref 3.5–5.1)
Sodium: 137 mmol/L (ref 135–145)
Total Bilirubin: 0.8 mg/dL (ref 0.3–1.2)
Total Protein: 6.3 g/dL — ABNORMAL LOW (ref 6.5–8.1)

## 2017-11-11 LAB — MAGNESIUM: Magnesium: 2 mg/dL (ref 1.7–2.4)

## 2017-11-11 LAB — TSH: TSH: 3.144 u[IU]/mL (ref 0.350–4.500)

## 2017-11-11 LAB — PHOSPHORUS: PHOSPHORUS: 4.2 mg/dL (ref 2.5–4.6)

## 2017-11-11 LAB — PREALBUMIN: Prealbumin: 24.8 mg/dL (ref 18–38)

## 2017-11-11 LAB — APTT: aPTT: 30 seconds (ref 24–36)

## 2017-11-11 SURGERY — OPEN REDUCTION INTERNAL FIXATION (ORIF) DISTAL HUMERUS FRACTURE
Anesthesia: General | Site: Arm Upper | Laterality: Left

## 2017-11-11 MED ORDER — BUPIVACAINE HCL (PF) 0.5 % IJ SOLN
INTRAMUSCULAR | Status: DC | PRN
  Administered 2017-11-11: 30 mL

## 2017-11-11 MED ORDER — PROPOFOL 10 MG/ML IV BOLUS
INTRAVENOUS | Status: DC | PRN
  Administered 2017-11-11: 150 mg via INTRAVENOUS

## 2017-11-11 MED ORDER — VANCOMYCIN HCL 1000 MG IV SOLR
INTRAVENOUS | Status: DC | PRN
  Administered 2017-11-11: 1000 mg

## 2017-11-11 MED ORDER — HYDROMORPHONE HCL 2 MG/ML IJ SOLN
0.5000 mg | INTRAMUSCULAR | Status: AC | PRN
  Administered 2017-11-11 (×4): 0.5 mg via INTRAVENOUS

## 2017-11-11 MED ORDER — FENTANYL CITRATE (PF) 250 MCG/5ML IJ SOLN
INTRAMUSCULAR | Status: AC
  Filled 2017-11-11: qty 5

## 2017-11-11 MED ORDER — DEXAMETHASONE SODIUM PHOSPHATE 10 MG/ML IJ SOLN
INTRAMUSCULAR | Status: AC
  Filled 2017-11-11: qty 1

## 2017-11-11 MED ORDER — ENOXAPARIN SODIUM 40 MG/0.4ML ~~LOC~~ SOLN
40.0000 mg | SUBCUTANEOUS | Status: DC
  Administered 2017-11-11 – 2017-11-12 (×2): 40 mg via SUBCUTANEOUS
  Filled 2017-11-11 (×2): qty 0.4

## 2017-11-11 MED ORDER — LIDOCAINE 2% (20 MG/ML) 5 ML SYRINGE
INTRAMUSCULAR | Status: AC
  Filled 2017-11-11: qty 5

## 2017-11-11 MED ORDER — 0.9 % SODIUM CHLORIDE (POUR BTL) OPTIME
TOPICAL | Status: DC | PRN
  Administered 2017-11-11: 1000 mL

## 2017-11-11 MED ORDER — OXYCODONE-ACETAMINOPHEN 5-325 MG PO TABS
1.0000 | ORAL_TABLET | ORAL | Status: DC | PRN
  Administered 2017-11-11 – 2017-11-13 (×5): 1 via ORAL
  Filled 2017-11-11 (×5): qty 1

## 2017-11-11 MED ORDER — ONDANSETRON HCL 4 MG/2ML IJ SOLN
INTRAMUSCULAR | Status: AC
  Filled 2017-11-11: qty 2

## 2017-11-11 MED ORDER — CLONIDINE HCL 0.2 MG PO TABS
ORAL_TABLET | ORAL | Status: AC
  Filled 2017-11-11: qty 1

## 2017-11-11 MED ORDER — GLYCOPYRROLATE PF 0.2 MG/ML IJ SOSY
PREFILLED_SYRINGE | INTRAMUSCULAR | Status: DC | PRN
  Administered 2017-11-11 (×2): .2 mg via INTRAVENOUS

## 2017-11-11 MED ORDER — ALBUMIN HUMAN 5 % IV SOLN
INTRAVENOUS | Status: AC
  Administered 2017-11-11: 12.5 g via INTRAVENOUS
  Filled 2017-11-11: qty 250

## 2017-11-11 MED ORDER — CEFAZOLIN SODIUM-DEXTROSE 2-4 GM/100ML-% IV SOLN
2.0000 g | Freq: Three times a day (TID) | INTRAVENOUS | Status: AC
  Administered 2017-11-11 – 2017-11-12 (×3): 2 g via INTRAVENOUS
  Filled 2017-11-11 (×3): qty 100

## 2017-11-11 MED ORDER — ONDANSETRON HCL 4 MG/2ML IJ SOLN
INTRAMUSCULAR | Status: DC | PRN
  Administered 2017-11-11: 4 mg via INTRAVENOUS

## 2017-11-11 MED ORDER — CHLORHEXIDINE GLUCONATE 4 % EX LIQD: 60.0000 mL | Freq: Once | CUTANEOUS | Status: DC

## 2017-11-11 MED ORDER — ROCURONIUM BROMIDE 10 MG/ML (PF) SYRINGE
PREFILLED_SYRINGE | INTRAVENOUS | Status: DC | PRN
  Administered 2017-11-11: 60 mg via INTRAVENOUS

## 2017-11-11 MED ORDER — EPHEDRINE SULFATE 50 MG/ML IJ SOLN
INTRAMUSCULAR | Status: DC | PRN
  Administered 2017-11-11 (×2): 5 mg via INTRAVENOUS

## 2017-11-11 MED ORDER — BACITRACIN ZINC 500 UNIT/GM EX OINT
TOPICAL_OINTMENT | CUTANEOUS | Status: AC
  Filled 2017-11-11: qty 28.35

## 2017-11-11 MED ORDER — PHENYLEPHRINE 40 MCG/ML (10ML) SYRINGE FOR IV PUSH (FOR BLOOD PRESSURE SUPPORT)
PREFILLED_SYRINGE | INTRAVENOUS | Status: DC | PRN
  Administered 2017-11-11: 80 ug via INTRAVENOUS
  Administered 2017-11-11: 120 ug via INTRAVENOUS

## 2017-11-11 MED ORDER — ACETAMINOPHEN 500 MG PO TABS: 1000.0000 mg | ORAL_TABLET | Freq: Once | ORAL | Status: DC

## 2017-11-11 MED ORDER — EPHEDRINE SULFATE 50 MG/ML IJ SOLN
INTRAMUSCULAR | Status: AC
  Filled 2017-11-11: qty 3

## 2017-11-11 MED ORDER — CLONIDINE HCL 0.2 MG PO TABS
0.2000 mg | ORAL_TABLET | Freq: Once | ORAL | Status: AC
  Administered 2017-11-11: 0.2 mg via ORAL

## 2017-11-11 MED ORDER — METHOCARBAMOL 500 MG PO TABS
500.0000 mg | ORAL_TABLET | Freq: Four times a day (QID) | ORAL | Status: DC | PRN
  Administered 2017-11-11 – 2017-11-13 (×4): 500 mg via ORAL
  Filled 2017-11-11 (×4): qty 1

## 2017-11-11 MED ORDER — HYDROMORPHONE HCL 2 MG/ML IJ SOLN
INTRAMUSCULAR | Status: AC
  Administered 2017-11-11: 0.5 mg via INTRAVENOUS
  Filled 2017-11-11: qty 1

## 2017-11-11 MED ORDER — HYDROMORPHONE HCL 2 MG/ML IJ SOLN: 1.0000 mg | INTRAMUSCULAR | Status: DC | PRN

## 2017-11-11 MED ORDER — PHENYLEPHRINE HCL 10 MG/ML IJ SOLN
INTRAMUSCULAR | Status: DC | PRN
  Administered 2017-11-11: 20 ug/min via INTRAVENOUS

## 2017-11-11 MED ORDER — FENTANYL CITRATE (PF) 250 MCG/5ML IJ SOLN
INTRAMUSCULAR | Status: DC | PRN
  Administered 2017-11-11: 50 ug via INTRAVENOUS

## 2017-11-11 MED ORDER — ACETAMINOPHEN 500 MG PO TABS
1000.0000 mg | ORAL_TABLET | Freq: Once | ORAL | Status: AC
  Administered 2017-11-11: 1000 mg via ORAL
  Filled 2017-11-11: qty 2

## 2017-11-11 MED ORDER — MIDAZOLAM HCL 2 MG/2ML IJ SOLN
INTRAMUSCULAR | Status: AC
  Filled 2017-11-11: qty 2

## 2017-11-11 MED ORDER — VANCOMYCIN HCL 1000 MG IV SOLR
INTRAVENOUS | Status: AC
Start: 2017-11-11 — End: ?
  Filled 2017-11-11: qty 1000

## 2017-11-11 MED ORDER — GABAPENTIN 100 MG PO CAPS
100.0000 mg | ORAL_CAPSULE | Freq: Three times a day (TID) | ORAL | Status: DC
  Administered 2017-11-11 – 2017-11-13 (×5): 100 mg via ORAL
  Filled 2017-11-11 (×5): qty 1

## 2017-11-11 MED ORDER — SCOPOLAMINE 1 MG/3DAYS TD PT72
MEDICATED_PATCH | TRANSDERMAL | Status: AC
  Filled 2017-11-11: qty 1

## 2017-11-11 MED ORDER — LIDOCAINE 2% (20 MG/ML) 5 ML SYRINGE
INTRAMUSCULAR | Status: DC | PRN
  Administered 2017-11-11: 60 mg via INTRAVENOUS

## 2017-11-11 MED ORDER — SUGAMMADEX SODIUM 200 MG/2ML IV SOLN
INTRAVENOUS | Status: DC | PRN
  Administered 2017-11-11: 159.6 mg via INTRAVENOUS

## 2017-11-11 MED ORDER — BUPIVACAINE HCL (PF) 0.5 % IJ SOLN
INTRAMUSCULAR | Status: AC
  Filled 2017-11-11: qty 30

## 2017-11-11 MED ORDER — ALBUMIN HUMAN 5 % IV SOLN
12.5000 g | Freq: Once | INTRAVENOUS | Status: AC
  Administered 2017-11-11: 12.5 g via INTRAVENOUS

## 2017-11-11 MED ORDER — DEXAMETHASONE SODIUM PHOSPHATE 10 MG/ML IJ SOLN
INTRAMUSCULAR | Status: DC | PRN
  Administered 2017-11-11: 5 mg via INTRAVENOUS

## 2017-11-11 MED ORDER — MIDAZOLAM HCL 2 MG/2ML IJ SOLN: 2.0000 mg | Freq: Once | INTRAMUSCULAR | Status: DC

## 2017-11-11 MED ORDER — PROPOFOL 10 MG/ML IV BOLUS
INTRAVENOUS | Status: AC
  Filled 2017-11-11: qty 20

## 2017-11-11 MED ORDER — CEFAZOLIN SODIUM-DEXTROSE 2-4 GM/100ML-% IV SOLN
2.0000 g | INTRAVENOUS | Status: AC
  Administered 2017-11-11: 2 g via INTRAVENOUS
  Filled 2017-11-11: qty 100

## 2017-11-11 MED ORDER — BUPIVACAINE HCL (PF) 0.25 % IJ SOLN
INTRAMUSCULAR | Status: AC
  Filled 2017-11-11: qty 30

## 2017-11-11 MED ORDER — SUGAMMADEX SODIUM 200 MG/2ML IV SOLN
INTRAVENOUS | Status: AC
Start: 2017-11-11 — End: ?
  Filled 2017-11-11: qty 2

## 2017-11-11 MED ORDER — METHOCARBAMOL 1000 MG/10ML IJ SOLN
500.0000 mg | Freq: Four times a day (QID) | INTRAVENOUS | Status: DC | PRN
  Filled 2017-11-11: qty 5

## 2017-11-11 MED ORDER — OXYCODONE-ACETAMINOPHEN 5-325 MG PO TABS: 2.0000 | ORAL_TABLET | Freq: Four times a day (QID) | ORAL | Status: DC | PRN

## 2017-11-11 MED ORDER — LACTATED RINGERS IV SOLN
INTRAVENOUS | Status: DC | PRN
  Administered 2017-11-11 (×2): via INTRAVENOUS

## 2017-11-11 MED ORDER — CEFAZOLIN SODIUM 1 G IJ SOLR
INTRAMUSCULAR | Status: AC
  Filled 2017-11-11: qty 50

## 2017-11-11 MED ORDER — ONDANSETRON HCL 4 MG PO TABS: 4.0000 mg | ORAL_TABLET | Freq: Four times a day (QID) | ORAL | Status: DC | PRN

## 2017-11-11 MED ORDER — GABAPENTIN 300 MG PO CAPS
300.0000 mg | ORAL_CAPSULE | Freq: Once | ORAL | Status: AC
  Administered 2017-11-11: 300 mg via ORAL
  Filled 2017-11-11: qty 1

## 2017-11-11 MED ORDER — FAMOTIDINE 20 MG PO TABS
10.0000 mg | ORAL_TABLET | Freq: Every day | ORAL | Status: DC
  Administered 2017-11-11 – 2017-11-12 (×2): 10 mg via ORAL
  Filled 2017-11-11 (×2): qty 1

## 2017-11-11 MED ORDER — ONDANSETRON HCL 4 MG/2ML IJ SOLN: 4.0000 mg | Freq: Four times a day (QID) | INTRAMUSCULAR | Status: DC | PRN

## 2017-11-11 MED ORDER — FENTANYL CITRATE (PF) 100 MCG/2ML IJ SOLN
100.0000 ug | Freq: Once | INTRAMUSCULAR | Status: DC
Start: 2017-11-11 — End: 2017-11-11

## 2017-11-11 MED ORDER — ACETAMINOPHEN 500 MG PO TABS
500.0000 mg | ORAL_TABLET | Freq: Two times a day (BID) | ORAL | Status: DC
  Administered 2017-11-11 – 2017-11-13 (×5): 500 mg via ORAL
  Filled 2017-11-11 (×5): qty 1

## 2017-11-11 SURGICAL SUPPLY — 82 items
BANDAGE ACE 4X5 VEL STRL LF (GAUZE/BANDAGES/DRESSINGS) ×2 IMPLANT
BANDAGE ACE 6X5 VEL STRL LF (GAUZE/BANDAGES/DRESSINGS) ×2 IMPLANT
BIT DRILL 2.5 X LONG (BIT) ×1
BIT DRILL PERC QC 2.8X200 100 (BIT) IMPLANT
BIT DRILL X LONG 2.5 (BIT) IMPLANT
BLADE AVERAGE 25MMX9MM (BLADE)
BLADE AVERAGE 25X9 (BLADE) IMPLANT
BNDG CMPR 9X4 STRL LF SNTH (GAUZE/BANDAGES/DRESSINGS) ×1
BNDG ESMARK 4X9 LF (GAUZE/BANDAGES/DRESSINGS) ×3 IMPLANT
BNDG GAUZE ELAST 4 BULKY (GAUZE/BANDAGES/DRESSINGS) ×6 IMPLANT
BRUSH SCRUB SURG 4.25 DISP (MISCELLANEOUS) ×4 IMPLANT
CORDS BIPOLAR (ELECTRODE) ×3 IMPLANT
COVER SURGICAL LIGHT HANDLE (MISCELLANEOUS) ×4 IMPLANT
DRAIN PENROSE 1/4X12 LTX STRL (WOUND CARE) ×2 IMPLANT
DRAPE C-ARM 42X72 X-RAY (DRAPES) ×3 IMPLANT
DRAPE C-ARMOR (DRAPES) ×2 IMPLANT
DRAPE HALF SHEET 40X57 (DRAPES) ×3 IMPLANT
DRAPE IMP U-DRAPE 54X76 (DRAPES) ×2 IMPLANT
DRAPE INCISE IOBAN 66X45 STRL (DRAPES) ×2 IMPLANT
DRAPE ORTHO SPLIT 77X108 STRL (DRAPES) ×6
DRAPE SURG ORHT 6 SPLT 77X108 (DRAPES) IMPLANT
DRAPE U-SHAPE 47X51 STRL (DRAPES) ×6 IMPLANT
DRILL BIT QUICK COUP 2.8MM 100 (BIT) ×2
DRILL BIT X LONG 2.5 (BIT) ×3
DRSG ADAPTIC 3X8 NADH LF (GAUZE/BANDAGES/DRESSINGS) ×3 IMPLANT
DRSG MEPILEX BORDER 4X12 (GAUZE/BANDAGES/DRESSINGS) ×2 IMPLANT
DRSG PAD ABDOMINAL 8X10 ST (GAUZE/BANDAGES/DRESSINGS) ×3 IMPLANT
ELECT REM PT RETURN 9FT ADLT (ELECTROSURGICAL) ×3
ELECTRODE REM PT RTRN 9FT ADLT (ELECTROSURGICAL) ×1 IMPLANT
EVACUATOR 1/8 PVC DRAIN (DRAIN) IMPLANT
GAUZE SPONGE 4X4 12PLY STRL (GAUZE/BANDAGES/DRESSINGS) ×6 IMPLANT
GLOVE BIO SURGEON STRL SZ7.5 (GLOVE) ×11 IMPLANT
GLOVE BIOGEL PI IND STRL 7.5 (GLOVE) ×1 IMPLANT
GLOVE BIOGEL PI IND STRL 8 (GLOVE) ×1 IMPLANT
GLOVE BIOGEL PI INDICATOR 7.5 (GLOVE) ×2
GLOVE BIOGEL PI INDICATOR 8 (GLOVE) ×2
GLOVE SURG SS PI 6.0 STRL IVOR (GLOVE) ×4 IMPLANT
GOWN STRL REUS W/ TWL LRG LVL3 (GOWN DISPOSABLE) ×2 IMPLANT
GOWN STRL REUS W/ TWL XL LVL3 (GOWN DISPOSABLE) ×1 IMPLANT
GOWN STRL REUS W/TWL LRG LVL3 (GOWN DISPOSABLE) ×6
GOWN STRL REUS W/TWL XL LVL3 (GOWN DISPOSABLE) ×3
K-WIRE 1.6X150 (WIRE) ×3
KIT BASIN OR (CUSTOM PROCEDURE TRAY) ×3 IMPLANT
KIT TURNOVER KIT B (KITS) ×3 IMPLANT
KWIRE 1.6X150 (WIRE) IMPLANT
MANIFOLD NEPTUNE II (INSTRUMENTS) ×3 IMPLANT
NDL HYPO 25X1 1.5 SAFETY (NEEDLE) ×1 IMPLANT
NEEDLE HYPO 25X1 1.5 SAFETY (NEEDLE) ×3 IMPLANT
NS IRRIG 1000ML POUR BTL (IV SOLUTION) ×3 IMPLANT
PACK ORTHO EXTREMITY (CUSTOM PROCEDURE TRAY) ×3 IMPLANT
PAD ARMBOARD 7.5X6 YLW CONV (MISCELLANEOUS) ×6 IMPLANT
PAD CAST 4YDX4 CTTN HI CHSV (CAST SUPPLIES) IMPLANT
PADDING CAST COTTON 4X4 STRL (CAST SUPPLIES) ×3
PADDING CAST COTTON 6X4 STRL (CAST SUPPLIES) ×2 IMPLANT
PLATE LOCK LCP 3.5X266 12H LT (Plate) ×2 IMPLANT
SCREW CORT LP ST 3.5X22 (Screw) ×2 IMPLANT
SCREW CORTEX 3.5 24MM (Screw) ×4 IMPLANT
SCREW LOCK CORT ST 3.5X24 (Screw) IMPLANT
SCREW LOCK T15 FT 18X3.5X2.9X (Screw) IMPLANT
SCREW LOCK T15 FT 22X3.5XST (Screw) IMPLANT
SCREW LOCK T15 FT 24X3.5X2.9X (Screw) IMPLANT
SCREW LOCKING 3.5X18 (Screw) ×6 IMPLANT
SCREW LOCKING 3.5X22 (Screw) ×6 IMPLANT
SCREW LOCKING 3.5X24 (Screw) ×3 IMPLANT
SCREW LOCKING 3.5X26 (Screw) ×2 IMPLANT
SPONGE LAP 18X18 X RAY DECT (DISPOSABLE) ×6 IMPLANT
STAPLER VISISTAT 35W (STAPLE) ×3 IMPLANT
STOCKINETTE IMPERVIOUS 9X36 MD (GAUZE/BANDAGES/DRESSINGS) ×2 IMPLANT
SUCTION FRAZIER HANDLE 10FR (MISCELLANEOUS) ×2
SUCTION TUBE FRAZIER 10FR DISP (MISCELLANEOUS) ×1 IMPLANT
SUT ETHILON 3 0 PS 1 (SUTURE) ×6 IMPLANT
SUT VIC AB 0 CT1 27 (SUTURE) ×6
SUT VIC AB 0 CT1 27XBRD ANBCTR (SUTURE) ×2 IMPLANT
SUT VIC AB 2-0 CT1 27 (SUTURE) ×6
SUT VIC AB 2-0 CT1 TAPERPNT 27 (SUTURE) ×2 IMPLANT
SYR 5ML LL (SYRINGE) IMPLANT
SYR CONTROL 10ML LL (SYRINGE) ×3 IMPLANT
TOWEL OR 17X26 10 PK STRL BLUE (TOWEL DISPOSABLE) ×5 IMPLANT
TRAY FOLEY MTR SLVR 16FR STAT (SET/KITS/TRAYS/PACK) IMPLANT
TUBE CONNECTING 12'X1/4 (SUCTIONS) ×1
TUBE CONNECTING 12X1/4 (SUCTIONS) ×1 IMPLANT
YANKAUER SUCT BULB TIP NO VENT (SUCTIONS) ×2 IMPLANT

## 2017-11-11 NOTE — Transfer of Care (Signed)
Immediate Anesthesia Transfer of Care Note  Patient: Lauren Hill  Procedure(s) Performed: OPEN REDUCTION INTERNAL FIXATION (ORIF) LEFT DISTAL HUMERUS FRACTURE (Left Arm Upper)  Patient Location: PACU  Anesthesia Type:General  Level of Consciousness: drowsy and patient cooperative  Airway & Oxygen Therapy: Patient Spontanous Breathing and Patient connected to face mask oxygen  Post-op Assessment: Report given to RN, Post -op Vital signs reviewed and stable and Patient moving all extremities X 4  Post vital signs: Reviewed and stable  Last Vitals:  Vitals Value Taken Time  BP 94/77 11/11/2017 10:28 AM  Temp    Pulse 82 11/11/2017 10:29 AM  Resp 16 11/11/2017 10:29 AM  SpO2 100 % 11/11/2017 10:29 AM  Vitals shown include unvalidated device data.  Last Pain:  Vitals:   11/11/17 0656  TempSrc:   PainSc: 0-No pain      Patients Stated Pain Goal: 0 (92/01/00 7121)  Complications: No apparent anesthesia complications

## 2017-11-11 NOTE — Anesthesia Postprocedure Evaluation (Signed)
Anesthesia Post Note  Patient: Lauren Hill  Procedure(s) Performed: OPEN REDUCTION INTERNAL FIXATION (ORIF) LEFT DISTAL HUMERUS FRACTURE (Left Arm Upper)     Patient location during evaluation: PACU Anesthesia Type: General Level of consciousness: awake and alert Pain management: pain level controlled Vital Signs Assessment: post-procedure vital signs reviewed and stable Respiratory status: spontaneous breathing, nonlabored ventilation, respiratory function stable and patient connected to nasal cannula oxygen Cardiovascular status: blood pressure returned to baseline and stable Postop Assessment: no apparent nausea or vomiting Anesthetic complications: no    Last Vitals:  Vitals:   11/11/17 1243 11/11/17 1307  BP: (!) 112/55 (!) 124/58  Pulse: 74 71  Resp: 11 14  Temp:  36.9 C  SpO2: 96% 100%    Last Pain:  Vitals:   11/11/17 1307  TempSrc: Axillary  PainSc:                  Barnet Glasgow

## 2017-11-11 NOTE — Op Note (Addendum)
OrthopaedicSurgeryOperativeNote ([id number]) Date of Surgery: 11/11/2017  Admit Date: 11/11/2017   Diagnoses: Pre-Op Diagnoses: Left comminuted distal humeral shaft/supracondylar humerus fracture  Post-Op Diagnosis: Same  Procedures: CPT 24545-Open reduction internal fixation of left distal humerus fracture  Surgeons: Primary: Shona Needles, MD   Assistant: Ainsley Spinner, PA-C  Location:[facility] OR ROOM 03   AnesthesiaGeneral   Antibiotics:Ancef 2g preop   Tourniquettime:None  NFAOZHYQMVHQIONGEX:528 mL   Complications:None  Specimens: None  Implants: Implant Name Type Inv. Item Serial No. Manufacturer Lot No. LRB No. Used Action  3.5 mm LCP EXTRA-ARTICULAR DISTAL HUMERUS PLATE 12H/LT [id number]    DEPUY SYNTHES [id number] Left 1 Implanted  SCREW LOCKING 3.5X18 - [id number] Screw SCREW LOCKING 3.5X18  SYNTHES TRAUMA  Left 2 Implanted  SCREW LOCKING 3.5X22 - [id number] Screw SCREW LOCKING 3.5X22  [employer]ES TRAUMA  Left 2 Implanted  SCREW CORTEX 3.5 22MM - [id number] Screw SCREW CORTEX 3.5 22MM  [employer]ES TRAUMA  Left 1 Implanted  SCREW LOCKING 3.5X24 - [id number] Screw SCREW LOCKING 3.5X24  [employer]ES TRAUMA  Left 1 Implanted  SCREW CORTEX 3.5 24MM - [id number] Screw SCREW CORTEX 3.5 24MM  [employer]ES TRAUMA  Left 2 Implanted  SCREW LOCKING 3.5X26 - [id number] Screw SCREW LOCKING 3.5X26  SYNTHES TRAUMA  Left 1 Implanted    IndicationsforSurgery: Healthy 83 year old female who lives alone sustained a ground-level fall and a left distal humeral shaft fracture.  She was placed in a splint and referred to our office for evaluation and treatment.  I discussed the risks and benefits of proceeding with surgical fixation.  I feel that with her activity level and independence that surgical fixation is most appropriate.  Plan to proceed with ORIF of the distal humerus. Risks discussed included bleeding requiring blood transfusion, bleeding causing a hematoma, infection, malunion, nonunion, damage  to surrounding nerves and blood vessels, pain, hardware prominence or irritation, hardware failure, periprosthetic fracture, stiffness, post-traumatic arthritis, DVT/PE, compartment syndrome, and even death.  Patient agreed to surgery and consent was obtained.  Operative Findings: Open reduction internal fixation of left distal humeral shaft fracture/supracondylar humerus fracture using Synthes extra-articular 12 hole 3.5 LCP plate using a posterior lateral approach to the distal humerus.  Procedure: The patient was identified in the preoperative holding area. Consent was confirmed with the patient and their family and all questions were answered. The operative extremity was marked after confirmation with the patient. The patient was then brought back to the operating room by our anesthesia colleagues.  They were placed under general anesthetic.  There were carefully transferred over to a radiolucent flat top table.  They were subsequently placed in lateral decubitus position with the operative side up.  The bone foam was used to bring the arm over to be able to access the posterior aspect of the humerus and elbow. The operative extremity was then prepped and draped in usual sterile fashion. A preoperative timeout was performed to verify the patient, the procedure, and the extremity. Preoperative antibiotics were dosed.  Fluoroscopy was used to show the displacement of the fracture.  A posterior lateral approach was then made to the distal humerus. I identified the intermuscular septum laterally at the distal aspect of the elbow.  I then extended the incision all the way up as I carefully dissected the triceps off the lateral intermuscular septum.  I identified a branch of the radial nerve and traced this back to the radial nerve proper.  I carefully dissected the radial nerve out and placed a vessel loop around the nerve  to help retracted out of the way.  I continue my dissection up proximally along the  lateral aspect of the triceps until I had exposed the proximal humerus to adequately fix the fracture.  There was significant comminution at the fracture site.  I felt that anatomic reduction and lag fixation would cause too much soft tissue stripping and decrease the chance of the bone healing.  As result I felt that bridge fixation would be most appropriate.  I kept all soft tissue attachments to the fragments.  I attempted to not devitalized them as much as possible.  Once adequate exposure of the humerus was obtained and then chose a Synthes 12 hole LCP 3.5 mm extra-articular distal humerus plate. This was appropriately positioned on the distal aspect of the humerus.  It was clamped in place.  The proximal portion of the plate was used to help reduce the shaft.  Bone reduction forceps were used to hold the plate to the proximal shaft.  A locking screw was placed in the distal aspect of the humerus.  Nonlocking screw was placed in the proximal aspect of the humerus.  Fluoroscopy was used to confirm adequate length and alignment of the fracture.    I used a mixture of nonlocking and locking screws into the proximal shaft segment.  I made sure that the plate was long enough to bridge across the prosthesis to prevent a significant stress riser.  The distal fixation was locking.  Was able to place 2 bicortical locking screws in the distal portion of the humerus.  The remainder had to be unicortical to prevent intra-articular penetration.  Final fluoroscopic images were obtained.  Adequate reduction of the fracture and overall alignment was restored.  The radial nerve proper crossed  just distal to the screw proximal to the comminution and fracture.  The incision was then copiously irrigated 1 g of vancomycin powder was placed in the wounds.  The muscular septum was closed using a #1 Vicryl suture.  The fascia overlying the triceps was closed with interrupted #1 Vicryl sutures.  The incision was then closed  with 2-0 Vicryl and 3-0 nylon.  A sterile dressing consisting of Mepilex sterile cast padding and Ace wraps was used to dress the arm.  The patient was then awoken from anesthesia and taken to the PACU in stable condition.  Post Op Plan/Instructions: The patient will be nonweightbearing to the left upper extremity.  I will allow range of motion of the elbow and shoulder without restriction.  She will be admitted for pain control and observation.  She will mobilize with occupational therapy.  We will plan to change her dressing postoperative day 2.  She will be on Lovenox while in the hospital and no DVT prophylaxis as needed after she is discharged.  I was present and performed the entire surgery.  Ainsley Spinner, PA-C did assist me throughout the case. An assistant was necessary given the difficulty in approach, maintenance of reduction and ability to instrument the fracture.  Katha Hamming, MD [facility]

## 2017-11-11 NOTE — Anesthesia Procedure Notes (Signed)
Procedure Name: Intubation Date/Time: 11/11/2017 8:10 AM Performed by: Freddie Breech, CRNA Pre-anesthesia Checklist: Patient identified, Emergency Drugs available, Suction available and Patient being monitored Patient Re-evaluated:Patient Re-evaluated prior to induction Oxygen Delivery Method: Circle System Utilized Preoxygenation: Pre-oxygenation with 100% oxygen Induction Type: IV induction Ventilation: Mask ventilation without difficulty Laryngoscope Size: Mac and 3 Grade View: Grade II Tube type: Oral Tube size: 7.0 mm Number of attempts: 1 Airway Equipment and Method: Stylet and Oral airway Placement Confirmation: ETT inserted through vocal cords under direct vision,  positive ETCO2 and breath sounds checked- equal and bilateral Secured at: 21 cm Tube secured with: Tape Dental Injury: Teeth and Oropharynx as per pre-operative assessment

## 2017-11-12 DIAGNOSIS — Z96612 Presence of left artificial shoulder joint: Secondary | ICD-10-CM | POA: Diagnosis present

## 2017-11-12 DIAGNOSIS — S0181XA Laceration without foreign body of other part of head, initial encounter: Secondary | ICD-10-CM | POA: Diagnosis present

## 2017-11-12 DIAGNOSIS — Y92018 Other place in single-family (private) house as the place of occurrence of the external cause: Secondary | ICD-10-CM | POA: Diagnosis not present

## 2017-11-12 DIAGNOSIS — E785 Hyperlipidemia, unspecified: Secondary | ICD-10-CM | POA: Diagnosis present

## 2017-11-12 DIAGNOSIS — K219 Gastro-esophageal reflux disease without esophagitis: Secondary | ICD-10-CM | POA: Diagnosis present

## 2017-11-12 DIAGNOSIS — I10 Essential (primary) hypertension: Secondary | ICD-10-CM | POA: Diagnosis present

## 2017-11-12 DIAGNOSIS — W010XXA Fall on same level from slipping, tripping and stumbling without subsequent striking against object, initial encounter: Secondary | ICD-10-CM | POA: Diagnosis present

## 2017-11-12 DIAGNOSIS — S42422A Displaced comminuted supracondylar fracture without intercondylar fracture of left humerus, initial encounter for closed fracture: Secondary | ICD-10-CM | POA: Diagnosis present

## 2017-11-12 DIAGNOSIS — M25512 Pain in left shoulder: Secondary | ICD-10-CM | POA: Diagnosis present

## 2017-11-12 DIAGNOSIS — Z96643 Presence of artificial hip joint, bilateral: Secondary | ICD-10-CM | POA: Diagnosis present

## 2017-11-12 LAB — CBC
HCT: 32.7 % — ABNORMAL LOW (ref 36.0–46.0)
Hemoglobin: 10.6 g/dL — ABNORMAL LOW (ref 12.0–15.0)
MCH: 30.9 pg (ref 26.0–34.0)
MCHC: 32.4 g/dL (ref 30.0–36.0)
MCV: 95.3 fL (ref 78.0–100.0)
PLATELETS: 250 10*3/uL (ref 150–400)
RBC: 3.43 MIL/uL — ABNORMAL LOW (ref 3.87–5.11)
RDW: 12.5 % (ref 11.5–15.5)
WBC: 21.4 10*3/uL — ABNORMAL HIGH (ref 4.0–10.5)

## 2017-11-12 LAB — PTH, INTACT AND CALCIUM
Calcium, Total (PTH): 8.8 mg/dL (ref 8.7–10.3)
PTH: 33 pg/mL (ref 15–65)

## 2017-11-12 LAB — VITAMIN D 25 HYDROXY (VIT D DEFICIENCY, FRACTURES): Vit D, 25-Hydroxy: 22.3 ng/mL — ABNORMAL LOW (ref 30.0–100.0)

## 2017-11-12 MED ORDER — FAMOTIDINE 20 MG PO TABS
10.0000 mg | ORAL_TABLET | Freq: Every evening | ORAL | Status: DC | PRN
Start: 2017-11-12 — End: 2017-11-13

## 2017-11-12 MED ORDER — METHOCARBAMOL 750 MG PO TABS: 750.0000 mg | ORAL_TABLET | Freq: Four times a day (QID) | ORAL | 0 refills | Status: DC

## 2017-11-12 MED ORDER — ATENOLOL 50 MG PO TABS
50.0000 mg | ORAL_TABLET | Freq: Every day | ORAL | Status: DC
  Administered 2017-11-12 – 2017-11-13 (×2): 50 mg via ORAL
  Filled 2017-11-12 (×2): qty 1

## 2017-11-12 MED ORDER — OXYCODONE-ACETAMINOPHEN 5-325 MG PO TABS: 1.0000 | ORAL_TABLET | Freq: Four times a day (QID) | ORAL | 0 refills | Status: DC | PRN

## 2017-11-12 MED ORDER — AMLODIPINE BESYLATE 5 MG PO TABS
5.0000 mg | ORAL_TABLET | Freq: Two times a day (BID) | ORAL | Status: DC
  Administered 2017-11-12 – 2017-11-13 (×3): 5 mg via ORAL
  Filled 2017-11-12 (×3): qty 1

## 2017-11-12 MED ORDER — ATORVASTATIN CALCIUM 10 MG PO TABS
10.0000 mg | ORAL_TABLET | Freq: Every day | ORAL | Status: DC
  Administered 2017-11-12: 10 mg via ORAL
  Filled 2017-11-12: qty 1

## 2017-11-12 MED ORDER — ASPIRIN EC 81 MG PO TBEC
81.0000 mg | DELAYED_RELEASE_TABLET | Freq: Every day | ORAL | Status: DC
  Administered 2017-11-12 – 2017-11-13 (×2): 81 mg via ORAL
  Filled 2017-11-12 (×3): qty 1

## 2017-11-12 MED ORDER — POTASSIUM 99 MG PO TABS: 99.0000 mg | ORAL_TABLET | Freq: Every day | ORAL | Status: DC

## 2017-11-12 MED ORDER — BENAZEPRIL HCL 5 MG PO TABS: 10.0000 mg | ORAL_TABLET | Freq: Two times a day (BID) | ORAL | Status: DC

## 2017-11-12 NOTE — Progress Notes (Signed)
MD called to start patients Famotidine 10 mg at HS for acid reflux new order given will continue to monitor. Arthor Captain LPN

## 2017-11-12 NOTE — Progress Notes (Signed)
Orthopaedic Trauma Progress Note  S: Doing well appearing pain in the arm but it is controlled.  Asking about removing sutures from her forehead.  Denies any shortness of breath or chest pain.  O:  Vitals:   11/11/17 2207 11/12/17 0525  BP: 132/65 (!) 138/57  Pulse: 84 95  Resp: 19 15  Temp: 98.6 F (37 C) (!) 97.4 F (36.3 C)  SpO2: 93% 94%  General: No acute distress awake alert oriented.  Left upper extremity: Reveals dressing that is clean dry and intact.  Gentle range of motion of the elbow is without pain.  She is motor and sensory function intact to median, radial and ulnar nerve distribution warm well-perfused hand with brisk cap refill less than 2 seconds.  Compartments are soft and compressible.  Imaging: Stable postop imaging  Labs:  CBC    Component Value Date/Time   WBC 21.4 (H) 11/12/2017 0343   RBC 3.43 (L) 11/12/2017 0343   HGB 10.6 (L) 11/12/2017 0343   HCT 32.7 (L) 11/12/2017 0343   PLT 250 11/12/2017 0343   MCV 95.3 11/12/2017 0343   MCH 30.9 11/12/2017 0343   MCHC 32.4 11/12/2017 0343   RDW 12.5 11/12/2017 0343   LYMPHSABS 1.9 11/11/2017 0637   MONOABS 1.3 (H) 11/11/2017 0637   EOSABS 0.1 11/11/2017 0637   BASOSABS 0.1 11/11/2017 06313    A/P: 83 year old female status post open reduction internal fixation of left humeral shaft fracture  Restarted home blood pressure medications this morning  Plan to remove forehead sutures.  Start occupational therapy this morning to mobilize patient and move elbow.  Weightbearing: NWB LUE Insicional and dressing care: We will change dressing tomorrow a.m. Orthopedic device(s): Sling as needed Showering: Not yet VTE prophylaxis: Lovenox 40mg  qd Lovenox while in the hospital nothing needed upon discharge Pain control: Percocet and Dilaudid as needed Follow - up plan: 2 weeks  Shona Needles, MD [facility] [phone] (phone)

## 2017-11-12 NOTE — Evaluation (Signed)
Occupational Therapy Evaluation Patient Details Name: Lauren Hill MRN: [id number] DOB: August 24, 1934 Today's Date: 11/12/2017    History of Present Illness 83 year old female status post open reduction internal fixation of left humeral shaft fracture. PHM including right and left THA and left TSA (2017).   Clinical Impression   PTA, pt was living alone and was independent with ADLs, IADLs, and driving. Pt currently requiring Min A for UB ADLs (including sling management), Min Guard A for LB ADLs, and supervision-Min Guard A for functional mobility. Pt reporting that she has friends who can check on her and assist with ADLs and IADLs. Educating pt on compensatory techniques for ADLs and exercises for LUE; pt verbalizing and demonstrating understanding. Pt will require further acute OT to facilitate safe dc. Recommend dc to home with HHOT for further OT to optimize safety, independence with ADLs, and return to PLOF.      Follow Up Recommendations  Home health OT;Supervision/Assistance - 24 hour    Equipment Recommendations  None recommended by OT    Recommendations for Other Services       Precautions / Restrictions Precautions Precautions: Fall;Other (comment) Precaution Comments: Gentle elbow and shoulder ROM Restrictions Weight Bearing Restrictions: Yes LUE Weight Bearing: Non weight bearing Other Position/Activity Restrictions: Gentle elbow and shoulder ROM      Mobility Bed Mobility               General bed mobility comments: OOB in bathroom with NT upon arrival  Transfers Overall transfer level: Needs assistance Equipment used: None Transfers: Sit to/from Stand Sit to Stand: Min guard;Supervision         General transfer comment: MIn GUard for safety initially. Transitioning to supervision    Balance Overall balance assessment: No apparent balance deficits (not formally assessed)                                         ADL either  performed or assessed with clinical judgement   ADL Overall ADL's : Needs assistance/impaired Eating/Feeding: Set up;Sitting   Grooming: Minimal assistance;Sitting;Oral care;Brushing hair;Wash/dry face Grooming Details (indicate cue type and reason): Pt requiring MIn A to open toothpaste due to poor grasp strength. Able to perform majority of grooming tasks at sink with set up Upper Body Bathing: Minimal assistance   Lower Body Bathing: Min guard;Sit to/from stand   Upper Body Dressing : Minimal assistance;Standing Upper Body Dressing Details (indicate cue type and reason): Providing education on compensatory techniuqes for UB dressing. Pt doffing old gown by pulling gown off RUE first. Requiring Min A to don new gown. Min A for adjusting sling and achieving correct position.  Lower Body Dressing: Min guard;Sit to/from stand   Toilet Transfer: Supervision/safety;BSC;Ambulation           Functional mobility during ADLs: Min guard       Vision Baseline Vision/History: Wears glasses Patient Visual Report: No change from baseline       Perception     Praxis      Pertinent Vitals/Pain Pain Assessment: 0-10 Pain Score: 3  Pain Location: LUE; mainly elbow Pain Descriptors / Indicators: Sore Pain Intervention(s): Monitored during session;Repositioned;Limited activity within patient's tolerance;Ice applied     Hand Dominance Right   Extremity/Trunk Assessment Upper Extremity Assessment Upper Extremity Assessment: LUE deficits/detail LUE Deficits / Details: Limited ROM of elbow and shoulder. LUE ace wrapped. Elbow PROM 10-50 degrees.  PROM shoulder 10-90 degrees.  LUE: Unable to fully assess due to immobilization;Unable to fully assess due to pain LUE Coordination: decreased gross motor   Lower Extremity Assessment Lower Extremity Assessment: Overall WFL for tasks assessed   Cervical / Trunk Assessment Cervical / Trunk Assessment: Kyphotic   Communication  Communication Communication: No difficulties   Cognition Arousal/Alertness: Awake/alert Behavior During Therapy: WFL for tasks assessed/performed Overall Cognitive Status: Within Functional Limits for tasks assessed                                     General Comments       Exercises Exercises: General Upper Extremity;Other exercises General Exercises - Upper Extremity Shoulder Flexion: PROM;Left;10 reps;Seated Shoulder Extension: PROM;Left;Seated;10 reps Elbow Flexion: PROM;Left;10 reps;Seated(Limited by pain) Elbow Extension: PROM;Left;10 reps;Seated(Limited by pain) Digit Composite Flexion: AROM;Left;10 reps;Seated Composite Extension: AROM;10 reps;Left;Seated Other Exercises Other Exercises: Finger opposition; seated; LUE; x10s Other Exercises: Education on edema management with movement and elevation   Shoulder Instructions      Home Living Family/patient expects to be discharged to:: Private residence Living Arrangements: Alone Available Help at Discharge: Family;Available PRN/intermittently Type of Home: Other(Comment)(Townhome) Home Access: Stairs to enter Entrance Stairs-Number of Steps: 2   Home Layout: Two level;Able to live on main level with bedroom/bathroom     Bathroom Shower/Tub: Occupational psychologist: Handicapped height     Home Equipment: [place];Shower seat;Cane - single point   Additional Comments: Pt's daughter lives in [place]. Pt stating "I am blessed. I have lots of friends who will come check on me and bring me food. I will be fine."      Prior Functioning/Environment Level of Independence: Independent        Comments: ADLs, IADLs, driving        OT Problem List: Decreased strength;Decreased range of motion;Decreased activity tolerance;Impaired balance (sitting and/or standing);Decreased safety awareness;Decreased knowledge of use of DME or AE;Decreased knowledge of precautions;Pain;Increased  edema;Impaired UE functional use      OT Treatment/Interventions: Self-care/ADL training;Therapeutic exercise;Energy conservation;DME and/or AE instruction;Therapeutic activities;Patient/family education    OT Goals(Current goals can be found in the care plan section) Acute Rehab OT Goals Patient Stated Goal: Go home OT Goal Formulation: With patient Time For Goal Achievement: 11/26/17 Potential to Achieve Goals: Good ADL Goals Pt Will Perform Upper Body Dressing: with set-up;with supervision;with caregiver independent in assisting;sitting Pt Will Perform Lower Body Dressing: with set-up;with supervision;sit to/from stand Pt Will Transfer to Toilet: with supervision;regular height toilet;ambulating Pt Will Perform Toileting - Clothing Manipulation and hygiene: with supervision;sit to/from stand;sitting/lateral leans Pt/caregiver will Perform Home Exercise Program: Increased ROM;Increased strength;Left upper extremity;Independently;With written HEP provided  OT Frequency: Min 3X/week   Barriers to D/C:            Co-evaluation              AM-PAC PT "6 Clicks" Daily Activity     Outcome Measure Help from another person eating meals?: A Little Help from another person taking care of personal grooming?: A Little Help from another person toileting, which includes using toliet, bedpan, or urinal?: A Little Help from another person bathing (including washing, rinsing, drying)?: A Little Help from another person to put on and taking off regular upper body clothing?: A Little Help from another person to put on and taking off regular lower body clothing?: A Little 6 Click Score: 18  End of Session Equipment Utilized During Treatment: Other (comment)(Sling) Nurse Communication: Mobility status;Weight bearing status  Activity Tolerance: Patient tolerated treatment well;Patient limited by pain Patient left: in chair;with call bell/phone within reach  OT Visit Diagnosis:  Unsteadiness on feet (R26.81);Other abnormalities of gait and mobility (R26.89);Muscle weakness (generalized) (M62.81);Pain Pain - Right/Left: Left Pain - part of body: Arm                Time: 1499-6924 OT Time Calculation (min): 30 min Charges:  OT General Charges $OT Visit: 1 Visit OT Evaluation $OT Eval Moderate Complexity: 1 Mod OT Treatments $Self Care/Home Management : 8-22 mins G-Codes:     [place], OTR/L Acute Rehab Pager: [phone] Office: [place] 11/12/2017, 9:23 AM

## 2017-11-13 MED ORDER — OXYCODONE-ACETAMINOPHEN 5-325 MG PO TABS: 1.0000 | ORAL_TABLET | ORAL | 0 refills | Status: DC | PRN

## 2017-11-13 NOTE — Progress Notes (Signed)
Occupational Therapy Treatment Patient Details Name: Lauren Hill MRN: [id number] DOB: 11-05-1934 Today's Date: 11/13/2017    History of present illness 83 year old female status post open reduction internal fixation of left humeral shaft fracture. PHM including right and left THA and left TSA (2017).   OT comments  Pt. Able to return demo of LUE HEP with focus on rom and edema management. Reviewed sling positioning and don/doff technique.  Dtr. Present and states pt.s sister will be with her at home.   Follow Up Recommendations  Home health OT;Supervision/Assistance - 24 hour    Equipment Recommendations  None recommended by OT    Recommendations for Other Services      Precautions / Restrictions Precautions Precaution Comments: Gentle elbow and shoulder ROM Restrictions Weight Bearing Restrictions: Yes LUE Weight Bearing: Non weight bearing Other Position/Activity Restrictions: Gentle elbow and shoulder ROM       Mobility Bed Mobility Overal bed mobility: Needs Assistance Bed Mobility: Supine to Sit     Supine to sit: Min assist     General bed mobility comments: provided trunk support while pt. scooted hips toward EOB, educated dtr. who was present not to pull on pt. or allow her to pull on anyone  Transfers Overall transfer level: Needs assistance Equipment used: None Transfers: Sit to/from [facility] Sit to Stand: Supervision Stand pivot transfers: Supervision       General transfer comment: MIn GUard for safety initially. Transitioning to supervision    Balance                                           ADL either performed or assessed with clinical judgement   ADL                                               Vision       Perception     Praxis      Cognition Arousal/Alertness: Awake/alert Behavior During Therapy: WFL for tasks assessed/performed Overall Cognitive Status: Within  Functional Limits for tasks assessed                                          Exercises General Exercises - Upper Extremity Elbow Flexion: PROM;Left;10 reps;Seated Elbow Extension: PROM;Left;10 reps;Seated Digit Composite Flexion: AROM;Left;10 reps;Seated Composite Extension: AROM;10 reps;Left;Seated Other Exercises Other Exercises: Finger opposition; seated; LUE; x10s Other Exercises: Education on edema management with movement and elevation-reviewed use of lotion to aide in massage and rom to L digits  (pts. dtr. asked if she could video the rom to provide info. to pts. sister who will be staying with her.  Allowed the video but reiterated that the therapy would/should be guided only with [facility] therapist upon d/c and they would be est. Goals in conjunction with surgeon's specifications. He would determine when pt. Allowed to increase rom and add other exercises.  At this time pt. Only cleared for gently rom to elbow and shoulder with some rom to digits for edema management. Pt. And dtr. Verbalized understanding. )   Shoulder Instructions Shoulder Instructions Donning/doffing sling/immobilizer: Minimal assistance Correct positioning of sling/immobilizer: Minimal assistance ROM for elbow, wrist  and digits of operated UE: Min-guard Sling wearing schedule (on at all times/off for ADL's): Min-guard     General Comments      Pertinent Vitals/ Pain       Pain Assessment: No/denies pain  Home Living                                          Prior Functioning/Environment              Frequency  Min 3X/week        Progress Toward Goals  OT Goals(current goals can now be found in the care plan section)  Progress towards OT goals: Progressing toward goals     Plan      Co-evaluation                 AM-PAC PT "6 Clicks" Daily Activity     Outcome Measure   Help from another person eating meals?: A Little Help from another person  taking care of personal grooming?: A Little Help from another person toileting, which includes using toliet, bedpan, or urinal?: A Little Help from another person bathing (including washing, rinsing, drying)?: A Little Help from another person to put on and taking off regular upper body clothing?: A Little Help from another person to put on and taking off regular lower body clothing?: A Little 6 Click Score: 18    End of Session Equipment Utilized During Treatment: Other (comment)(sling)  OT Visit Diagnosis: Unsteadiness on feet (R26.81);Other abnormalities of gait and mobility (R26.89);Muscle weakness (generalized) (M62.81);Pain Pain - Right/Left: Left Pain - part of body: Arm   Activity Tolerance Patient tolerated treatment well;Patient limited by pain   Patient Left in chair;with call bell/phone within reach;with family/visitor present   Nurse Communication Mobility status;Weight bearing status        Time: 0277-4128 OT Time Calculation (min): 17 min  Charges: OT General Charges $OT Visit: 1 Visit OT Treatments $Self Care/Home Management : 8-22 mins   Janice Coffin 11/13/2017, 9:57 AM

## 2017-11-13 NOTE — Discharge Summary (Signed)
[facility] (OTS)  Patient ID: Lauren Hill MRN: [id number] DOB/AGE: 81-Aug-1936 81 y.o.  Admit date: 11/11/2017 Discharge date: 11/13/2017  Admission Diagnoses:Closed comminuted fracture of left humerus  Discharge Diagnoses:  Active Problems:   Closed comminuted fracture of left humerus   Past Medical History:  Diagnosis Date  . Arthritis   . GERD (gastroesophageal reflux disease)    indigestion  . History of blood transfusion    as an infant  . History of hiatal hernia   . History of pneumonia   . Hyperlipidemia   . Hypertension   . Renal cyst      Procedures Performed: 11/11/2017: CPT 02542-[id number] reduction internal fixation of left distal humerus fracture  Discharged Condition: good  Hospital Course: Patient underwent the above surgery.  She did well from the surgery.  She was admitted for observation to work with occupational therapy.  Her pain was controlled with oral medications.  She was given postoperative antibiotics.  She is found fit for discharge home with home health OT.  Her dressing was changed on postoperative day 2.  Upon discharge she was tolerating regular diet, she was voiding spontaneously, and her pain was well controlled with oral medications.  Consults: None  Significant Diagnostic Studies: None  Treatments: surgery: as above  Discharge Exam: Vitals:   11/12/17 2012 11/13/17 0445  BP: 134/61 (!) 134/57  Pulse: 99 95  Resp: 16 16  Temp: 98.9 F (37.2 C) 98.7 F (37.1 C)  SpO2: 93% 93%   General: No acute distress awake alert oriented.  Left upper extremity: Dressing taken down, incision is with minimal serosang drainage.  Gentle range of motion of the elbow is with some discomfort.  She is motor and sensory function intact to median, radial and ulnar nerve distribution warm well-perfused hand with brisk cap refill less than 2 seconds.  Compartments are soft and compressible.    Disposition: Discharge disposition:  01-Home or Self Care        Allergies as of 11/13/2017   No Known Allergies     Medication List    STOP taking these medications   ondansetron 4 MG tablet Commonly known as:  ZOFRAN     TAKE these medications   acetaminophen 650 MG CR tablet Commonly known as:  TYLENOL Take 1,300 mg by mouth 2 (two) times daily.   amLODipine 5 MG tablet Commonly known as:  NORVASC Take 5 mg by mouth 2 (two) times daily.   aspirin 81 MG tablet Take 81 mg by mouth daily.   atenolol 50 MG tablet Commonly known as:  TENORMIN Take 50 mg by mouth daily.   atorvastatin 10 MG tablet Commonly known as:  LIPITOR Take 10 mg by mouth daily at 6 PM.   benazepril 10 MG tablet Commonly known as:  LOTENSIN Take 10 mg by mouth 2 (two) times daily.   famotidine 10 MG tablet Commonly known as:  PEPCID Take 10 mg by mouth at bedtime as needed for heartburn or indigestion.   magnesium gluconate 500 MG tablet Commonly known as:  MAGONATE Take 500 mg by mouth at bedtime.   methocarbamol 750 MG tablet Commonly known as:  ROBAXIN-750 Take 1 tablet (750 mg total) by mouth 4 (four) times daily.   multivitamin-lutein Caps capsule Take 1 capsule by mouth daily.   oxyCODONE-acetaminophen 5-325 MG tablet Commonly known as:  PERCOCET Take 1 tablet by mouth every 4 (four) hours as needed for severe pain. What changed:  when to take  this   Potassium 99 MG Tabs Take 99 mg by mouth at bedtime.      Follow-up Information    Haddix, Thomasene Lot, MD. Schedule an appointment as soon as possible for a visit in 2 week(s).   Specialty:  Orthopedic Surgery Contact information: [place] [address] [phone]           Discharge Instructions and Plan: Patient will be nonweightbearing to the left upper extremity she will work on range of motion she will be discharged with home health occupational therapy.  I will plan to see her back in approximately 2 weeks for suture  removal and x-rays.  No DVT prophylaxis is needed.  Signed:  Shona Needles, MD [facility] [phone] (phone) 11/13/2017, 1:38 PM

## 2017-11-14 ENCOUNTER — Encounter (HOSPITAL_COMMUNITY): Payer: Self-pay | Admitting: Student

## 2017-11-14 LAB — CALCITRIOL (1,25 DI-OH VIT D): VIT D 1 25 DIHYDROXY: 49.2 pg/mL (ref 19.9–79.3)

## 2017-11-29 DIAGNOSIS — S42352D Displaced comminuted fracture of shaft of humerus, left arm, subsequent encounter for fracture with routine healing: Secondary | ICD-10-CM | POA: Diagnosis not present

## 2017-12-02 DIAGNOSIS — M25622 Stiffness of left elbow, not elsewhere classified: Secondary | ICD-10-CM | POA: Diagnosis not present

## 2017-12-21 DIAGNOSIS — E7849 Other hyperlipidemia: Secondary | ICD-10-CM | POA: Diagnosis not present

## 2017-12-21 DIAGNOSIS — F418 Other specified anxiety disorders: Secondary | ICD-10-CM | POA: Diagnosis not present

## 2017-12-21 DIAGNOSIS — M4800 Spinal stenosis, site unspecified: Secondary | ICD-10-CM | POA: Diagnosis not present

## 2017-12-21 DIAGNOSIS — Z6830 Body mass index (BMI) 30.0-30.9, adult: Secondary | ICD-10-CM | POA: Diagnosis not present

## 2017-12-21 DIAGNOSIS — M1990 Unspecified osteoarthritis, unspecified site: Secondary | ICD-10-CM | POA: Diagnosis not present

## 2017-12-21 DIAGNOSIS — E668 Other obesity: Secondary | ICD-10-CM | POA: Diagnosis not present

## 2017-12-21 DIAGNOSIS — K648 Other hemorrhoids: Secondary | ICD-10-CM | POA: Diagnosis not present

## 2017-12-21 DIAGNOSIS — I10 Essential (primary) hypertension: Secondary | ICD-10-CM | POA: Diagnosis not present

## 2017-12-26 DIAGNOSIS — R300 Dysuria: Secondary | ICD-10-CM | POA: Diagnosis not present

## 2017-12-26 DIAGNOSIS — R3129 Other microscopic hematuria: Secondary | ICD-10-CM | POA: Diagnosis not present

## 2017-12-27 DIAGNOSIS — S42352D Displaced comminuted fracture of shaft of humerus, left arm, subsequent encounter for fracture with routine healing: Secondary | ICD-10-CM | POA: Diagnosis not present

## 2018-01-25 DIAGNOSIS — Z13820 Encounter for screening for osteoporosis: Secondary | ICD-10-CM | POA: Diagnosis not present

## 2018-02-07 DIAGNOSIS — S42352D Displaced comminuted fracture of shaft of humerus, left arm, subsequent encounter for fracture with routine healing: Secondary | ICD-10-CM | POA: Diagnosis not present

## 2018-02-18 DIAGNOSIS — Z23 Encounter for immunization: Secondary | ICD-10-CM | POA: Diagnosis not present

## 2018-05-16 DIAGNOSIS — S42352D Displaced comminuted fracture of shaft of humerus, left arm, subsequent encounter for fracture with routine healing: Secondary | ICD-10-CM | POA: Diagnosis not present

## 2018-06-13 ENCOUNTER — Encounter

## 2018-06-13 ENCOUNTER — Encounter: Payer: Self-pay | Admitting: Obstetrics & Gynecology

## 2018-06-13 ENCOUNTER — Other Ambulatory Visit (HOSPITAL_COMMUNITY)
Admission: RE | Admit: 2018-06-13 | Discharge: 2018-06-13 | Disposition: A | Discharge status: Home / Self Care | Payer: PPO | Source: Ambulatory Visit | Attending: Obstetrics & Gynecology | Admitting: Obstetrics & Gynecology

## 2018-06-13 ENCOUNTER — Ambulatory Visit (INDEPENDENT_AMBULATORY_CARE_PROVIDER_SITE_OTHER): Payer: PPO | Admitting: Obstetrics & Gynecology

## 2018-06-13 VITALS — BP 128/80 | HR 92 | Resp 16 | Ht 61.25 in | Wt 179.4 lb

## 2018-06-13 DIAGNOSIS — Z124 Encounter for screening for malignant neoplasm of cervix: Secondary | ICD-10-CM | POA: Insufficient documentation

## 2018-06-13 DIAGNOSIS — Z01419 Encounter for gynecological examination (general) (routine) without abnormal findings: Secondary | ICD-10-CM

## 2018-06-13 MED ORDER — HYDROCORTISONE ACETATE 25 MG RE SUPP: 25.0000 mg | Freq: Two times a day (BID) | RECTAL | 1 refills | Status: DC

## 2018-06-13 MED ORDER — HYDROCORTISONE 2.5 % RE CREA: 1.0000 "application " | TOPICAL_CREAM | Freq: Two times a day (BID) | RECTAL | 1 refills | Status: DC

## 2018-06-13 NOTE — Progress Notes (Signed)
84 y.o. G1P1 Widowed White or Caucasian female here for annual exam.  Doing well.  She did break her humerus in June.  She tripped on a rug going out the back door on her patio.  She did well with surgery.  She did not need to have any physical therapy.   Denies vaginal bleeding.    Having some issues with hemorrhoids.  Would like something more than OTC for treatment.  PCP:  Dr. Reynaldo Minium.  Has appt later this month.  Will do blood work later this month.    Patient's last menstrual period was 06/08/1983.          Sexually active: No.  The current method of family planning is post menopausal status.    Exercising: Yes.    bicycle Smoker:  no  Health Maintenance: Pap:  03/08/16 neg   03/02/13 neg  History of abnormal Pap:  no MMG:  08/11/17 BIRADS2:Benign  Colonoscopy:  04/14/10 f/u 5 years.  Was advised not to do this again unless with issues.   BMD:   2015 Normal  TDaP:  2019 Pneumonia vaccine(s):  done Shingrix:   No Hep C testing: n/a Screening Labs: PCP   reports that she has never smoked. She has never used smokeless tobacco. She reports that she does not drink alcohol or use drugs.  Past Medical History:  Diagnosis Date  . Arthritis   . GERD (gastroesophageal reflux disease)    indigestion  . History of blood transfusion    as an infant  . History of hiatal hernia   . History of pneumonia   . Hyperlipidemia   . Hypertension   . Renal cyst     Past Surgical History:  Procedure Laterality Date  . COLONOSCOPY    . DILATION AND CURETTAGE OF UTERUS     x2  . EYE SURGERY     cataracts  . HIP SURGERY  5/05   right hip replacement  . HIP SURGERY  5/11   left hip replacement  . ORIF HUMERUS FRACTURE Left 11/11/2017   Procedure: OPEN REDUCTION INTERNAL FIXATION (ORIF) LEFT DISTAL HUMERUS FRACTURE;  Surgeon: Shona Needles, MD;  Location: [facility];  Service: Orthopedics;  Laterality: Left;  . RHINOPLASTY    . TONSILLECTOMY    . TOTAL SHOULDER ARTHROPLASTY Left 04/01/2016   Procedure: LEFT TOTAL SHOULDER ARTHROPLASTY;  Surgeon: Justice Britain, MD;  Location: [facility];  Service: Orthopedics;  Laterality: Left;    Current Outpatient Medications  Medication Sig Dispense Refill  . acetaminophen (TYLENOL) 650 MG CR tablet Take 1,300 mg by mouth 2 (two) times daily.    Marland Kitchen amLODipine (NORVASC) 5 MG tablet Take 5 mg by mouth 2 (two) times daily.     Marland Kitchen aspirin 81 MG tablet Take 81 mg by mouth daily.    Marland Kitchen atenolol (TENORMIN) 50 MG tablet Take 50 mg by mouth daily.    Marland Kitchen atorvastatin (LIPITOR) 10 MG tablet Take 10 mg by mouth daily at 6 PM.     . benazepril (LOTENSIN) 10 MG tablet Take 10 mg by mouth 2 (two) times daily.     . famotidine (PEPCID) 10 MG tablet Take 10 mg by mouth at bedtime as needed for heartburn or indigestion.    . magnesium gluconate (MAGONATE) 500 MG tablet Take 500 mg by mouth at bedtime.    . Potassium 99 MG TABS Take 99 mg by mouth at bedtime.     No current facility-administered medications for this visit.  Family History  Problem Relation Age of Onset  . Heart disease Mother     Review of Systems  Musculoskeletal: Positive for myalgias.  All other systems reviewed and are negative.   Exam:   BP 128/80 (BP Location: Left Arm, Patient Position: Sitting, Cuff Size: Large)   Pulse 92   Resp 16   Ht 5' 1.25" (1.556 m)   Wt 179 lb 6.4 oz (81.4 kg)   LMP 06/08/1983   BMI 33.62 kg/m    Height: 5' 1.25" (155.6 cm)  Ht Readings from Last 3 Encounters:  06/13/18 5' 1.25" (1.556 m)  11/11/17 5' 1.5" (1.562 m)  11/07/17 5' 1.5" (1.562 m)    General appearance: alert, cooperative and appears stated age Head: Normocephalic, without obvious abnormality, atraumatic Neck: no adenopathy, supple, symmetrical, trachea midline and thyroid normal to inspection and palpation Lungs: clear to auscultation bilaterally Breasts: normal appearance, no masses or tenderness Heart: regular rate and rhythm Abdomen: soft, non-tender; bowel sounds normal; no  masses,  no organomegaly Extremities: extremities normal, atraumatic, no cyanosis or edema Skin: Skin color, texture, turgor normal. No rashes or lesions Lymph nodes: Cervical, supraclavicular, and axillary nodes normal. No abnormal inguinal nodes palpated Neurologic: Grossly normal   Pelvic: External genitalia:  no lesions              Urethra:  normal appearing urethra with no masses, tenderness or lesions              Bartholins and Skenes: normal                 Vagina: normal appearing vagina with normal color and discharge, no lesions              Cervix: no lesions              Pap taken: No. Bimanual Exam:  Uterus:  normal size, contour, position, consistency, mobility, non-tender              Adnexa: normal adnexa and no mass, fullness, tenderness               Rectovaginal: Confirms               Anus:  normal sphincter tone, no lesions, no thrombosed internal and external hemorrhoids  Chaperone was present for exam.  A:  Well Woman with normal exam PMP, no HRT Hypertension H/O elevated cholesterol Hemorrhoids  P:   Mammogram guidelines reviewed.  Doing 3D. pap smear obtained Lab work and vaccines done with Dr. Reynaldo Minium Hydrocortisone suppositories 25mg  pv bid prn.  #12/0RF. Anusol HC 2.5% cream topically up to four times daily as needed.  #30g/0RF return annually or prn

## 2018-06-15 LAB — CYTOLOGY - PAP: Diagnosis: NEGATIVE

## 2018-06-21 DIAGNOSIS — M5136 Other intervertebral disc degeneration, lumbar region: Secondary | ICD-10-CM | POA: Diagnosis not present

## 2018-06-26 DIAGNOSIS — E7849 Other hyperlipidemia: Secondary | ICD-10-CM | POA: Diagnosis not present

## 2018-06-26 DIAGNOSIS — I10 Essential (primary) hypertension: Secondary | ICD-10-CM | POA: Diagnosis not present

## 2018-06-26 DIAGNOSIS — R82998 Other abnormal findings in urine: Secondary | ICD-10-CM | POA: Diagnosis not present

## 2018-07-03 DIAGNOSIS — E7849 Other hyperlipidemia: Secondary | ICD-10-CM | POA: Diagnosis not present

## 2018-07-03 DIAGNOSIS — E669 Obesity, unspecified: Secondary | ICD-10-CM | POA: Diagnosis not present

## 2018-07-03 DIAGNOSIS — F419 Anxiety disorder, unspecified: Secondary | ICD-10-CM | POA: Diagnosis not present

## 2018-07-03 DIAGNOSIS — Z1331 Encounter for screening for depression: Secondary | ICD-10-CM | POA: Diagnosis not present

## 2018-07-03 DIAGNOSIS — I10 Essential (primary) hypertension: Secondary | ICD-10-CM | POA: Diagnosis not present

## 2018-07-03 DIAGNOSIS — Z0000 Encounter for general adult medical examination without abnormal findings: Secondary | ICD-10-CM | POA: Diagnosis not present

## 2018-07-03 DIAGNOSIS — M4800 Spinal stenosis, site unspecified: Secondary | ICD-10-CM | POA: Diagnosis not present

## 2018-07-03 DIAGNOSIS — Z6832 Body mass index (BMI) 32.0-32.9, adult: Secondary | ICD-10-CM | POA: Diagnosis not present

## 2018-07-03 DIAGNOSIS — M1990 Unspecified osteoarthritis, unspecified site: Secondary | ICD-10-CM | POA: Diagnosis not present

## 2018-07-17 DIAGNOSIS — M545 Low back pain: Secondary | ICD-10-CM | POA: Diagnosis not present

## 2018-07-21 DIAGNOSIS — M545 Low back pain: Secondary | ICD-10-CM | POA: Diagnosis not present

## 2018-07-24 DIAGNOSIS — M545 Low back pain: Secondary | ICD-10-CM | POA: Diagnosis not present

## 2018-07-31 DIAGNOSIS — M545 Low back pain: Secondary | ICD-10-CM | POA: Diagnosis not present

## 2018-08-03 DIAGNOSIS — M545 Low back pain: Secondary | ICD-10-CM | POA: Diagnosis not present

## 2018-08-07 DIAGNOSIS — M545 Low back pain: Secondary | ICD-10-CM | POA: Diagnosis not present

## 2018-08-08 ENCOUNTER — Encounter (HOSPITAL_COMMUNITY): Payer: Self-pay | Admitting: Emergency Medicine

## 2018-08-08 ENCOUNTER — Emergency Department (HOSPITAL_COMMUNITY): Payer: PPO

## 2018-08-08 ENCOUNTER — Other Ambulatory Visit: Payer: Self-pay

## 2018-08-08 ENCOUNTER — Emergency Department (HOSPITAL_COMMUNITY)
Admission: EM | Admit: 2018-08-08 | Discharge: 2018-08-08 | Disposition: A | Discharge status: Home / Self Care | Payer: PPO | Attending: Emergency Medicine | Admitting: Emergency Medicine

## 2018-08-08 DIAGNOSIS — Y929 Unspecified place or not applicable: Secondary | ICD-10-CM | POA: Insufficient documentation

## 2018-08-08 DIAGNOSIS — T148XXA Other injury of unspecified body region, initial encounter: Secondary | ICD-10-CM

## 2018-08-08 DIAGNOSIS — S60511A Abrasion of right hand, initial encounter: Secondary | ICD-10-CM | POA: Diagnosis not present

## 2018-08-08 DIAGNOSIS — Y998 Other external cause status: Secondary | ICD-10-CM | POA: Insufficient documentation

## 2018-08-08 DIAGNOSIS — Z79899 Other long term (current) drug therapy: Secondary | ICD-10-CM | POA: Insufficient documentation

## 2018-08-08 DIAGNOSIS — I10 Essential (primary) hypertension: Secondary | ICD-10-CM | POA: Insufficient documentation

## 2018-08-08 DIAGNOSIS — Z96612 Presence of left artificial shoulder joint: Secondary | ICD-10-CM | POA: Insufficient documentation

## 2018-08-08 DIAGNOSIS — S61411A Laceration without foreign body of right hand, initial encounter: Secondary | ICD-10-CM | POA: Insufficient documentation

## 2018-08-08 DIAGNOSIS — S0990XA Unspecified injury of head, initial encounter: Secondary | ICD-10-CM

## 2018-08-08 DIAGNOSIS — Y9389 Activity, other specified: Secondary | ICD-10-CM | POA: Insufficient documentation

## 2018-08-08 DIAGNOSIS — W19XXXA Unspecified fall, initial encounter: Secondary | ICD-10-CM

## 2018-08-08 DIAGNOSIS — W0110XA Fall on same level from slipping, tripping and stumbling with subsequent striking against unspecified object, initial encounter: Secondary | ICD-10-CM | POA: Diagnosis not present

## 2018-08-08 DIAGNOSIS — S0003XA Contusion of scalp, initial encounter: Secondary | ICD-10-CM | POA: Diagnosis not present

## 2018-08-08 DIAGNOSIS — Z7982 Long term (current) use of aspirin: Secondary | ICD-10-CM | POA: Diagnosis not present

## 2018-08-08 NOTE — Discharge Instructions (Addendum)
It was my pleasure taking care of you today!   Follow up with your primary care doctor. Call tomorrow to schedule a follow up appointment.   Keep your wounds clean and dry.   Return to ER for new or worsening symptoms, any additional concerns.

## 2018-08-08 NOTE — ED Triage Notes (Signed)
Per pt, states she tripped going down about 4-5 stairs-abrasions to right hand-small laceration to right side of head and back of head-no LOC, no blood thinners

## 2018-08-08 NOTE — ED Provider Notes (Signed)
[facility] DEPT Provider Note   CSN: [id number] Arrival date & time: 08/08/18  1639    History   Chief Complaint Chief Complaint  Patient presents with  . Fall    HPI Lauren Hill is a 84 y.o. female.     The history is provided by the patient and medical records. No language interpreter was used.  Fall     Lauren Hill is a 84 y.o. female  with a PMH as listed below who presents to the Emergency Department for evaluation after mechanical fall just prior to arrival.  Patient states that she accidentally tripped on the wet stairs outside and fell forward striking her head.  No loss of consciousness.  No nausea or vomiting.  She reports wounds to the side of her head, but denies a headache.  She is not on any anticoagulation.  Has laceration to the right dorsum of her hand that she put a Band-Aid on.  Her tetanus is up-to-date.  No neck pain, abdominal pain, back pain, chest pain or shortness of breath.  No hip pain or extremity pain.  Occasions taken prior to arrival for symptoms.  No numbness, tingling or weakness.   Past Medical History:  Diagnosis Date  . Arthritis   . GERD (gastroesophageal reflux disease)    indigestion  . History of blood transfusion    as an infant  . History of hiatal hernia   . History of pneumonia   . Hyperlipidemia   . Hypertension   . Renal cyst     Patient Active Problem List   Diagnosis Date Noted  . Closed comminuted fracture of left humerus 11/11/2017  . 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 Essential hypertension 03/22/2014  . Elevated lipids 03/22/2014  . Microscopic hematuria 03/22/2014    Past Surgical History:  Procedure Laterality Date  . COLONOSCOPY    . DILATION AND CURETTAGE OF UTERUS     x2  . EYE SURGERY     cataracts  . HIP SURGERY  5/05   right hip replacement  . HIP SURGERY  5/11   left hip replacement  . ORIF HUMERUS FRACTURE Left 11/11/2017   Procedure: OPEN  REDUCTION INTERNAL FIXATION (ORIF) LEFT DISTAL HUMERUS FRACTURE;  Surgeon: Shona Needles, MD;  Location: [facility];  Service: Orthopedics;  Laterality: Left;  . RHINOPLASTY    . TONSILLECTOMY    . TOTAL SHOULDER ARTHROPLASTY Left 04/01/2016   Procedure: LEFT TOTAL SHOULDER ARTHROPLASTY;  Surgeon: Justice Britain, MD;  Location: [facility];  Service: Orthopedics;  Laterality: Left;     OB History    Gravida  1   Para  1   Term      Preterm      AB      Living  1     SAB      TAB      Ectopic      Multiple      Live Births               Home Medications    Prior to Admission medications   Medication Sig Start Date End Date Taking? Authorizing Provider  acetaminophen (TYLENOL) 650 MG CR tablet Take 1,300 mg by mouth 2 (two) times daily.   Yes [provider]  amLODipine (NORVASC) 5 MG tablet Take 5 mg by mouth 2 (two) times daily.  01/12/16  Yes [provider]  aspirin 81 MG tablet Take 81 mg by  mouth at bedtime.    Yes [provider]  Aspirin-Caffeine (ANACIN) 400-32 MG TABS Take 2 tablets by mouth daily as needed (pain).   Yes [provider]  atenolol (TENORMIN) 50 MG tablet Take 50 mg by mouth daily.   Yes [provider]  atorvastatin (LIPITOR) 10 MG tablet Take 10 mg by mouth daily at 6 PM.    Yes [provider]  benazepril (LOTENSIN) 10 MG tablet Take 10 mg by mouth 2 (two) times daily.  01/12/16  Yes [provider]  diclofenac sodium (VOLTAREN) 1 % GEL Apply 1 application topically at bedtime. 07/03/18  Yes [provider]  famotidine (PEPCID) 10 MG tablet Take 10 mg by mouth at bedtime as needed for heartburn or indigestion.   Yes [provider]  hydrocortisone (ANUSOL-HC) 2.5 % rectal cream Place 1 application rectally 2 (two) times daily. Patient taking differently: Place 1 application rectally 2 (two) times daily as needed for hemorrhoids.  06/13/18  Yes Megan Salon, MD  hydrocortisone  (ANUSOL-HC) 25 MG suppository Place 1 suppository (25 mg total) rectally 2 (two) times daily. Patient taking differently: Place 25 mg rectally 2 (two) times daily as needed for hemorrhoids.  06/13/18  Yes Megan Salon, MD  magnesium gluconate (MAGONATE) 500 MG tablet Take 500 mg by mouth at bedtime.   Yes [provider]  Potassium 99 MG TABS Take 99 mg by mouth at bedtime.   Yes [provider]    Family History Family History  Problem Relation Age of Onset  . Heart disease Mother     Social History Social History   Tobacco Use  . Smoking status: Never Smoker  . Smokeless tobacco: Never Used  Substance Use Topics  . Alcohol use: No  . Drug use: No     Allergies   Patient has no known allergies.   Review of Systems Review of Systems  Skin: Positive for wound.  All other systems reviewed and are negative.    Physical Exam Updated Vital Signs BP 125/65   Pulse (!) 105   Temp 97.8 F (36.6 C) (Oral)   Resp 20   Ht 5\' 1"  (1.549 m)   Wt 82.6 kg   LMP 06/08/1983   SpO2 95%   BMI 34.39 kg/m   Physical Exam Vitals signs and nursing note reviewed.  Constitutional:      General: She is not in acute distress.    Appearance: She is well-developed.  HENT:     Head: Normocephalic.     Comments: Erythema to the right side of her scalp with no laceration or true open wounds. Neck:     Musculoskeletal: Neck supple.  Cardiovascular:     Rate and Rhythm: Regular rhythm.     Heart sounds: Normal heart sounds. No murmur.  Pulmonary:     Effort: Pulmonary effort is normal. No respiratory distress.     Breath sounds: Normal breath sounds.  Abdominal:     General: There is no distension.     Palpations: Abdomen is soft.     Tenderness: There is no abdominal tenderness.  Musculoskeletal:     Comments: No C/T/L-spine tenderness.  No tenderness at the hips or of the extremities.  Skin:    General: Skin is warm and dry.     Comments: 2 cm "c" shaped skin  tear to the dorsum of the right hand.  Neurological:     Mental Status: She is alert and oriented to  person, place, and time.     Comments: Alert, oriented, thought content appropriate, able to give a coherent history. Speech is clear and goal oriented, able to follow commands.  Cranial Nerves:  II:  Peripheral visual fields grossly normal, pupils equal, round, reactive to light III, IV, VI: EOM intact bilaterally, ptosis not present V,VII: smile symmetric, eyes kept closed tightly against resistance, facial light touch sensation equal VIII: hearing grossly normal IX, X: symmetric soft palate movement, uvula elevates symmetrically  XI: bilateral shoulder shrug symmetric and strong XII: midline tongue extension 5/5 muscle strength in upper and lower extremities bilaterally including strong and equal grip strength and dorsiflexion/plantar flexion Sensory to light touch normal in all four extremities.      ED Treatments / Results  Labs (all labs ordered are listed, but only abnormal results are displayed) Labs Reviewed - No data to display  EKG None  Radiology Ct Head Wo Contrast  Result Date: 08/08/2018 CLINICAL DATA:  Fall, struck front of head. History of hypertension. EXAM: CT HEAD WITHOUT CONTRAST TECHNIQUE: Contiguous axial images were obtained from the base of the skull through the vertex without intravenous contrast. COMPARISON:  CT HEAD November 07, 2017 FINDINGS: BRAIN: No intraparenchymal hemorrhage, mass effect nor midline shift. No parenchymal brain volume loss for age. No hydrocephalus. Patchy supratentorial white matter hypodensities less than expected for patient's age, though non-specific are most compatible with chronic small vessel ischemic disease. No acute large vascular territory infarcts. No abnormal extra-axial fluid collections. Basal cisterns are patent. VASCULAR: Mild-to-moderate calcific atherosclerosis of the carotid siphons. SKULL: No skull fracture. Severe  temporomandibular osteoarthrosis. Minimal LEFT frontal scalp hematoma without subcutaneous gas or radiopaque foreign bodies. SINUSES/ORBITS: Trace paranasal sinus mucosal thickening. Mastoid air cells are well aerated.The included ocular globes and orbital contents are non-suspicious. Status post bilateral ocular lens implants. OTHER: None. IMPRESSION: 1. No acute intracranial process. Minimal LEFT frontal scalp hematoma. 2. Otherwise negative non-contrast CT HEAD for age. Electronically Signed   By: Elon Alas M.D.   On: 08/08/2018 20:07    Procedures Procedures (including critical care time)  Medications Ordered in ED Medications - No data to display   Initial Impression / Assessment and Plan / ED Course  I have reviewed the triage vital signs and the nursing notes.  Pertinent labs & imaging results that were available during my care of the patient were reviewed by me and considered in my medical decision making (see chart for details).        Lauren Hill is a 84 y.o. female who presents to ED for evaluation after head injury s/p mechanical fall just prior to arrival.  Denies any weakness, lightheadedness or dizziness.  No neurologic deficits on exam.  No midline tenderness to the C/T/L-spine to suggest serious neck or back injury.  CT head without any acute intracranial abnormalities.  Small skin tear of the right dorsum of her hand repaired with Steri-Strips after thorough irrigation in ED.  Her tetanus is up-to-date.  Symptomatic home care instructions, return precautions and PCP follow-up was discussed and all questions answered.   Final Clinical Impressions(s) / ED Diagnoses   Final diagnoses:  Fall, initial encounter  Minor head injury, initial encounter  Abrasion    ED Discharge Orders    None       Ward, Ozella Almond, PA-C 08/08/18 2054    Gareth Morgan, MD 08/11/18 1517

## 2018-08-08 NOTE — ED Notes (Signed)
Pt states she is ready to leave.

## 2018-08-17 DIAGNOSIS — Z1231 Encounter for screening mammogram for malignant neoplasm of breast: Secondary | ICD-10-CM | POA: Diagnosis not present

## 2018-11-02 DIAGNOSIS — D2261 Melanocytic nevi of right upper limb, including shoulder: Secondary | ICD-10-CM | POA: Diagnosis not present

## 2018-11-02 DIAGNOSIS — D2271 Melanocytic nevi of right lower limb, including hip: Secondary | ICD-10-CM | POA: Diagnosis not present

## 2018-11-02 DIAGNOSIS — D225 Melanocytic nevi of trunk: Secondary | ICD-10-CM | POA: Diagnosis not present

## 2018-11-02 DIAGNOSIS — D1801 Hemangioma of skin and subcutaneous tissue: Secondary | ICD-10-CM | POA: Diagnosis not present

## 2018-11-02 DIAGNOSIS — Z85828 Personal history of other malignant neoplasm of skin: Secondary | ICD-10-CM | POA: Diagnosis not present

## 2018-11-02 DIAGNOSIS — D2262 Melanocytic nevi of left upper limb, including shoulder: Secondary | ICD-10-CM | POA: Diagnosis not present

## 2018-11-02 DIAGNOSIS — L570 Actinic keratosis: Secondary | ICD-10-CM | POA: Diagnosis not present

## 2018-11-02 DIAGNOSIS — D2272 Melanocytic nevi of left lower limb, including hip: Secondary | ICD-10-CM | POA: Diagnosis not present

## 2018-11-02 DIAGNOSIS — L821 Other seborrheic keratosis: Secondary | ICD-10-CM | POA: Diagnosis not present

## 2019-02-01 DIAGNOSIS — Z23 Encounter for immunization: Secondary | ICD-10-CM | POA: Diagnosis not present

## 2019-02-21 DIAGNOSIS — M1990 Unspecified osteoarthritis, unspecified site: Secondary | ICD-10-CM | POA: Diagnosis not present

## 2019-02-21 DIAGNOSIS — F419 Anxiety disorder, unspecified: Secondary | ICD-10-CM | POA: Diagnosis not present

## 2019-02-21 DIAGNOSIS — M4800 Spinal stenosis, site unspecified: Secondary | ICD-10-CM | POA: Diagnosis not present

## 2019-02-21 DIAGNOSIS — E669 Obesity, unspecified: Secondary | ICD-10-CM | POA: Diagnosis not present

## 2019-02-21 DIAGNOSIS — E785 Hyperlipidemia, unspecified: Secondary | ICD-10-CM | POA: Diagnosis not present

## 2019-02-21 DIAGNOSIS — I10 Essential (primary) hypertension: Secondary | ICD-10-CM | POA: Diagnosis not present

## 2019-02-21 DIAGNOSIS — K648 Other hemorrhoids: Secondary | ICD-10-CM | POA: Diagnosis not present

## 2019-06-24 ENCOUNTER — Ambulatory Visit: Payer: Medicare Other | Attending: Internal Medicine

## 2019-06-24 DIAGNOSIS — Z23 Encounter for immunization: Secondary | ICD-10-CM | POA: Insufficient documentation

## 2019-07-04 DIAGNOSIS — I10 Essential (primary) hypertension: Secondary | ICD-10-CM | POA: Diagnosis not present

## 2019-07-04 DIAGNOSIS — E7849 Other hyperlipidemia: Secondary | ICD-10-CM | POA: Diagnosis not present

## 2019-07-04 DIAGNOSIS — R82998 Other abnormal findings in urine: Secondary | ICD-10-CM | POA: Diagnosis not present

## 2019-07-05 ENCOUNTER — Ambulatory Visit: Payer: PPO

## 2019-07-12 ENCOUNTER — Ambulatory Visit: Payer: PPO | Attending: Internal Medicine

## 2019-07-12 DIAGNOSIS — Z0000 Encounter for general adult medical examination without abnormal findings: Secondary | ICD-10-CM | POA: Diagnosis not present

## 2019-07-12 DIAGNOSIS — M4800 Spinal stenosis, site unspecified: Secondary | ICD-10-CM | POA: Diagnosis not present

## 2019-07-12 DIAGNOSIS — I10 Essential (primary) hypertension: Secondary | ICD-10-CM | POA: Diagnosis not present

## 2019-07-12 DIAGNOSIS — Z23 Encounter for immunization: Secondary | ICD-10-CM | POA: Insufficient documentation

## 2019-07-12 DIAGNOSIS — Z1339 Encounter for screening examination for other mental health and behavioral disorders: Secondary | ICD-10-CM | POA: Diagnosis not present

## 2019-07-12 DIAGNOSIS — E669 Obesity, unspecified: Secondary | ICD-10-CM | POA: Diagnosis not present

## 2019-07-12 DIAGNOSIS — Z1331 Encounter for screening for depression: Secondary | ICD-10-CM | POA: Diagnosis not present

## 2019-07-12 DIAGNOSIS — M1990 Unspecified osteoarthritis, unspecified site: Secondary | ICD-10-CM | POA: Diagnosis not present

## 2019-07-12 NOTE — Progress Notes (Signed)
   Covid-19 Vaccination Clinic  Name:  BARBE THORUP    MRN: [id number] DOB: 07-19-1934  07/12/2019  Ms. Vicini was observed post Covid-19 immunization for 15 minutes without incidence. She was provided with Vaccine Information Sheet and instruction to access the V-Safe system.   Ms. Osuch was instructed to call 911 with any severe reactions post vaccine: Marland Kitchen Difficulty breathing  . Swelling of your face and throat  . A fast heartbeat  . A bad rash all over your body  . Dizziness and weakness    Immunizations Administered    Name Date Dose VIS Date Route   Pfizer COVID-19 Vaccine 07/12/2019  8:44 AM 0.3 mL 05/18/2019 Intramuscular   Manufacturer: [place]   Lot: [id number]   [place]: [id number]

## 2019-09-03 DIAGNOSIS — Z1231 Encounter for screening mammogram for malignant neoplasm of breast: Secondary | ICD-10-CM | POA: Diagnosis not present

## 2019-09-04 IMAGING — CT CT HEAD W/O CM
3 series · 16 of 47 positions shown, 19 images · non-contrast
Comparison: CT HEAD November 07, 2017

CLINICAL DATA: Fall, struck front of head. History of hypertension.

EXAM:
CT HEAD WITHOUT CONTRAST
TECHNIQUE: Contiguous axial images were obtained from the base of the skull
through the vertex without intravenous contrast.

[Series 2: head wo · axial · 0.44mm/px · z∈[-154,-19]mm · 10 of 33 slices shown, 13 images]
[im 3/33  brain]
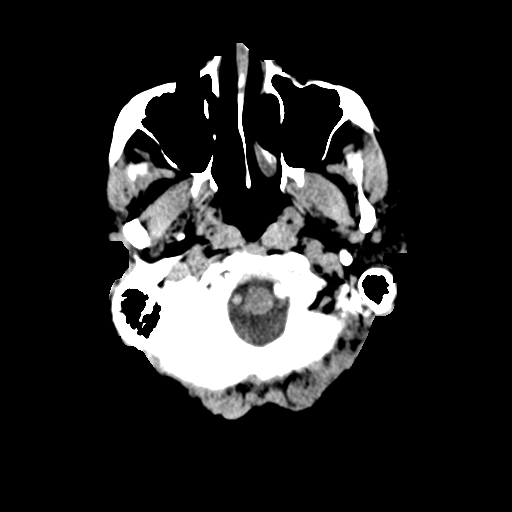
[im 3/33  bone]
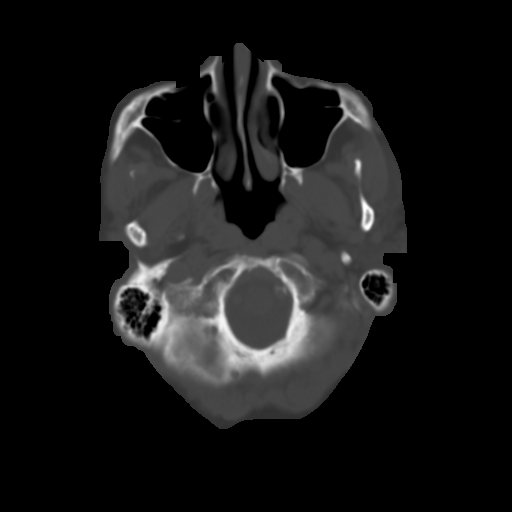
[im 6/33  brain]
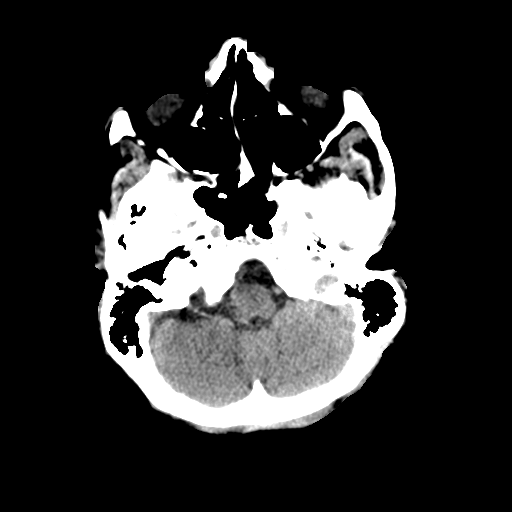
[im 9/33  brain]
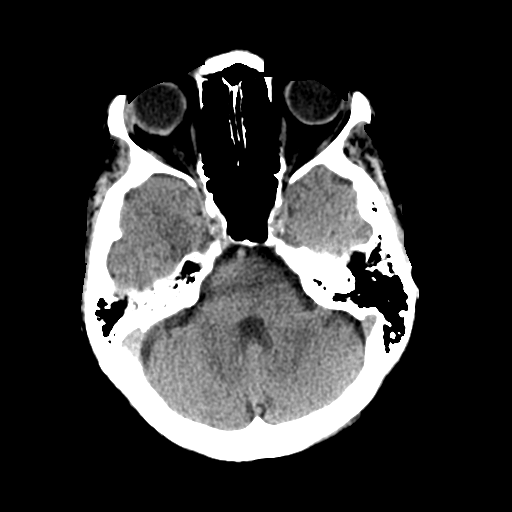
[im 12/33  brain]
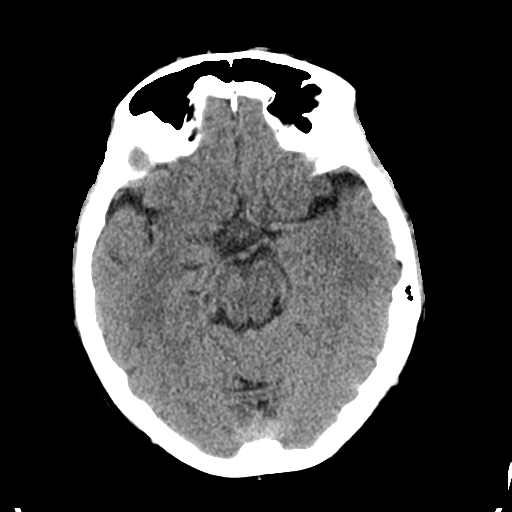
[im 15/33  brain]
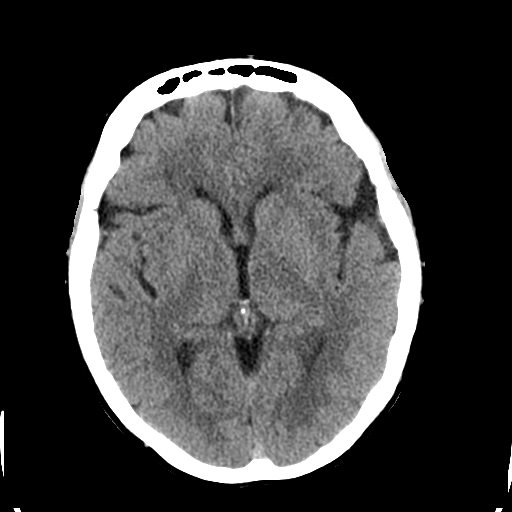
[im 15/33  bone]
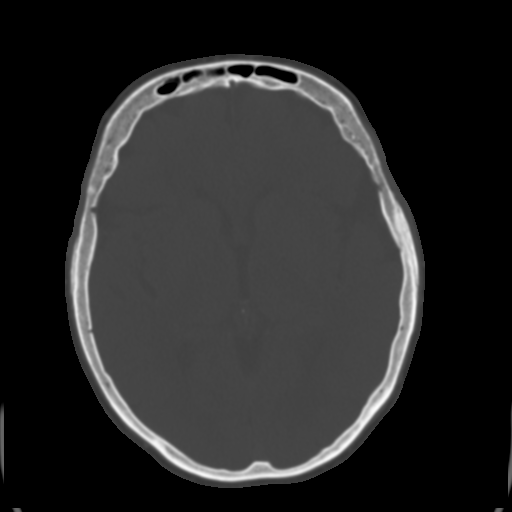
[im 18/33  brain]
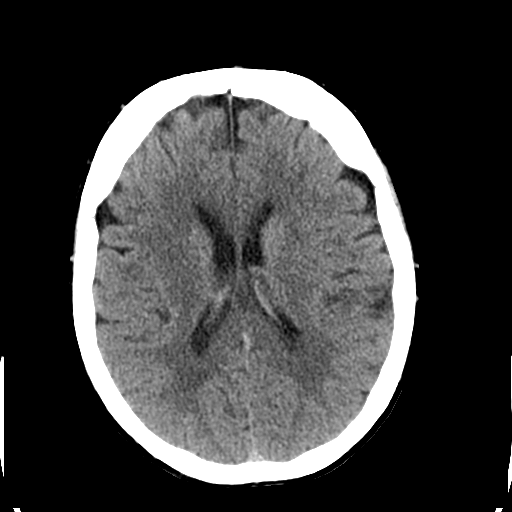
[im 21/33  brain]
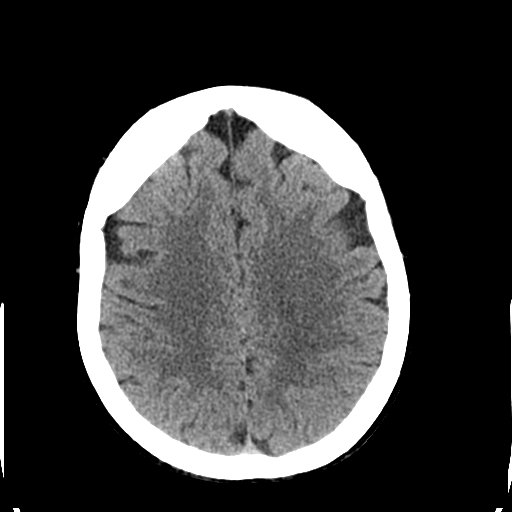
[im 25/33  brain]
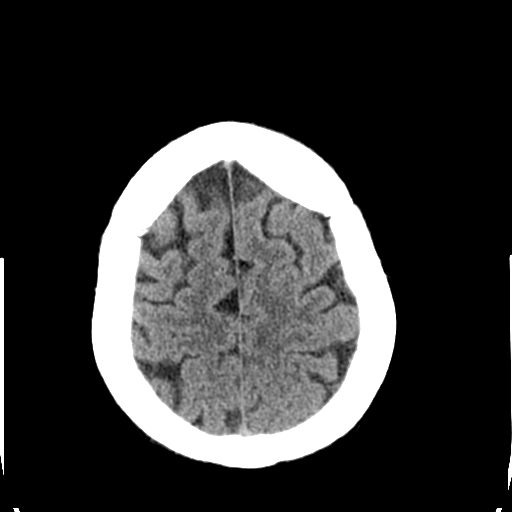
[im 27/33  brain]
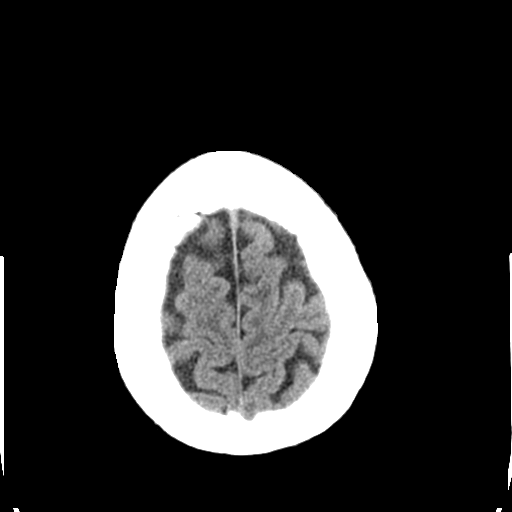
[im 27/33  bone]
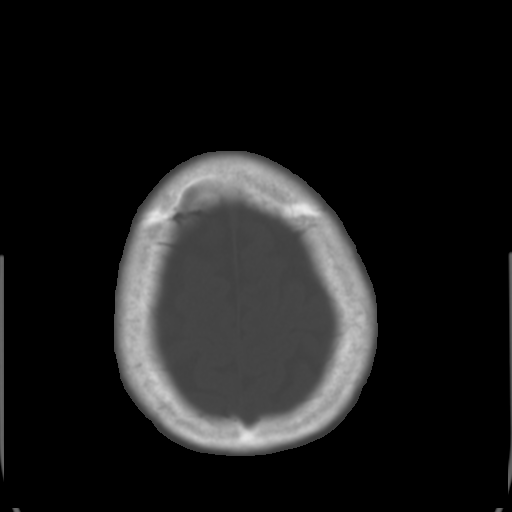
[im 30/33  brain]
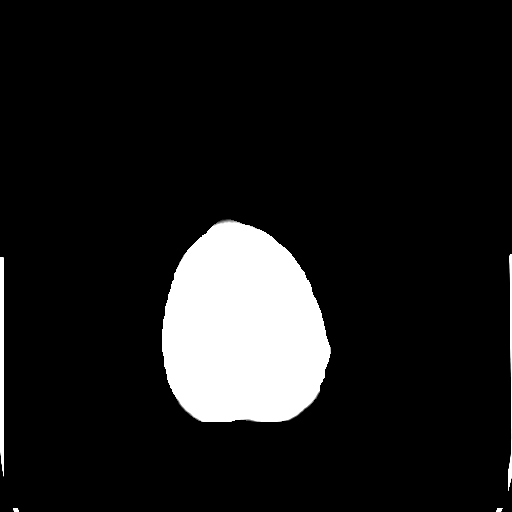

[Series 5: coronal soft tissue · coronal · 0.32mm/px · 3 of 68 slices shown]
[im 24/68  brain]
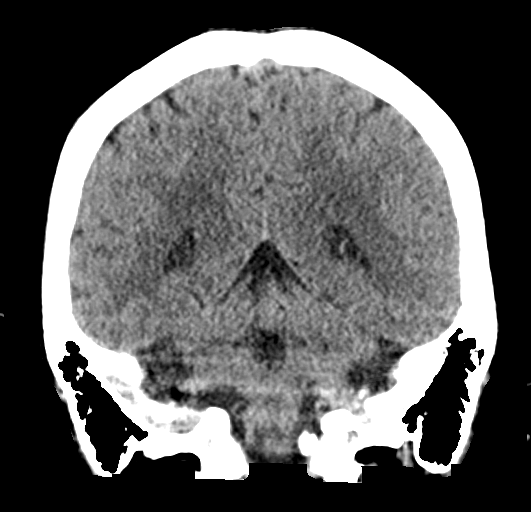
[im 31/68  brain]
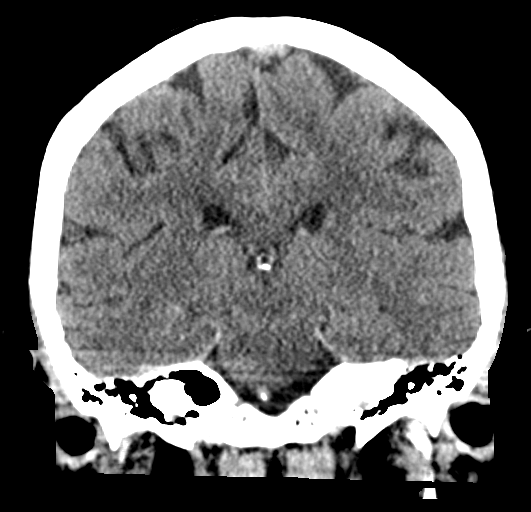
[im 38/68  brain]
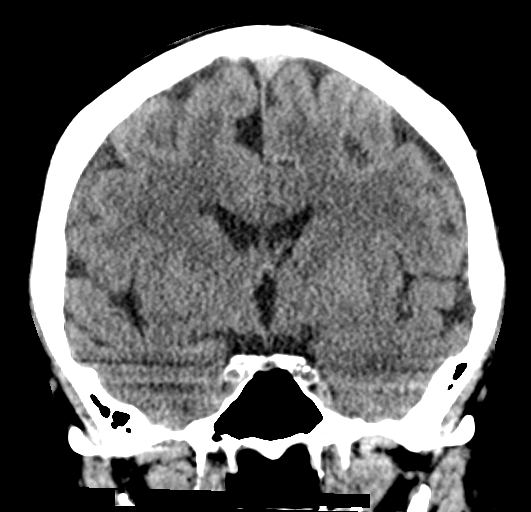

[Series 6: sagittal soft tissue · sagittal · 0.32mm/px · 3 of 57 slices shown]
[im 19/57  brain]
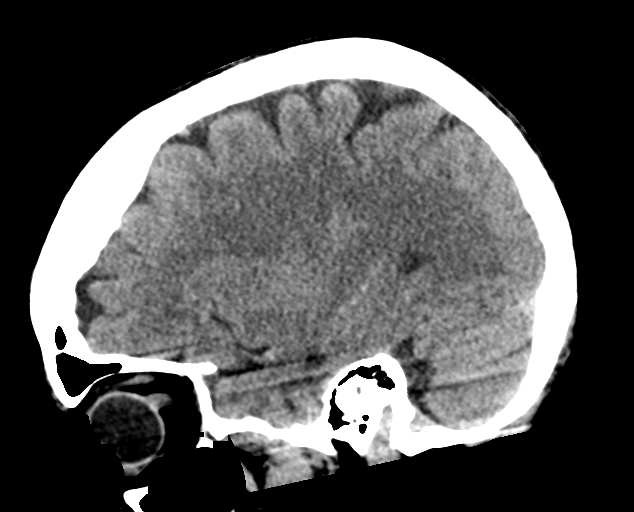
[im 29/57  brain]
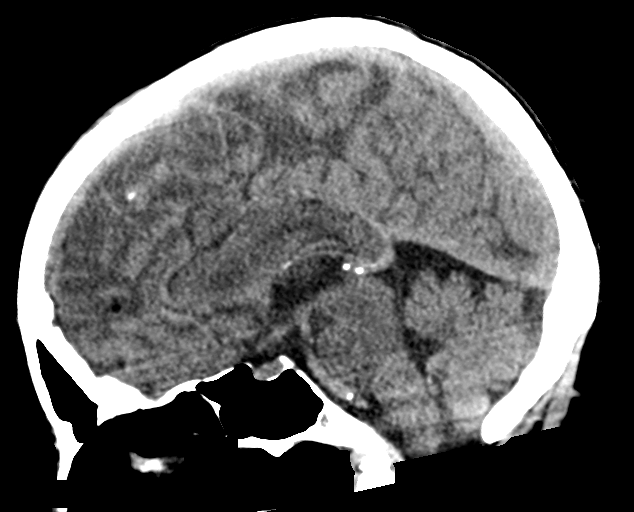
[im 38/57  brain]
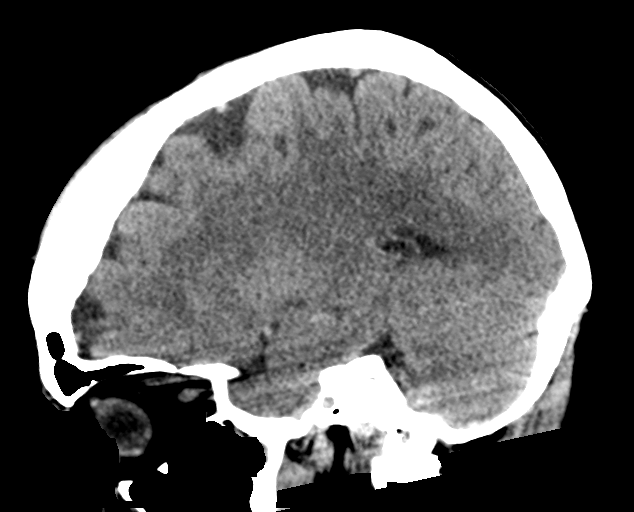

[16 of 47 positions shown; findings below may reference images not displayed]

FINDINGS: BRAIN: No intraparenchymal hemorrhage, mass effect nor midline
shift. No parenchymal brain volume loss for age. No hydrocephalus.
Patchy supratentorial white matter hypodensities less than expected
for patient's age, though non-specific are most compatible with
chronic small vessel ischemic disease. No acute large vascular
territory infarcts. No abnormal extra-axial fluid collections. Basal
cisterns are patent.

VASCULAR: Mild-to-moderate calcific atherosclerosis of the carotid
siphons.

SKULL: No skull fracture. Severe temporomandibular osteoarthrosis.
Minimal LEFT frontal scalp hematoma without subcutaneous gas or
radiopaque foreign bodies.

SINUSES/ORBITS: Trace paranasal sinus mucosal thickening. Mastoid
air cells are well aerated.The included ocular globes and orbital
contents are non-suspicious. Status post bilateral ocular lens
implants.

OTHER: None.
IMPRESSION: 1. No acute intracranial process. Minimal LEFT frontal scalp
hematoma.
2. Otherwise negative non-contrast CT HEAD for age.

## 2019-09-19 DIAGNOSIS — N390 Urinary tract infection, site not specified: Secondary | ICD-10-CM | POA: Diagnosis not present

## 2019-09-19 DIAGNOSIS — R300 Dysuria: Secondary | ICD-10-CM | POA: Diagnosis not present

## 2019-10-05 DIAGNOSIS — M170 Bilateral primary osteoarthritis of knee: Secondary | ICD-10-CM | POA: Diagnosis not present

## 2019-10-05 DIAGNOSIS — M1711 Unilateral primary osteoarthritis, right knee: Secondary | ICD-10-CM | POA: Diagnosis not present

## 2019-10-05 DIAGNOSIS — M1712 Unilateral primary osteoarthritis, left knee: Secondary | ICD-10-CM | POA: Diagnosis not present

## 2019-11-07 DIAGNOSIS — L570 Actinic keratosis: Secondary | ICD-10-CM | POA: Diagnosis not present

## 2019-11-07 DIAGNOSIS — D225 Melanocytic nevi of trunk: Secondary | ICD-10-CM | POA: Diagnosis not present

## 2019-11-07 DIAGNOSIS — D2361 Other benign neoplasm of skin of right upper limb, including shoulder: Secondary | ICD-10-CM | POA: Diagnosis not present

## 2019-11-07 DIAGNOSIS — L821 Other seborrheic keratosis: Secondary | ICD-10-CM | POA: Diagnosis not present

## 2019-11-07 DIAGNOSIS — D2272 Melanocytic nevi of left lower limb, including hip: Secondary | ICD-10-CM | POA: Diagnosis not present

## 2019-11-07 DIAGNOSIS — D2261 Melanocytic nevi of right upper limb, including shoulder: Secondary | ICD-10-CM | POA: Diagnosis not present

## 2019-11-07 DIAGNOSIS — D1801 Hemangioma of skin and subcutaneous tissue: Secondary | ICD-10-CM | POA: Diagnosis not present

## 2019-11-15 DIAGNOSIS — M170 Bilateral primary osteoarthritis of knee: Secondary | ICD-10-CM | POA: Diagnosis not present

## 2019-11-22 DIAGNOSIS — M170 Bilateral primary osteoarthritis of knee: Secondary | ICD-10-CM | POA: Diagnosis not present

## 2019-11-29 DIAGNOSIS — M1712 Unilateral primary osteoarthritis, left knee: Secondary | ICD-10-CM | POA: Diagnosis not present

## 2019-11-29 DIAGNOSIS — M1711 Unilateral primary osteoarthritis, right knee: Secondary | ICD-10-CM | POA: Diagnosis not present

## 2019-11-29 DIAGNOSIS — M170 Bilateral primary osteoarthritis of knee: Secondary | ICD-10-CM | POA: Diagnosis not present

## 2020-01-23 DIAGNOSIS — D692 Other nonthrombocytopenic purpura: Secondary | ICD-10-CM | POA: Diagnosis not present

## 2020-02-06 DIAGNOSIS — I10 Essential (primary) hypertension: Secondary | ICD-10-CM | POA: Diagnosis not present

## 2020-02-06 DIAGNOSIS — E669 Obesity, unspecified: Secondary | ICD-10-CM | POA: Diagnosis not present

## 2020-02-06 DIAGNOSIS — M1990 Unspecified osteoarthritis, unspecified site: Secondary | ICD-10-CM | POA: Diagnosis not present

## 2020-03-08 DIAGNOSIS — Z23 Encounter for immunization: Secondary | ICD-10-CM | POA: Diagnosis not present

## 2020-04-09 DIAGNOSIS — H52203 Unspecified astigmatism, bilateral: Secondary | ICD-10-CM | POA: Diagnosis not present

## 2020-04-09 DIAGNOSIS — Z961 Presence of intraocular lens: Secondary | ICD-10-CM | POA: Diagnosis not present

## 2020-04-09 DIAGNOSIS — H353131 Nonexudative age-related macular degeneration, bilateral, early dry stage: Secondary | ICD-10-CM | POA: Diagnosis not present

## 2020-08-11 DIAGNOSIS — E785 Hyperlipidemia, unspecified: Secondary | ICD-10-CM | POA: Diagnosis not present

## 2020-08-29 DIAGNOSIS — Z1331 Encounter for screening for depression: Secondary | ICD-10-CM | POA: Diagnosis not present

## 2020-08-29 DIAGNOSIS — E669 Obesity, unspecified: Secondary | ICD-10-CM | POA: Diagnosis not present

## 2020-08-29 DIAGNOSIS — M1990 Unspecified osteoarthritis, unspecified site: Secondary | ICD-10-CM | POA: Diagnosis not present

## 2020-08-29 DIAGNOSIS — R82998 Other abnormal findings in urine: Secondary | ICD-10-CM | POA: Diagnosis not present

## 2020-08-29 DIAGNOSIS — Z1339 Encounter for screening examination for other mental health and behavioral disorders: Secondary | ICD-10-CM | POA: Diagnosis not present

## 2020-08-29 DIAGNOSIS — E785 Hyperlipidemia, unspecified: Secondary | ICD-10-CM | POA: Diagnosis not present

## 2020-08-29 DIAGNOSIS — I10 Essential (primary) hypertension: Secondary | ICD-10-CM | POA: Diagnosis not present

## 2020-08-29 DIAGNOSIS — Z0000 Encounter for general adult medical examination without abnormal findings: Secondary | ICD-10-CM | POA: Diagnosis not present

## 2020-09-10 DIAGNOSIS — Z1231 Encounter for screening mammogram for malignant neoplasm of breast: Secondary | ICD-10-CM | POA: Diagnosis not present

## 2020-09-11 ENCOUNTER — Ambulatory Visit: Payer: PPO

## 2020-09-16 DIAGNOSIS — R928 Other abnormal and inconclusive findings on diagnostic imaging of breast: Secondary | ICD-10-CM | POA: Diagnosis not present

## 2020-09-16 DIAGNOSIS — R922 Inconclusive mammogram: Secondary | ICD-10-CM | POA: Diagnosis not present

## 2020-09-17 DIAGNOSIS — R059 Cough, unspecified: Secondary | ICD-10-CM | POA: Diagnosis not present

## 2020-09-17 DIAGNOSIS — J029 Acute pharyngitis, unspecified: Secondary | ICD-10-CM | POA: Diagnosis not present

## 2020-09-17 DIAGNOSIS — Z1152 Encounter for screening for COVID-19: Secondary | ICD-10-CM | POA: Diagnosis not present

## 2020-09-24 DIAGNOSIS — R059 Cough, unspecified: Secondary | ICD-10-CM | POA: Diagnosis not present

## 2020-09-24 DIAGNOSIS — J189 Pneumonia, unspecified organism: Secondary | ICD-10-CM | POA: Diagnosis not present

## 2020-09-24 DIAGNOSIS — J40 Bronchitis, not specified as acute or chronic: Secondary | ICD-10-CM | POA: Diagnosis not present

## 2020-10-09 DIAGNOSIS — J189 Pneumonia, unspecified organism: Secondary | ICD-10-CM | POA: Diagnosis not present

## 2020-11-06 ENCOUNTER — Encounter (HOSPITAL_BASED_OUTPATIENT_CLINIC_OR_DEPARTMENT_OTHER): Payer: Self-pay | Admitting: Obstetrics & Gynecology

## 2020-11-17 DIAGNOSIS — D225 Melanocytic nevi of trunk: Secondary | ICD-10-CM | POA: Diagnosis not present

## 2020-11-17 DIAGNOSIS — D2271 Melanocytic nevi of right lower limb, including hip: Secondary | ICD-10-CM | POA: Diagnosis not present

## 2020-11-17 DIAGNOSIS — D2272 Melanocytic nevi of left lower limb, including hip: Secondary | ICD-10-CM | POA: Diagnosis not present

## 2020-11-17 DIAGNOSIS — L814 Other melanin hyperpigmentation: Secondary | ICD-10-CM | POA: Diagnosis not present

## 2020-11-17 DIAGNOSIS — D485 Neoplasm of uncertain behavior of skin: Secondary | ICD-10-CM | POA: Diagnosis not present

## 2020-11-17 DIAGNOSIS — L821 Other seborrheic keratosis: Secondary | ICD-10-CM | POA: Diagnosis not present

## 2020-11-17 DIAGNOSIS — D0471 Carcinoma in situ of skin of right lower limb, including hip: Secondary | ICD-10-CM | POA: Diagnosis not present

## 2021-01-27 DIAGNOSIS — M5136 Other intervertebral disc degeneration, lumbar region: Secondary | ICD-10-CM | POA: Diagnosis not present

## 2021-01-27 DIAGNOSIS — M9904 Segmental and somatic dysfunction of sacral region: Secondary | ICD-10-CM | POA: Diagnosis not present

## 2021-01-27 DIAGNOSIS — M9905 Segmental and somatic dysfunction of pelvic region: Secondary | ICD-10-CM | POA: Diagnosis not present

## 2021-01-27 DIAGNOSIS — M9903 Segmental and somatic dysfunction of lumbar region: Secondary | ICD-10-CM | POA: Diagnosis not present

## 2021-01-28 DIAGNOSIS — M9904 Segmental and somatic dysfunction of sacral region: Secondary | ICD-10-CM | POA: Diagnosis not present

## 2021-01-28 DIAGNOSIS — M9903 Segmental and somatic dysfunction of lumbar region: Secondary | ICD-10-CM | POA: Diagnosis not present

## 2021-01-28 DIAGNOSIS — M5136 Other intervertebral disc degeneration, lumbar region: Secondary | ICD-10-CM | POA: Diagnosis not present

## 2021-01-28 DIAGNOSIS — M9905 Segmental and somatic dysfunction of pelvic region: Secondary | ICD-10-CM | POA: Diagnosis not present

## 2021-01-29 DIAGNOSIS — M5136 Other intervertebral disc degeneration, lumbar region: Secondary | ICD-10-CM | POA: Diagnosis not present

## 2021-01-29 DIAGNOSIS — M9905 Segmental and somatic dysfunction of pelvic region: Secondary | ICD-10-CM | POA: Diagnosis not present

## 2021-01-29 DIAGNOSIS — M9903 Segmental and somatic dysfunction of lumbar region: Secondary | ICD-10-CM | POA: Diagnosis not present

## 2021-01-29 DIAGNOSIS — M9904 Segmental and somatic dysfunction of sacral region: Secondary | ICD-10-CM | POA: Diagnosis not present

## 2021-02-02 DIAGNOSIS — M9904 Segmental and somatic dysfunction of sacral region: Secondary | ICD-10-CM | POA: Diagnosis not present

## 2021-02-02 DIAGNOSIS — M9905 Segmental and somatic dysfunction of pelvic region: Secondary | ICD-10-CM | POA: Diagnosis not present

## 2021-02-02 DIAGNOSIS — M9903 Segmental and somatic dysfunction of lumbar region: Secondary | ICD-10-CM | POA: Diagnosis not present

## 2021-02-02 DIAGNOSIS — M5136 Other intervertebral disc degeneration, lumbar region: Secondary | ICD-10-CM | POA: Diagnosis not present

## 2021-02-03 DIAGNOSIS — M9904 Segmental and somatic dysfunction of sacral region: Secondary | ICD-10-CM | POA: Diagnosis not present

## 2021-02-03 DIAGNOSIS — M5136 Other intervertebral disc degeneration, lumbar region: Secondary | ICD-10-CM | POA: Diagnosis not present

## 2021-02-03 DIAGNOSIS — M9903 Segmental and somatic dysfunction of lumbar region: Secondary | ICD-10-CM | POA: Diagnosis not present

## 2021-02-03 DIAGNOSIS — M9905 Segmental and somatic dysfunction of pelvic region: Secondary | ICD-10-CM | POA: Diagnosis not present

## 2021-02-04 DIAGNOSIS — M9905 Segmental and somatic dysfunction of pelvic region: Secondary | ICD-10-CM | POA: Diagnosis not present

## 2021-02-04 DIAGNOSIS — M9904 Segmental and somatic dysfunction of sacral region: Secondary | ICD-10-CM | POA: Diagnosis not present

## 2021-02-04 DIAGNOSIS — M5136 Other intervertebral disc degeneration, lumbar region: Secondary | ICD-10-CM | POA: Diagnosis not present

## 2021-02-04 DIAGNOSIS — M9903 Segmental and somatic dysfunction of lumbar region: Secondary | ICD-10-CM | POA: Diagnosis not present

## 2021-02-05 DIAGNOSIS — M5136 Other intervertebral disc degeneration, lumbar region: Secondary | ICD-10-CM | POA: Diagnosis not present

## 2021-02-05 DIAGNOSIS — M9903 Segmental and somatic dysfunction of lumbar region: Secondary | ICD-10-CM | POA: Diagnosis not present

## 2021-02-05 DIAGNOSIS — M9905 Segmental and somatic dysfunction of pelvic region: Secondary | ICD-10-CM | POA: Diagnosis not present

## 2021-02-05 DIAGNOSIS — M9904 Segmental and somatic dysfunction of sacral region: Secondary | ICD-10-CM | POA: Diagnosis not present

## 2021-02-10 DIAGNOSIS — M5136 Other intervertebral disc degeneration, lumbar region: Secondary | ICD-10-CM | POA: Diagnosis not present

## 2021-02-10 DIAGNOSIS — M9904 Segmental and somatic dysfunction of sacral region: Secondary | ICD-10-CM | POA: Diagnosis not present

## 2021-02-10 DIAGNOSIS — M9905 Segmental and somatic dysfunction of pelvic region: Secondary | ICD-10-CM | POA: Diagnosis not present

## 2021-02-10 DIAGNOSIS — M9903 Segmental and somatic dysfunction of lumbar region: Secondary | ICD-10-CM | POA: Diagnosis not present

## 2021-02-11 DIAGNOSIS — M9903 Segmental and somatic dysfunction of lumbar region: Secondary | ICD-10-CM | POA: Diagnosis not present

## 2021-02-11 DIAGNOSIS — M5136 Other intervertebral disc degeneration, lumbar region: Secondary | ICD-10-CM | POA: Diagnosis not present

## 2021-02-11 DIAGNOSIS — M9905 Segmental and somatic dysfunction of pelvic region: Secondary | ICD-10-CM | POA: Diagnosis not present

## 2021-02-11 DIAGNOSIS — M9904 Segmental and somatic dysfunction of sacral region: Secondary | ICD-10-CM | POA: Diagnosis not present

## 2021-02-12 DIAGNOSIS — M9903 Segmental and somatic dysfunction of lumbar region: Secondary | ICD-10-CM | POA: Diagnosis not present

## 2021-02-12 DIAGNOSIS — M9905 Segmental and somatic dysfunction of pelvic region: Secondary | ICD-10-CM | POA: Diagnosis not present

## 2021-02-12 DIAGNOSIS — M9904 Segmental and somatic dysfunction of sacral region: Secondary | ICD-10-CM | POA: Diagnosis not present

## 2021-02-12 DIAGNOSIS — M5136 Other intervertebral disc degeneration, lumbar region: Secondary | ICD-10-CM | POA: Diagnosis not present

## 2021-02-16 DIAGNOSIS — M9904 Segmental and somatic dysfunction of sacral region: Secondary | ICD-10-CM | POA: Diagnosis not present

## 2021-02-16 DIAGNOSIS — M5136 Other intervertebral disc degeneration, lumbar region: Secondary | ICD-10-CM | POA: Diagnosis not present

## 2021-02-16 DIAGNOSIS — M9903 Segmental and somatic dysfunction of lumbar region: Secondary | ICD-10-CM | POA: Diagnosis not present

## 2021-02-16 DIAGNOSIS — M9905 Segmental and somatic dysfunction of pelvic region: Secondary | ICD-10-CM | POA: Diagnosis not present

## 2021-02-17 DIAGNOSIS — M5136 Other intervertebral disc degeneration, lumbar region: Secondary | ICD-10-CM | POA: Diagnosis not present

## 2021-02-17 DIAGNOSIS — M9905 Segmental and somatic dysfunction of pelvic region: Secondary | ICD-10-CM | POA: Diagnosis not present

## 2021-02-17 DIAGNOSIS — M9904 Segmental and somatic dysfunction of sacral region: Secondary | ICD-10-CM | POA: Diagnosis not present

## 2021-02-17 DIAGNOSIS — M9903 Segmental and somatic dysfunction of lumbar region: Secondary | ICD-10-CM | POA: Diagnosis not present

## 2021-02-18 DIAGNOSIS — M9903 Segmental and somatic dysfunction of lumbar region: Secondary | ICD-10-CM | POA: Diagnosis not present

## 2021-02-18 DIAGNOSIS — M9905 Segmental and somatic dysfunction of pelvic region: Secondary | ICD-10-CM | POA: Diagnosis not present

## 2021-02-18 DIAGNOSIS — M9904 Segmental and somatic dysfunction of sacral region: Secondary | ICD-10-CM | POA: Diagnosis not present

## 2021-02-18 DIAGNOSIS — M5136 Other intervertebral disc degeneration, lumbar region: Secondary | ICD-10-CM | POA: Diagnosis not present

## 2021-02-19 DIAGNOSIS — M9903 Segmental and somatic dysfunction of lumbar region: Secondary | ICD-10-CM | POA: Diagnosis not present

## 2021-02-19 DIAGNOSIS — M9905 Segmental and somatic dysfunction of pelvic region: Secondary | ICD-10-CM | POA: Diagnosis not present

## 2021-02-19 DIAGNOSIS — M5136 Other intervertebral disc degeneration, lumbar region: Secondary | ICD-10-CM | POA: Diagnosis not present

## 2021-02-19 DIAGNOSIS — M9904 Segmental and somatic dysfunction of sacral region: Secondary | ICD-10-CM | POA: Diagnosis not present

## 2021-03-02 DIAGNOSIS — Z23 Encounter for immunization: Secondary | ICD-10-CM | POA: Diagnosis not present

## 2021-03-02 DIAGNOSIS — M4800 Spinal stenosis, site unspecified: Secondary | ICD-10-CM | POA: Diagnosis not present

## 2021-03-02 DIAGNOSIS — I10 Essential (primary) hypertension: Secondary | ICD-10-CM | POA: Diagnosis not present

## 2021-03-02 DIAGNOSIS — M1990 Unspecified osteoarthritis, unspecified site: Secondary | ICD-10-CM | POA: Diagnosis not present

## 2021-03-03 DIAGNOSIS — M47816 Spondylosis without myelopathy or radiculopathy, lumbar region: Secondary | ICD-10-CM | POA: Diagnosis not present

## 2021-03-03 DIAGNOSIS — M47817 Spondylosis without myelopathy or radiculopathy, lumbosacral region: Secondary | ICD-10-CM | POA: Diagnosis not present

## 2021-03-03 DIAGNOSIS — M5126 Other intervertebral disc displacement, lumbar region: Secondary | ICD-10-CM | POA: Diagnosis not present

## 2021-03-03 DIAGNOSIS — M4316 Spondylolisthesis, lumbar region: Secondary | ICD-10-CM | POA: Diagnosis not present

## 2021-03-03 DIAGNOSIS — M48061 Spinal stenosis, lumbar region without neurogenic claudication: Secondary | ICD-10-CM | POA: Diagnosis not present

## 2021-03-03 DIAGNOSIS — M419 Scoliosis, unspecified: Secondary | ICD-10-CM | POA: Diagnosis not present

## 2021-03-13 DIAGNOSIS — I10 Essential (primary) hypertension: Secondary | ICD-10-CM | POA: Diagnosis not present

## 2021-03-13 DIAGNOSIS — M4150 Other secondary scoliosis, site unspecified: Secondary | ICD-10-CM | POA: Diagnosis not present

## 2021-03-13 DIAGNOSIS — M5431 Sciatica, right side: Secondary | ICD-10-CM | POA: Diagnosis not present

## 2021-03-13 DIAGNOSIS — M48062 Spinal stenosis, lumbar region with neurogenic claudication: Secondary | ICD-10-CM | POA: Diagnosis not present

## 2021-03-25 DIAGNOSIS — M48062 Spinal stenosis, lumbar region with neurogenic claudication: Secondary | ICD-10-CM | POA: Diagnosis not present

## 2021-03-25 DIAGNOSIS — M4316 Spondylolisthesis, lumbar region: Secondary | ICD-10-CM | POA: Diagnosis not present

## 2021-03-26 DIAGNOSIS — M48062 Spinal stenosis, lumbar region with neurogenic claudication: Secondary | ICD-10-CM | POA: Diagnosis not present

## 2021-04-20 DIAGNOSIS — H353131 Nonexudative age-related macular degeneration, bilateral, early dry stage: Secondary | ICD-10-CM | POA: Diagnosis not present

## 2021-04-20 DIAGNOSIS — H1789 Other corneal scars and opacities: Secondary | ICD-10-CM | POA: Diagnosis not present

## 2021-04-20 DIAGNOSIS — Z961 Presence of intraocular lens: Secondary | ICD-10-CM | POA: Diagnosis not present

## 2021-04-20 DIAGNOSIS — H524 Presbyopia: Secondary | ICD-10-CM | POA: Diagnosis not present

## 2021-05-05 DIAGNOSIS — M4316 Spondylolisthesis, lumbar region: Secondary | ICD-10-CM | POA: Diagnosis not present

## 2021-05-05 DIAGNOSIS — M48062 Spinal stenosis, lumbar region with neurogenic claudication: Secondary | ICD-10-CM | POA: Diagnosis not present

## 2021-06-05 DIAGNOSIS — E785 Hyperlipidemia, unspecified: Secondary | ICD-10-CM | POA: Diagnosis not present

## 2021-06-05 DIAGNOSIS — M1990 Unspecified osteoarthritis, unspecified site: Secondary | ICD-10-CM | POA: Diagnosis not present

## 2021-06-05 DIAGNOSIS — I10 Essential (primary) hypertension: Secondary | ICD-10-CM | POA: Diagnosis not present

## 2021-06-29 DIAGNOSIS — M48062 Spinal stenosis, lumbar region with neurogenic claudication: Secondary | ICD-10-CM | POA: Diagnosis not present

## 2021-07-22 DIAGNOSIS — H9202 Otalgia, left ear: Secondary | ICD-10-CM | POA: Diagnosis not present

## 2021-07-22 DIAGNOSIS — H6523 Chronic serous otitis media, bilateral: Secondary | ICD-10-CM | POA: Diagnosis not present

## 2021-08-04 DIAGNOSIS — M48062 Spinal stenosis, lumbar region with neurogenic claudication: Secondary | ICD-10-CM | POA: Diagnosis not present

## 2021-08-26 DIAGNOSIS — E785 Hyperlipidemia, unspecified: Secondary | ICD-10-CM | POA: Diagnosis not present

## 2021-08-26 DIAGNOSIS — I10 Essential (primary) hypertension: Secondary | ICD-10-CM | POA: Diagnosis not present

## 2021-09-02 DIAGNOSIS — Z0000 Encounter for general adult medical examination without abnormal findings: Secondary | ICD-10-CM | POA: Diagnosis not present

## 2021-09-02 DIAGNOSIS — Z1331 Encounter for screening for depression: Secondary | ICD-10-CM | POA: Diagnosis not present

## 2021-09-02 DIAGNOSIS — E785 Hyperlipidemia, unspecified: Secondary | ICD-10-CM | POA: Diagnosis not present

## 2021-09-02 DIAGNOSIS — Z1339 Encounter for screening examination for other mental health and behavioral disorders: Secondary | ICD-10-CM | POA: Diagnosis not present

## 2021-09-02 DIAGNOSIS — Z23 Encounter for immunization: Secondary | ICD-10-CM | POA: Diagnosis not present

## 2021-09-02 DIAGNOSIS — R82998 Other abnormal findings in urine: Secondary | ICD-10-CM | POA: Diagnosis not present

## 2021-09-02 DIAGNOSIS — I10 Essential (primary) hypertension: Secondary | ICD-10-CM | POA: Diagnosis not present

## 2021-09-02 DIAGNOSIS — E669 Obesity, unspecified: Secondary | ICD-10-CM | POA: Diagnosis not present

## 2021-09-02 DIAGNOSIS — M4800 Spinal stenosis, site unspecified: Secondary | ICD-10-CM | POA: Diagnosis not present

## 2021-09-16 DIAGNOSIS — Z1231 Encounter for screening mammogram for malignant neoplasm of breast: Secondary | ICD-10-CM | POA: Diagnosis not present

## 2021-09-18 DIAGNOSIS — R928 Other abnormal and inconclusive findings on diagnostic imaging of breast: Secondary | ICD-10-CM | POA: Diagnosis not present

## 2021-10-19 DIAGNOSIS — M48062 Spinal stenosis, lumbar region with neurogenic claudication: Secondary | ICD-10-CM | POA: Diagnosis not present

## 2021-11-17 DIAGNOSIS — L821 Other seborrheic keratosis: Secondary | ICD-10-CM | POA: Diagnosis not present

## 2021-11-17 DIAGNOSIS — L570 Actinic keratosis: Secondary | ICD-10-CM | POA: Diagnosis not present

## 2021-11-17 DIAGNOSIS — D225 Melanocytic nevi of trunk: Secondary | ICD-10-CM | POA: Diagnosis not present

## 2021-11-17 DIAGNOSIS — D0461 Carcinoma in situ of skin of right upper limb, including shoulder: Secondary | ICD-10-CM | POA: Diagnosis not present

## 2021-11-17 DIAGNOSIS — D485 Neoplasm of uncertain behavior of skin: Secondary | ICD-10-CM | POA: Diagnosis not present

## 2021-11-17 DIAGNOSIS — Z85828 Personal history of other malignant neoplasm of skin: Secondary | ICD-10-CM | POA: Diagnosis not present

## 2021-11-23 ENCOUNTER — Other Ambulatory Visit (HOSPITAL_COMMUNITY)
Admission: RE | Admit: 2021-11-23 | Discharge: 2021-11-23 | Disposition: A | Discharge status: Home / Self Care | Payer: PPO | Source: Ambulatory Visit | Attending: Obstetrics & Gynecology | Admitting: Obstetrics & Gynecology

## 2021-11-23 ENCOUNTER — Ambulatory Visit (INDEPENDENT_AMBULATORY_CARE_PROVIDER_SITE_OTHER): Payer: PPO | Admitting: Obstetrics & Gynecology

## 2021-11-23 ENCOUNTER — Other Ambulatory Visit (HOSPITAL_BASED_OUTPATIENT_CLINIC_OR_DEPARTMENT_OTHER): Payer: Self-pay

## 2021-11-23 ENCOUNTER — Encounter (HOSPITAL_BASED_OUTPATIENT_CLINIC_OR_DEPARTMENT_OTHER): Payer: Self-pay | Admitting: Obstetrics & Gynecology

## 2021-11-23 ENCOUNTER — Other Ambulatory Visit: Payer: Self-pay | Admitting: Obstetrics & Gynecology

## 2021-11-23 VITALS — BP 155/76 | HR 103 | Ht 61.5 in | Wt 173.8 lb

## 2021-11-23 DIAGNOSIS — R319 Hematuria, unspecified: Secondary | ICD-10-CM | POA: Diagnosis not present

## 2021-11-23 DIAGNOSIS — N766 Ulceration of vulva: Secondary | ICD-10-CM

## 2021-11-23 DIAGNOSIS — R3129 Other microscopic hematuria: Secondary | ICD-10-CM | POA: Diagnosis not present

## 2021-11-23 DIAGNOSIS — R300 Dysuria: Secondary | ICD-10-CM

## 2021-11-23 DIAGNOSIS — L299 Pruritus, unspecified: Secondary | ICD-10-CM

## 2021-11-23 LAB — POCT URINALYSIS DIPSTICK
Appearance: NORMAL
Bilirubin, UA: NEGATIVE
Glucose, UA: NEGATIVE
Ketones, UA: NEGATIVE
Nitrite, UA: NEGATIVE
Protein, UA: NEGATIVE
Spec Grav, UA: 1.015 (ref 1.010–1.025)
Urobilinogen, UA: 0.2 E.U./dL
pH, UA: 5.5 (ref 5.0–8.0)

## 2021-11-23 MED ORDER — VALACYCLOVIR HCL 1 G PO TABS
1000.0000 mg | ORAL_TABLET | Freq: Two times a day (BID) | ORAL | 0 refills | Status: DC
  Filled 2021-11-23: qty 20, 10d supply, fill #0

## 2021-11-23 MED ORDER — ACYCLOVIR 5 % EX OINT
1.0000 | TOPICAL_OINTMENT | CUTANEOUS | 0 refills | Status: DC
  Filled 2021-11-23: qty 15, 10d supply, fill #0

## 2021-11-23 NOTE — Progress Notes (Unsigned)
GYNECOLOGY  VISIT  CC:   ***  HPI: 87 y.o. G1P1 Widowed White or Caucasian female here for ***.   Past Medical History:  Diagnosis Date   Arthritis    GERD (gastroesophageal reflux disease)    indigestion   History of blood transfusion    as an infant   History of hiatal hernia    History of pneumonia    Hyperlipidemia    Hypertension    Renal cyst     MEDS:   Current Outpatient Medications on File Prior to Visit  Medication Sig Dispense Refill   acetaminophen (TYLENOL) 650 MG CR tablet Take 1,300 mg by mouth 2 (two) times daily.     amLODipine (NORVASC) 5 MG tablet Take 5 mg by mouth 2 (two) times daily.      aspirin 81 MG tablet Take 81 mg by mouth at bedtime.      atenolol (TENORMIN) 50 MG tablet Take 50 mg by mouth daily.     atorvastatin (LIPITOR) 10 MG tablet Take 10 mg by mouth daily at 6 PM.      benazepril (LOTENSIN) 10 MG tablet Take 10 mg by mouth 2 (two) times daily.      diclofenac sodium (VOLTAREN) 1 % GEL Apply 1 application topically at bedtime.     famotidine (PEPCID) 10 MG tablet Take 10 mg by mouth at bedtime as needed for heartburn or indigestion.     magnesium gluconate (MAGONATE) 500 MG tablet Take 500 mg by mouth at bedtime.     Potassium 99 MG TABS Take 99 mg by mouth at bedtime.     No current facility-administered medications on file prior to visit.    ALLERGIES: Patient has no known allergies.  SH:  ***  ROS  PHYSICAL EXAMINATION:    BP (!) 155/76 (BP Location: Right Arm, Patient Position: Sitting, Cuff Size: Large)   Pulse (!) 103   Ht 5' 1.5" (1.562 m) Comment: reported  Wt 173 lb 12.8 oz (78.8 kg)   LMP 06/08/1983   BMI 32.31 kg/m     General appearance: alert, cooperative and appears stated age Neck: no adenopathy, supple, symmetrical, trachea midline and thyroid {CHL AMB PHY EX THYROID NORM [phone]::"normal to inspection and palpation"} CV:  {Exam; heart brief:31539} Lungs:  {pe lungs ob:314451} Breasts: {Exam;  breast:13139::"normal appearance, no masses or tenderness"} Abdomen: soft, non-tender; bowel sounds normal; no masses,  no organomegaly Lymph:  no inguinal LAD noted  Pelvic: External genitalia:  no lesions              Urethra:  normal appearing urethra with no masses, tenderness or lesions              Bartholins and Skenes: normal                 Vagina: normal appearing vagina with normal color and discharge, no lesions              Cervix: {CHL AMB PHY EX CERVIX NORM [phone]::"no lesions"}              Bimanual Exam:  Uterus:  {CHL AMB PHY EX UTERUS NORM [phone]::"normal size, contour, position, consistency, mobility, non-tender"}              Adnexa: {CHL AMB PHY EX ADNEXA NO MASS [phone]::"no mass, fullness, tenderness"}              Rectovaginal: {yes no:[id number]}.  Confirms.  Anus:  normal sphincter tone, no lesions  Chaperone, ***, CMA, was present for exam.  Assessment/Plan: There are no diagnoses linked to this encounter.

## 2021-11-24 LAB — CERVICOVAGINAL ANCILLARY ONLY
Candida Glabrata: NEGATIVE
Candida Vaginitis: NEGATIVE
Comment: NEGATIVE
Comment: NEGATIVE

## 2021-11-25 ENCOUNTER — Telehealth (HOSPITAL_BASED_OUTPATIENT_CLINIC_OR_DEPARTMENT_OTHER): Payer: Self-pay | Admitting: Obstetrics & Gynecology

## 2021-11-25 NOTE — Telephone Encounter (Signed)
Patient called and stated she would like for someone to please call her to go over her results because she unable to get in her my chart .

## 2021-11-25 NOTE — Telephone Encounter (Signed)
DOB verified. Pt called asking about lab results. Advised that vaginitis testing was negative for yeast. Advised that testing for HSV and urine culture had not yet resulted and we would reach out to her once the results came back. Pt verbalized understanding.

## 2021-11-26 LAB — CERVICOVAGINAL ANCILLARY ONLY
Comment: NEGATIVE
HSV1: NEGATIVE

## 2021-11-26 LAB — URINE CULTURE

## 2021-12-18 ENCOUNTER — Ambulatory Visit (HOSPITAL_BASED_OUTPATIENT_CLINIC_OR_DEPARTMENT_OTHER): Payer: PPO | Admitting: Obstetrics & Gynecology

## 2022-02-18 DIAGNOSIS — M48062 Spinal stenosis, lumbar region with neurogenic claudication: Secondary | ICD-10-CM | POA: Diagnosis not present

## 2022-03-08 DIAGNOSIS — H353132 Nonexudative age-related macular degeneration, bilateral, intermediate dry stage: Secondary | ICD-10-CM | POA: Diagnosis not present

## 2022-03-08 DIAGNOSIS — Z961 Presence of intraocular lens: Secondary | ICD-10-CM | POA: Diagnosis not present

## 2022-03-08 DIAGNOSIS — I10 Essential (primary) hypertension: Secondary | ICD-10-CM | POA: Diagnosis not present

## 2022-03-08 DIAGNOSIS — E669 Obesity, unspecified: Secondary | ICD-10-CM | POA: Diagnosis not present

## 2022-03-23 ENCOUNTER — Emergency Department (HOSPITAL_BASED_OUTPATIENT_CLINIC_OR_DEPARTMENT_OTHER): Payer: PPO

## 2022-03-23 ENCOUNTER — Encounter (HOSPITAL_BASED_OUTPATIENT_CLINIC_OR_DEPARTMENT_OTHER): Payer: Self-pay | Admitting: Emergency Medicine

## 2022-03-23 ENCOUNTER — Observation Stay (HOSPITAL_BASED_OUTPATIENT_CLINIC_OR_DEPARTMENT_OTHER)
Admission: EM | Admit: 2022-03-23 | Discharge: 2022-03-26 | Disposition: A | Discharge status: Home / Self Care | Payer: PPO | Attending: Internal Medicine | Admitting: Internal Medicine

## 2022-03-23 ENCOUNTER — Other Ambulatory Visit: Payer: Self-pay

## 2022-03-23 DIAGNOSIS — R55 Syncope and collapse: Secondary | ICD-10-CM | POA: Diagnosis not present

## 2022-03-23 DIAGNOSIS — Z7982 Long term (current) use of aspirin: Secondary | ICD-10-CM | POA: Insufficient documentation

## 2022-03-23 DIAGNOSIS — D72829 Elevated white blood cell count, unspecified: Secondary | ICD-10-CM | POA: Diagnosis present

## 2022-03-23 DIAGNOSIS — M545 Low back pain, unspecified: Secondary | ICD-10-CM | POA: Diagnosis not present

## 2022-03-23 DIAGNOSIS — R739 Hyperglycemia, unspecified: Secondary | ICD-10-CM | POA: Diagnosis present

## 2022-03-23 DIAGNOSIS — K449 Diaphragmatic hernia without obstruction or gangrene: Secondary | ICD-10-CM | POA: Diagnosis not present

## 2022-03-23 DIAGNOSIS — R930 Abnormal findings on diagnostic imaging of skull and head, not elsewhere classified: Secondary | ICD-10-CM | POA: Diagnosis present

## 2022-03-23 DIAGNOSIS — Z96643 Presence of artificial hip joint, bilateral: Secondary | ICD-10-CM | POA: Diagnosis not present

## 2022-03-23 DIAGNOSIS — Z79899 Other long term (current) drug therapy: Secondary | ICD-10-CM | POA: Diagnosis not present

## 2022-03-23 DIAGNOSIS — K8020 Calculus of gallbladder without cholecystitis without obstruction: Secondary | ICD-10-CM | POA: Insufficient documentation

## 2022-03-23 DIAGNOSIS — K8689 Other specified diseases of pancreas: Secondary | ICD-10-CM | POA: Insufficient documentation

## 2022-03-23 DIAGNOSIS — J9811 Atelectasis: Secondary | ICD-10-CM | POA: Diagnosis not present

## 2022-03-23 DIAGNOSIS — I10 Essential (primary) hypertension: Secondary | ICD-10-CM | POA: Diagnosis not present

## 2022-03-23 DIAGNOSIS — K5730 Diverticulosis of large intestine without perforation or abscess without bleeding: Secondary | ICD-10-CM | POA: Diagnosis not present

## 2022-03-23 DIAGNOSIS — I700 Atherosclerosis of aorta: Secondary | ICD-10-CM | POA: Insufficient documentation

## 2022-03-23 DIAGNOSIS — G9389 Other specified disorders of brain: Secondary | ICD-10-CM | POA: Diagnosis not present

## 2022-03-23 DIAGNOSIS — Z96612 Presence of left artificial shoulder joint: Secondary | ICD-10-CM | POA: Insufficient documentation

## 2022-03-23 DIAGNOSIS — N179 Acute kidney failure, unspecified: Secondary | ICD-10-CM | POA: Diagnosis present

## 2022-03-23 DIAGNOSIS — I517 Cardiomegaly: Secondary | ICD-10-CM | POA: Insufficient documentation

## 2022-03-23 DIAGNOSIS — E785 Hyperlipidemia, unspecified: Secondary | ICD-10-CM | POA: Diagnosis present

## 2022-03-23 LAB — D-DIMER, QUANTITATIVE: D-Dimer, Quant: 0.57 ug/mL-FEU — ABNORMAL HIGH (ref 0.00–0.50)

## 2022-03-23 LAB — CBC
HCT: 45.9 % (ref 36.0–46.0)
Hemoglobin: 15.5 g/dL — ABNORMAL HIGH (ref 12.0–15.0)
MCH: 31.8 pg (ref 26.0–34.0)
MCHC: 33.8 g/dL (ref 30.0–36.0)
MCV: 94.1 fL (ref 80.0–100.0)
Platelets: 363 10*3/uL (ref 150–400)
RBC: 4.88 MIL/uL (ref 3.87–5.11)
RDW: 12.5 % (ref 11.5–15.5)
WBC: 16.1 10*3/uL — ABNORMAL HIGH (ref 4.0–10.5)
nRBC: 0 % (ref 0.0–0.2)

## 2022-03-23 LAB — BASIC METABOLIC PANEL
Anion gap: 20 — ABNORMAL HIGH (ref 5–15)
BUN: 34 mg/dL — ABNORMAL HIGH (ref 8–23)
CO2: 14 mmol/L — ABNORMAL LOW (ref 22–32)
Calcium: 10.3 mg/dL (ref 8.9–10.3)
Chloride: 101 mmol/L (ref 98–111)
Creatinine, Ser: 1.35 mg/dL — ABNORMAL HIGH (ref 0.44–1.00)
GFR, Estimated: 39 mL/min — ABNORMAL LOW (ref >60)
Glucose, Bld: 154 mg/dL — ABNORMAL HIGH (ref 70–99)
Potassium: 3.9 mmol/L (ref 3.5–5.1)
Sodium: 135 mmol/L (ref 135–145)

## 2022-03-23 LAB — URINALYSIS, ROUTINE W REFLEX MICROSCOPIC
Bilirubin Urine: NEGATIVE
Glucose, UA: NEGATIVE mg/dL
Ketones, ur: NEGATIVE mg/dL
Leukocytes,Ua: NEGATIVE
Nitrite: NEGATIVE
Specific Gravity, Urine: 1.016 (ref 1.005–1.030)
pH: 6 (ref 5.0–8.0)

## 2022-03-23 LAB — ETHANOL: Alcohol, Ethyl (B): <10 mg/dL (ref <10)

## 2022-03-23 LAB — SALICYLATE LEVEL: Salicylate Lvl: <7 mg/dL — ABNORMAL LOW (ref 7.0–30.0)

## 2022-03-23 LAB — I-STAT VENOUS BLOOD GAS, ED
Acid-base deficit: 2 mmol/L (ref 0.0–2.0)
Bicarbonate: 22.3 mmol/L (ref 20.0–28.0)
Calcium, Ion: 1.28 mmol/L (ref 1.15–1.40)
HCT: 41 % (ref 36.0–46.0)
Hemoglobin: 13.9 g/dL (ref 12.0–15.0)
O2 Saturation: 80 %
Potassium: 3.6 mmol/L (ref 3.5–5.1)
Sodium: 136 mmol/L (ref 135–145)
TCO2: 23 mmol/L (ref 22–32)
pCO2, Ven: 36.2 mmHg — ABNORMAL LOW (ref 44–60)
pH, Ven: 7.397 (ref 7.25–7.43)
pO2, Ven: 44 mmHg (ref 32–45)

## 2022-03-23 LAB — LACTIC ACID, PLASMA: Lactic Acid, Venous: 1.8 mmol/L (ref 0.5–1.9)

## 2022-03-23 LAB — TROPONIN I (HIGH SENSITIVITY)
Troponin I (High Sensitivity): 3 ng/L (ref <18)
Troponin I (High Sensitivity): 3 ng/L (ref <18)

## 2022-03-23 LAB — BETA-HYDROXYBUTYRIC ACID: Beta-Hydroxybutyric Acid: 0.77 mmol/L — ABNORMAL HIGH (ref 0.05–0.27)

## 2022-03-23 MED ORDER — ACETAMINOPHEN 325 MG PO TABS
650.0000 mg | ORAL_TABLET | Freq: Four times a day (QID) | ORAL | Status: DC | PRN
  Administered 2022-03-24: 650 mg via ORAL
  Filled 2022-03-23: qty 2

## 2022-03-23 MED ORDER — ATORVASTATIN CALCIUM 10 MG PO TABS
10.0000 mg | ORAL_TABLET | Freq: Every day | ORAL | Status: DC
  Administered 2022-03-24 – 2022-03-25 (×2): 10 mg via ORAL
  Filled 2022-03-23 (×2): qty 1

## 2022-03-23 MED ORDER — ONDANSETRON HCL 4 MG/2ML IJ SOLN: 4.0000 mg | Freq: Four times a day (QID) | INTRAMUSCULAR | Status: DC | PRN

## 2022-03-23 MED ORDER — SODIUM CHLORIDE 0.9% FLUSH
3.0000 mL | Freq: Two times a day (BID) | INTRAVENOUS | Status: DC
  Administered 2022-03-24 – 2022-03-26 (×4): 3 mL via INTRAVENOUS

## 2022-03-23 MED ORDER — FAMOTIDINE 10 MG PO TABS: 10.0000 mg | ORAL_TABLET | Freq: Every evening | ORAL | Status: DC | PRN

## 2022-03-23 MED ORDER — LACTATED RINGERS IV BOLUS
1000.0000 mL | Freq: Once | INTRAVENOUS | Status: AC
  Administered 2022-03-23: 1000 mL via INTRAVENOUS

## 2022-03-23 MED ORDER — ONDANSETRON HCL 4 MG PO TABS: 4.0000 mg | ORAL_TABLET | Freq: Four times a day (QID) | ORAL | Status: DC | PRN

## 2022-03-23 MED ORDER — LACTATED RINGERS IV SOLN: INTRAVENOUS | Status: AC

## 2022-03-23 MED ORDER — ENOXAPARIN SODIUM 30 MG/0.3ML IJ SOSY: 30.0000 mg | PREFILLED_SYRINGE | INTRAMUSCULAR | Status: DC

## 2022-03-23 MED ORDER — AMLODIPINE BESYLATE 5 MG PO TABS
5.0000 mg | ORAL_TABLET | Freq: Two times a day (BID) | ORAL | Status: DC
  Administered 2022-03-24 – 2022-03-26 (×5): 5 mg via ORAL
  Filled 2022-03-23 (×5): qty 1

## 2022-03-23 MED ORDER — ACETAMINOPHEN 650 MG RE SUPP: 650.0000 mg | Freq: Four times a day (QID) | RECTAL | Status: DC | PRN

## 2022-03-23 MED ORDER — SENNOSIDES-DOCUSATE SODIUM 8.6-50 MG PO TABS: 1.0000 | ORAL_TABLET | Freq: Every evening | ORAL | Status: DC | PRN

## 2022-03-23 MED ORDER — ATENOLOL 25 MG PO TABS
50.0000 mg | ORAL_TABLET | Freq: Every day | ORAL | Status: DC
  Administered 2022-03-23 – 2022-03-25 (×3): 50 mg via ORAL
  Filled 2022-03-23: qty 2
  Filled 2022-03-23 (×2): qty 1

## 2022-03-23 NOTE — Assessment & Plan Note (Addendum)
Presenting with syncope without warning.  Likely hypovolemic or vasovagal however cardiogenic also in differential.  Has history of first-degree AV block, CXR with cardiomegaly, and murmur noted on exam. -Keep on telemetry -Orthostatic vitals -Continue IV fluid hydration overnight -Obtain echocardiogram -PT/OT eval

## 2022-03-23 NOTE — H&P (Signed)
History and Physical    Lauren Hill:[id number] DOB: Dec 08, 1934 DOA: 03/23/2022  PCP: Burnard Bunting, MD  Patient coming from: Home  I have personally briefly reviewed patient's old medical records in [place]  Chief Complaint: Syncope  HPI: Lauren Hill is a 87 y.o. female with medical history significant for HTN, HLD, GERD, chronic leukocytosis, chronic back pain due to lumbar DDD who presented to the ED for evaluation after a syncopal episode.  Patient states she was sitting down in church morning of 03/23/2022.  She says she was on a wooden bench which was aggravating her chronic low back pain.  She felt hot and then passed out without any other warning.  She is not sure how long she was out but thinks probably a brief amount of time she was awoken by other members of the church.  She was not confused afterwards.  She has not had any associated chest pain, palpitations, dyspnea, diaphoresis, nausea, vomiting, abdominal pain, dysuria, diarrhea, focal weakness.   She feels back to her usual state of health at time of admission.  She has not had any recent changes in her medications.  [facility] ED Course  Labs/Imaging on admission: I have personally reviewed following labs and imaging studies.  Initial vitals showed BP 133/57, pulse 114, RR 18, temp 97.9 F, SPO2 97% on room air.  Labs show WBC 16.1, hemoglobin 15.5, platelets 363,000, sodium 135, potassium 3.9, bicarb 14, BUN 34, creatinine 1.35, serum glucose 154, D-dimer 0.57, troponin negative x2, lactic acid 1.8, salicylate level undetectable, serum ethanol undetectable, beta hydroxybutyrate 0.77.  Urinalysis negative for UTI.  Portable chest x-ray shows enlarged cardiac silhouette without pulmonary edema or focal consolidation.  CT head without contrast shows a new subcortical hypodensity in the right temporoparietal lobe representing either a white matter infarction or mass lesion.  No  midline shift or mass effect.  No intraparenchymal hemorrhage.  Patient was given 1 L of LR followed by maintenance fluids at 200 mL/hour x8 hours.  The hospitalist service was consulted to admit for further evaluation and management.  Review of Systems: All systems reviewed and are negative except as documented in history of present illness above.   Past Medical History:  Diagnosis Date   Arthritis    GERD (gastroesophageal reflux disease)    indigestion   History of blood transfusion    as an infant   History of hiatal hernia    History of pneumonia    Hyperlipidemia    Hypertension    Renal cyst     Past Surgical History:  Procedure Laterality Date   COLONOSCOPY     DILATION AND CURETTAGE OF UTERUS     x2   EYE SURGERY     cataracts   HIP SURGERY  5/05   right hip replacement   HIP SURGERY  5/11   left hip replacement   ORIF HUMERUS FRACTURE Left 11/11/2017   Procedure: OPEN REDUCTION INTERNAL FIXATION (ORIF) LEFT DISTAL HUMERUS FRACTURE;  Surgeon: Shona Needles, MD;  Location: [facility];  Service: Orthopedics;  Laterality: Left;   RHINOPLASTY     TONSILLECTOMY     TOTAL SHOULDER ARTHROPLASTY Left 04/01/2016   Procedure: LEFT TOTAL SHOULDER ARTHROPLASTY;  Surgeon: Justice Britain, MD;  Location: [place];  Service: Orthopedics;  Laterality: Left;    Social History:  reports that she has never smoked. She has never used smokeless tobacco. She reports that she does not drink alcohol and does not  use drugs.  No Known Allergies  Family History  Problem Relation Age of Onset   Heart disease Mother      Prior to Admission medications   Medication Sig Start Date End Date Taking? Authorizing Provider  acetaminophen (TYLENOL) 650 MG CR tablet Take 1,300 mg by mouth 2 (two) times daily.    [provider]  acyclovir ointment (ZOVIRAX) 5 % Apply 1 Application topically every 3 (three) hours. 11/23/21   Megan Salon, MD  amLODipine (NORVASC) 5 MG tablet Take 5 mg by  mouth 2 (two) times daily.  01/12/16   [provider]  aspirin 81 MG tablet Take 81 mg by mouth at bedtime.     [provider]  atenolol (TENORMIN) 50 MG tablet Take 50 mg by mouth daily.    [provider]  atorvastatin (LIPITOR) 10 MG tablet Take 10 mg by mouth daily at 6 PM.     [provider]  benazepril (LOTENSIN) 10 MG tablet Take 10 mg by mouth 2 (two) times daily.  01/12/16   [provider]  diclofenac sodium (VOLTAREN) 1 % GEL Apply 1 application topically at bedtime. 07/03/18   [provider]  famotidine (PEPCID) 10 MG tablet Take 10 mg by mouth at bedtime as needed for heartburn or indigestion.    [provider]  magnesium gluconate (MAGONATE) 500 MG tablet Take 500 mg by mouth at bedtime.    [provider]  olmesartan-hydrochlorothiazide (BENICAR HCT) 40-25 MG tablet  01/11/22   [provider]  Potassium 99 MG TABS Take 99 mg by mouth at bedtime.    [provider]  valACYclovir (VALTREX) 1000 MG tablet Take 1 tablet (1,000 mg total) by mouth 2 (two) times daily. 11/23/21   Megan Salon, MD    Physical Exam: Vitals:   03/23/22 1948 03/23/22 1949 03/23/22 2100 03/23/22 2202  BP: (!) 168/68  (!) 153/62 (!) 158/77  Pulse: 99  (!) 106 (!) 109  Resp: (!) 21  (!) 23 16  Temp:  98.6 F (37 C)  98.2 F (36.8 C)  TempSrc:  Oral  Oral  SpO2: 94%  96% 95%  Weight:      Height:       Constitutional: Resting supine in bed, NAD, calm, comfortable Eyes: EOMI, lids and conjunctivae normal ENMT: Mucous membranes are moist. Posterior pharynx clear of any exudate or lesions.Normal dentition.  Neck: normal, supple, no masses. Respiratory: clear to auscultation bilaterally, no wheezing, no crackles. Normal respiratory effort. No accessory muscle use.  Cardiovascular: Tachycardic, soft systolic murmur left upper sternal border. No extremity edema. 2+ pedal pulses. Abdomen: no tenderness, no masses  palpated. Musculoskeletal: no clubbing / cyanosis. No joint deformity upper and lower extremities. Good ROM, no contractures. Normal muscle tone.  Skin: no rashes, lesions, ulcers. No induration Neurologic: Sensation intact. Strength 5/5 in all 4.  Psychiatric: Normal judgment and insight. Alert and oriented x 3. Normal mood.   EKG: Personally reviewed. Sinus rhythm, LAFB, borderline prolonged PR interval.  Previous EKG showed sinus rhythm with first-degree AV block.  Assessment/Plan Principal Problem:   Syncope Active Problems:   Abnormal CT of the head   AKI (acute kidney injury) ([place])   Essential hypertension   Hyperlipidemia   Hyperglycemia   Leukocytosis   ALAYIA MEGGISON is a 87 y.o. female with medical history significant for HTN, HLD, GERD, chronic leukocytosis, chronic back pain due to lumbar DDD who is admitted for syncope evaluation.  Assessment and Plan: * Syncope Presenting with syncope without warning.  Likely hypovolemic or vasovagal however cardiogenic also in differential.  Has history of first-degree AV block, CXR with cardiomegaly, and murmur noted on exam. -Keep on telemetry -Orthostatic vitals -Continue IV fluid hydration overnight -Obtain echocardiogram -PT/OT eval  Abnormal CT of the head Noncontrast CT head showed a new subcortical hypodensity in the right temporoparietal lobe representing either a white matter infarction or mass lesion. -Obtain MRI brain without and with contrast for further characterization as recommended  AKI (acute kidney injury) ([place]) Creatinine 1.35 on admit, no recent prior for baseline comparison.  Likely hypovolemic. -Continue IV fluid hydration overnight -Monitor UOP -Hold home olmesartan-HCTZ  Leukocytosis Appears to have chronic leukocytosis on available labs.  No obvious active infection. -CBC with differential and smear review in a.m.  Hyperglycemia Mild hyperglycemia without DKA/HHS.  Beta hydroxybutyrate slightly  elevated, likely due to dehydration.  Will check A1c.  Hyperlipidemia Continue atorvastatin.  Essential hypertension Holding home olmesartan-HCTZ due to AKI.  Continue atenolol and amlodipine if orthostatics negative.  DVT prophylaxis: enoxaparin (LOVENOX) injection 30 mg Start: 03/24/22 2200 Code Status: Full code, confirmed with patient on admission Family Communication: Daughter at bedside Disposition Plan: From home and likely discharge to home pending clinical progress Consults called: None Severity of Illness: The appropriate patient status for this patient is OBSERVATION. Observation status is judged to be reasonable and necessary in order to provide the required intensity of service to ensure the patient's safety. The patient's presenting symptoms, physical exam findings, and initial radiographic and laboratory data in the context of their medical condition is felt to place them at decreased risk for further clinical deterioration. Furthermore, it is anticipated that the patient will be medically stable for discharge from the hospital within 2 midnights of admission.   Zada Finders MD [facility]  If 7PM-7AM, please contact night-coverage [email or web address]  03/23/2022, 11:24 PM

## 2022-03-23 NOTE — Assessment & Plan Note (Signed)
Mild hyperglycemia without DKA/HHS.  Beta hydroxybutyrate slightly elevated, likely due to dehydration.  Will check A1c.

## 2022-03-23 NOTE — Assessment & Plan Note (Signed)
Creatinine 1.35 on admit, no recent prior for baseline comparison.  Likely hypovolemic. -Continue IV fluid hydration overnight -Monitor UOP -Hold home olmesartan-HCTZ

## 2022-03-23 NOTE — Assessment & Plan Note (Signed)
Continue atorvastatin

## 2022-03-23 NOTE — Progress Notes (Signed)
Received call for possible transfer from [place]  Requesting physician: Dr. Tyrone Nine  Patient: Lauren Hill Date of birth: November 29, 1934  Reason for admission: Syncope  Patient reportedly came to the emergency room today after an episode of syncope.  Per ED physician, she has had decreased p.o. intake for the past few days.  She appears to be clinically dehydrated.  Her labs show that she does have a metabolic acidosis and anion gap.  She has had a bump in creatinine with a mild AKI.  Chest x-ray does not show any evidence of pneumonia.  Urine has been ordered and is currently pending.  Further work-up with lactate, beta hydroxybutyric acid, CT head currently in process.  She does not have any focal neurologic deficits.  Hemodynamically she is stable.  She has been started on IV fluids.  Cardiac enzymes are negative.  EKG without any signs of ischemia.  She will be admitted to a telemetry bed for observation for further work-up of syncope.  Hospitalist service will assume care when she arrives to [facility] long hospital.  Kathie Dike

## 2022-03-23 NOTE — ED Triage Notes (Signed)
Pt via pov from home after a syncopal episode this morning. Pt reports that she has had numerous episodes in the past, but it has been a while. She was evaluated years ago and was told it was related to blood sugar. Pt states EMS was called this morning and her cbg was 96. She believes this episode was related to pain from an uncomfortable seat. Pt alert & oriented, nad noted.

## 2022-03-23 NOTE — ED Notes (Signed)
Pt unable to give UA sample, states, "I haven't had anything to eat or drink all day, I cant go right now"

## 2022-03-23 NOTE — ED Provider Notes (Signed)
[facility] EMERGENCY DEPT Provider Note   CSN: [id number] Arrival date & time: 03/23/22  1149     History  Chief Complaint  Patient presents with   Loss of Consciousness    Lauren Hill is a 87 y.o. female.   Loss of Consciousness    87 year old female presenting to the the emergency department after syncopal episode this morning.  She states that she had no prodromal symptoms and suddenly passed out.  She is unsure if she is dehydrated.  She denies any melena or hematochezia.  She denies any chest pain, shortness of breath or help her palpitations.  She denies any headaches, fevers or chills, denies any cough.  She denies any weakness, numbness, facial droop.  She denies any difficulty ambulating.  She denies any abdominal pain.  She is tolerating oral intake.  Home Medications Prior to Admission medications   Medication Sig Start Date End Date Taking? Authorizing Provider  acetaminophen (TYLENOL) 650 MG CR tablet Take 1,300 mg by mouth 2 (two) times daily.    [provider]  acyclovir ointment (ZOVIRAX) 5 % Apply 1 Application topically every 3 (three) hours. 11/23/21   Megan Salon, MD  amLODipine (NORVASC) 5 MG tablet Take 5 mg by mouth 2 (two) times daily.  01/12/16   [provider]  aspirin 81 MG tablet Take 81 mg by mouth at bedtime.     [provider]  atenolol (TENORMIN) 50 MG tablet Take 50 mg by mouth daily.    [provider]  atorvastatin (LIPITOR) 10 MG tablet Take 10 mg by mouth daily at 6 PM.     [provider]  benazepril (LOTENSIN) 10 MG tablet Take 10 mg by mouth 2 (two) times daily.  01/12/16   [provider]  diclofenac sodium (VOLTAREN) 1 % GEL Apply 1 application topically at bedtime. 07/03/18   [provider]  famotidine (PEPCID) 10 MG tablet Take 10 mg by mouth at bedtime as needed for heartburn or indigestion.    [provider]  magnesium gluconate (MAGONATE)  500 MG tablet Take 500 mg by mouth at bedtime.    [provider]  olmesartan-hydrochlorothiazide (BENICAR HCT) 40-25 MG tablet  01/11/22   [provider]  Potassium 99 MG TABS Take 99 mg by mouth at bedtime.    [provider]  valACYclovir (VALTREX) 1000 MG tablet Take 1 tablet (1,000 mg total) by mouth 2 (two) times daily. 11/23/21   Megan Salon, MD      Allergies    Patient has no known allergies.    Review of Systems   Review of Systems  Cardiovascular:  Positive for syncope.  Neurological:  Positive for syncope.  All other systems reviewed and are negative.   Physical Exam Updated Vital Signs BP (!) 133/57 (BP Location: Left Arm)   Pulse (!) 114   Temp 97.9 F (36.6 C) (Oral)   Resp 18   Ht '5\' 1"'$  (1.549 m)   Wt 74.8 kg   LMP 06/08/1983   SpO2 97%   BMI 31.18 kg/m  Physical Exam Vitals and nursing note reviewed.  Constitutional:      General: She is not in acute distress.    Appearance: She is well-developed.  HENT:     Head: Normocephalic and atraumatic.     Mouth/Throat:     Mouth: Mucous membranes are dry.  Eyes:     Conjunctiva/sclera: Conjunctivae normal.  Cardiovascular:     Rate  and Rhythm: Normal rate and regular rhythm.     Pulses: Normal pulses.  Pulmonary:     Effort: Pulmonary effort is normal. No respiratory distress.     Breath sounds: Normal breath sounds.  Abdominal:     Palpations: Abdomen is soft.     Tenderness: There is no abdominal tenderness.  Musculoskeletal:        General: No swelling.     Cervical back: Neck supple.     Right lower leg: No edema.     Left lower leg: No edema.  Skin:    General: Skin is warm and dry.     Capillary Refill: Capillary refill takes less than 2 seconds.  Neurological:     General: No focal deficit present.     Mental Status: She is alert and oriented to person, place, and time. Mental status is at baseline.     Cranial Nerves: No cranial nerve deficit.     Sensory: No  sensory deficit.     Motor: No weakness.  Psychiatric:        Mood and Affect: Mood normal.     ED Results / Procedures / Treatments   Labs (all labs ordered are listed, but only abnormal results are displayed) Labs Reviewed  BASIC METABOLIC PANEL  CBC  URINALYSIS, ROUTINE W REFLEX MICROSCOPIC  CBG MONITORING, ED    EKG None  Radiology No results found.  Procedures Procedures    Medications Ordered in ED Medications - No data to display  ED Course/ Medical Decision Making/ A&P                           Medical Decision Making Amount and/or Complexity of Data Reviewed Labs: ordered. Radiology: ordered.  Risk Prescription drug management. Decision regarding hospitalization.    87 year old female presenting to the the emergency department after syncopal episode this morning.  She states that she had no prodromal symptoms and suddenly passed out.  She is unsure if she is dehydrated.  She denies any melena or hematochezia.  She denies any chest pain, shortness of breath or help her palpitations.  She denies any headaches, fevers or chills, denies any cough.  She denies any weakness, numbness, facial droop.  She denies any difficulty ambulating.  She denies any abdominal pain.  She is tolerating oral intake.  On arrival, the patient was afebrile, mildly tachycardic P114, RR 18, BP 133/57, O2 sats 97% on room air.  On exam, the patient appeared clinically dehydrated with dry mucous membranes.  She has a normal neurologic exam.  Her lungs were clear to auscultation bilaterally.  Her abdomen was nontender to palpation.  Differential diagnosis includes cardiogenic syncope, intracranial abnormality, less likely CVA, considered hypovolemia from dehydration.  Considered orthostatic hypotension or vasovagal syncope.  Initial EKG ordered, revealed normal sinus rhythm, ventricular rate 97, no acute ischemic changes noted, minimal voltage criteria for LVH present.  A chest x-ray and  CT head were ordered.  Initial laboratory evaluation performed in triage revealed a leukocytosis to 16.1 and elevated hemoglobin of 15.5 concerning for dehydration/hemoconcentration.  BMP revealed no significant electro abnormality evidence of an anion gap acidosis with a bicarbonate of 14, anion gap of 20, blood glucose 154.  The patient is not on any diabetic medications to suggest euglycemic DKA.  She has evidence of an AKI with serum creatinine of 1.35 and appears clinically dehydrated.  An IV fluid bolus was ordered.  Remainder of laboratory  work-up was pending at time of signout in addition to imaging.  Signout given to Dr. Tyrone Nine at 1530 to follow-up laboratory work-up, plan for likely admission.   Final Clinical Impression(s) / ED Diagnoses Final diagnoses:  None    Rx / DC Orders ED Discharge Orders     None         Regan Lemming, MD 03/23/22 2322

## 2022-03-23 NOTE — Hospital Course (Signed)
Lauren Hill is a 87 y.o. female with medical history significant for HTN, HLD, GERD, chronic leukocytosis, chronic back pain due to lumbar DDD who is admitted for syncope evaluation.

## 2022-03-23 NOTE — Assessment & Plan Note (Signed)
Noncontrast CT head showed a new subcortical hypodensity in the right temporoparietal lobe representing either a white matter infarction or mass lesion. -Obtain MRI brain without and with contrast for further characterization as recommended

## 2022-03-23 NOTE — ED Provider Notes (Signed)
I received the patient in signout from Dr. Dennis Bast, briefly the patient is a 87 year old female with a syncopal event without prodrome.  High risk syncope plan for admission to the hospital.  She also was noted to have a significant metabolic acidosis with anion gap.  Unknown etiology, believed to likely be due to dehydration.  Expanded the work-up to evaluate for possible causes.  Follow-up resulted and are unremarkable.  She was negative for ketones in her urine.  VBG without obvious acidosis.  Interestingly her beta hydroxybutyric acid is elevated.  Could be consistent with dehydration.  Not on any medicines that I would suspect would cause euglycemic DKA.  Will start on fluids.  Hospitalist admission.     Deno Etienne, DO 03/23/22 2204

## 2022-03-23 NOTE — Assessment & Plan Note (Signed)
Holding home olmesartan-HCTZ due to AKI.  Continue atenolol and amlodipine if orthostatics negative.

## 2022-03-23 NOTE — Assessment & Plan Note (Signed)
Appears to have chronic leukocytosis on available labs.  No obvious active infection. -CBC with differential and smear review in a.m.

## 2022-03-24 ENCOUNTER — Observation Stay (HOSPITAL_BASED_OUTPATIENT_CLINIC_OR_DEPARTMENT_OTHER): Payer: PPO

## 2022-03-24 ENCOUNTER — Encounter (HOSPITAL_COMMUNITY): Payer: Self-pay | Admitting: Internal Medicine

## 2022-03-24 ENCOUNTER — Observation Stay (HOSPITAL_COMMUNITY): Payer: PPO

## 2022-03-24 DIAGNOSIS — E785 Hyperlipidemia, unspecified: Secondary | ICD-10-CM

## 2022-03-24 DIAGNOSIS — K8020 Calculus of gallbladder without cholecystitis without obstruction: Secondary | ICD-10-CM | POA: Diagnosis not present

## 2022-03-24 DIAGNOSIS — N179 Acute kidney failure, unspecified: Secondary | ICD-10-CM

## 2022-03-24 DIAGNOSIS — R55 Syncope and collapse: Secondary | ICD-10-CM

## 2022-03-24 DIAGNOSIS — G936 Cerebral edema: Secondary | ICD-10-CM | POA: Diagnosis not present

## 2022-03-24 DIAGNOSIS — K5730 Diverticulosis of large intestine without perforation or abscess without bleeding: Secondary | ICD-10-CM | POA: Diagnosis not present

## 2022-03-24 DIAGNOSIS — N289 Disorder of kidney and ureter, unspecified: Secondary | ICD-10-CM | POA: Diagnosis not present

## 2022-03-24 DIAGNOSIS — J9811 Atelectasis: Secondary | ICD-10-CM | POA: Diagnosis not present

## 2022-03-24 DIAGNOSIS — M47814 Spondylosis without myelopathy or radiculopathy, thoracic region: Secondary | ICD-10-CM | POA: Diagnosis not present

## 2022-03-24 DIAGNOSIS — R739 Hyperglycemia, unspecified: Secondary | ICD-10-CM | POA: Diagnosis not present

## 2022-03-24 DIAGNOSIS — I10 Essential (primary) hypertension: Secondary | ICD-10-CM | POA: Diagnosis not present

## 2022-03-24 DIAGNOSIS — K449 Diaphragmatic hernia without obstruction or gangrene: Secondary | ICD-10-CM | POA: Diagnosis not present

## 2022-03-24 DIAGNOSIS — I700 Atherosclerosis of aorta: Secondary | ICD-10-CM | POA: Diagnosis not present

## 2022-03-24 LAB — ECHOCARDIOGRAM COMPLETE
Calc EF: 65.8 %
Height: 62 in
S' Lateral: 2.2 cm
Single Plane A2C EF: 69.4 %
Single Plane A4C EF: 65.7 %
Weight: 2610.25 oz

## 2022-03-24 LAB — CBC WITH DIFFERENTIAL/PLATELET
Abs Immature Granulocytes: 0.05 10*3/uL (ref 0.00–0.07)
Basophils Absolute: 0.1 10*3/uL (ref 0.0–0.1)
Basophils Relative: 0 %
Eosinophils Absolute: 0.1 10*3/uL (ref 0.0–0.5)
Eosinophils Relative: 0 %
HCT: 40.4 % (ref 36.0–46.0)
Hemoglobin: 12.8 g/dL (ref 12.0–15.0)
Immature Granulocytes: 0 %
Lymphocytes Relative: 19 %
Lymphs Abs: 2.7 10*3/uL (ref 0.7–4.0)
MCH: 31.2 pg (ref 26.0–34.0)
MCHC: 31.7 g/dL (ref 30.0–36.0)
MCV: 98.5 fL (ref 80.0–100.0)
Monocytes Absolute: 1.3 10*3/uL — ABNORMAL HIGH (ref 0.1–1.0)
Monocytes Relative: 9 %
Neutro Abs: 9.7 10*3/uL — ABNORMAL HIGH (ref 1.7–7.7)
Neutrophils Relative %: 72 %
Platelets: 267 10*3/uL (ref 150–400)
RBC: 4.1 MIL/uL (ref 3.87–5.11)
RDW: 12.5 % (ref 11.5–15.5)
WBC: 13.8 10*3/uL — ABNORMAL HIGH (ref 4.0–10.5)
nRBC: 0 % (ref 0.0–0.2)

## 2022-03-24 LAB — BASIC METABOLIC PANEL
Anion gap: 9 (ref 5–15)
BUN: 23 mg/dL (ref 8–23)
CO2: 23 mmol/L (ref 22–32)
Calcium: 9.6 mg/dL (ref 8.9–10.3)
Chloride: 107 mmol/L (ref 98–111)
Creatinine, Ser: 0.9 mg/dL (ref 0.44–1.00)
GFR, Estimated: >60 mL/min (ref >60)
Glucose, Bld: 101 mg/dL — ABNORMAL HIGH (ref 70–99)
Potassium: 3.6 mmol/L (ref 3.5–5.1)
Sodium: 139 mmol/L (ref 135–145)

## 2022-03-24 LAB — HEMOGLOBIN A1C
Hgb A1c MFr Bld: 5.9 % — ABNORMAL HIGH (ref 4.8–5.6)
Mean Plasma Glucose: 122.63 mg/dL

## 2022-03-24 LAB — TECHNOLOGIST SMEAR REVIEW

## 2022-03-24 MED ORDER — MELATONIN 3 MG PO TABS
3.0000 mg | ORAL_TABLET | Freq: Once | ORAL | Status: AC
  Administered 2022-03-24: 3 mg via ORAL
  Filled 2022-03-24: qty 1

## 2022-03-24 MED ORDER — ENOXAPARIN SODIUM 40 MG/0.4ML IJ SOSY
40.0000 mg | PREFILLED_SYRINGE | INTRAMUSCULAR | Status: DC
  Administered 2022-03-24 – 2022-03-25 (×2): 40 mg via SUBCUTANEOUS
  Filled 2022-03-24 (×2): qty 0.4

## 2022-03-24 MED ORDER — IOHEXOL 9 MG/ML PO SOLN
500.0000 mL | ORAL | Status: AC
  Administered 2022-03-24 (×2): 500 mL via ORAL

## 2022-03-24 MED ORDER — IOHEXOL 300 MG/ML  SOLN
100.0000 mL | Freq: Once | INTRAMUSCULAR | Status: AC | PRN
  Administered 2022-03-24: 100 mL via INTRAVENOUS

## 2022-03-24 MED ORDER — GADOBUTROL 1 MMOL/ML IV SOLN
7.0000 mL | Freq: Once | INTRAVENOUS | Status: AC | PRN
  Administered 2022-03-24: 7 mL via INTRAVENOUS

## 2022-03-24 MED ORDER — HYDRALAZINE HCL 25 MG PO TABS: 25.0000 mg | ORAL_TABLET | Freq: Three times a day (TID) | ORAL | Status: DC | PRN

## 2022-03-24 MED ORDER — IOHEXOL 9 MG/ML PO SOLN
ORAL | Status: AC
  Filled 2022-03-24: qty 1000

## 2022-03-24 NOTE — Progress Notes (Signed)
Orthostatic vitals on admission    03/23/22 2326 03/23/22 2328 03/23/22 2330  Vitals  BP (!) 166/70 (!) 156/74 (!) 157/66  MAP (mmHg) 97  --   --   BP Method Automatic Automatic  --   Patient Position (if appropriate) Orthostatic Vitals (lying) Orthostatic Vitals (sitting) Orthostatic Vitals (standing)  Pulse Rate (!) 108 (!) 114  --   Pulse Rate Source Monitor  --   --   ECG Heart Rate (!) 108 (!) 112 (!) 129  Resp  --  20 20

## 2022-03-24 NOTE — Progress Notes (Signed)
  Echocardiogram 2D Echocardiogram has been performed.  Eartha Inch 03/24/2022, 11:01 AM

## 2022-03-24 NOTE — TOC Initial Note (Signed)
Transition of Care [facility]) - Initial/Assessment Note    Patient Details  Name: Lauren Hill MRN: [id number] Date of Birth: 21-Oct-1934  Transition of Care [facility]) CM/SW Contact:    Leeroy Cha, RN Phone Number: 03/24/2022, 7:32 AM  Clinical Narrative:                 Transition of Care [facility]) Screening Note   Patient Details  Name: Lauren Hill Date of Birth: April 09, 1935   Transition of Care [facility]) CM/SW Contact:    Leeroy Cha, RN Phone Number: 03/24/2022, 7:33 AM    Transition of Care Department [facility]) has reviewed patient and no TOC needs have been identified at this time. We will continue to monitor patient advancement through interdisciplinary progression rounds. If new patient transition needs arise, please place a TOC consult.    Expected Discharge Plan: Assisted Living Barriers to Discharge: Continued Medical Work up   Patient Goals and CMS Choice Patient states their goals for this hospitalization and ongoing recovery are:: to go ack to my apartment at [facility]      Expected Discharge Plan and Services Expected Discharge Plan: Assisted Living   Discharge Planning Services: CM Consult   Living arrangements for the past 2 months: Apartment, [place]                                      Prior Living Arrangements/Services Living arrangements for the past 2 months: Apartment, [place] Lives with:: Facility Resident Patient language and need for interpreter reviewed:: Yes Do you feel safe going back to the place where you live?: Yes            Criminal Activity/Legal Involvement Pertinent to Current Situation/Hospitalization: No - Comment as needed  Activities of Daily Living Home Assistive Devices/Equipment: Cane (specify quad or straight) ADL Screening (condition at time of admission) Patient's cognitive ability adequate to safely complete daily activities?: Yes Is the patient deaf or  have difficulty hearing?: No Does the patient have difficulty seeing, even when wearing glasses/contacts?: No Does the patient have difficulty concentrating, remembering, or making decisions?: No Patient able to express need for assistance with ADLs?: No Does the patient have difficulty dressing or bathing?: No Independently performs ADLs?: Yes (appropriate for developmental age) Does the patient have difficulty walking or climbing stairs?: No Weakness of Legs: None Weakness of Arms/Hands: None  Permission Sought/Granted                  Emotional Assessment Appearance:: Appears stated age Attitude/Demeanor/Rapport: Engaged Affect (typically observed): Calm Orientation: : Oriented to Self, Oriented to Place, Oriented to  Time, Oriented to Situation Alcohol / Substance Use: Never Used Psych Involvement: No (comment)  Admission diagnosis:  Syncope [R55] AKI (acute kidney injury) ([place]) [N17.9] Patient Active Problem List   Diagnosis Date Noted   Syncope 03/23/2022   Abnormal CT of the head 03/23/2022   AKI (acute kidney injury) ([place]) 03/23/2022   Hyperglycemia 03/23/2022   Leukocytosis 03/23/2022   Essential hypertension 03/22/2014   Hyperlipidemia 03/22/2014   Microscopic hematuria 03/22/2014   PCP:  Burnard Bunting, MD Pharmacy:   [facility], [place] [address] Phone: [phone] Fax: [place] [facility] [address] [place] [address] Phone: [phone] Fax: [phone]  Social Determinants of Health (SDOH) Interventions Food Insecurity Interventions: Intervention Not Indicated Housing Interventions: Intervention Not Indicated  Readmission Risk Interventions   No data to display

## 2022-03-24 NOTE — Evaluation (Signed)
Occupational Therapy Evaluation Patient Details Name: Lauren Hill MRN: [id number] DOB: 09/04/1934 Today's Date: 03/24/2022   History of Present Illness Pt. 87 y/o female presented to ED 10/17 from home after a syncopal episode. medical history significant for HTN, HLD, GERD, chronic leukocytosis, chronic back pain due to lumbar DDD. 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 Prior to hospitalization patient lives alone and completed all ADLs/IADLs independently. Patient now presents near baseline. Patient supervision for supine to sit and sit to stand. Patient min guard for ambulation to/from bathroom. Patient min guard for toliet transfer and independent for tolieting. Patient supervision- independent for most ADLs requiring supervision for monitoring of possible syncopal symptoms.  During session patient stated not feeling dizzy or symptoms of syncope. BP was monitored during session. 141/70 in laying, 143/82  in sitting, 136/80 standing, an d141/62 after activity. Patient does have intermittent assistance from daughter if needed, but is exhibiting to be near baseline. Patient does not require OT needs at this time.      Recommendations for follow up therapy are one component of a multi-disciplinary discharge planning process, led by the attending physician.  Recommendations may be updated based on patient status, additional functional criteria and insurance authorization.   Follow Up Recommendations  No OT follow up    Assistance Recommended at Discharge PRN  Patient can return home with the following      Functional Status Assessment  Patient has not had a recent decline in their functional status  Equipment Recommendations  None recommended by OT    Recommendations for Other Services        Precautions / Restrictions Precautions Precautions: None Restrictions Weight Bearing Restrictions: No      Mobility Bed Mobility Overal bed mobility: Needs Assistance Bed Mobility: Supine to Sit     Supine to sit: Supervision          Transfers Overall transfer level: Needs assistance   Transfers: Sit to/from Stand Sit to Stand: Supervision           General transfer comment: pt. moves well, requires supervision for possible syncope symptoms      Balance Overall balance assessment: No apparent balance deficits (not formally assessed)                                         ADL either performed or assessed with clinical judgement   ADL Overall ADL's : Needs assistance/impaired Eating/Feeding: Independent   Grooming: Independent;Standing   Upper Body Bathing: Supervision/ safety;Standing   Lower Body Bathing: Supervison/ safety;Sit to/from stand   Upper Body Dressing : Independent   Lower Body Dressing: Sit to/from stand;Supervision/safety   Toilet Transfer: Economist and Hygiene: Independent;Sit to/from stand       Functional mobility during ADLs: Min guard       Vision Baseline Vision/History: 1 Wears glasses       Perception     Praxis      Pertinent Vitals/Pain Pain Assessment Pain Assessment: Faces Faces Pain Scale: No hurt     Hand Dominance Right   Extremity/Trunk Assessment Upper Extremity Assessment Upper Extremity Assessment: RUE deficits/detail;LUE deficits/detail RUE Deficits / Details: ROM-WFL LUE  Deficits / Details: ROM-WFL   Lower Extremity Assessment Lower Extremity Assessment: Defer to PT evaluation   Cervical / Trunk Assessment Cervical / Trunk Assessment: Normal   Communication Communication Communication: No difficulties   Cognition Arousal/Alertness: Awake/alert Behavior During Therapy: WFL for tasks assessed/performed Overall  Cognitive Status: Within Functional Limits for tasks assessed                                       General Comments       Exercises     Shoulder Instructions      Home Living Family/patient expects to be discharged to:: Private residence Living Arrangements: Alone Available Help at Discharge: Family;Available PRN/intermittently Type of Home: House Home Access: Stairs to enter [employer] of Steps: 3 Entrance Stairs-Rails: Can reach both Home Layout: One level     Bathroom Shower/Tub: Teacher, early years/pre: Standard     Home Equipment: Cane - quad;BSC/3in1          Prior Functioning/Environment Prior Level of Function : Independent/Modified Independent               ADLs Comments: patient independent for ADLs and drove in her community        OT Problem List:        OT Treatment/Interventions:      OT Goals(Current goals can be found in the care plan section) Acute Rehab OT Goals Patient Stated Goal: To go home OT Goal Formulation: With patient Time For Goal Achievement: 04/07/22 Potential to Achieve Goals: Good  OT Frequency:      Co-evaluation              AM-PAC OT "6 Clicks" Daily Activity     Outcome Measure Help from another person eating meals?: None Help from another person taking care of personal grooming?: None Help from another person toileting, which includes using toliet, bedpan, or urinal?: None Help from another person bathing (including washing, rinsing, drying)?: A Little Help from another person to put on and taking off regular upper body clothing?: None Help from another person to put on and taking off regular lower body clothing?: A Little 6 Click Score: 22   End of Session Equipment Utilized During Treatment: Gait belt Nurse Communication: Mobility status (patient moves well, no OT needs at this time)  Activity Tolerance: Patient tolerated treatment well Patient left: in  chair;with call bell/phone within reach;with chair alarm set  OT Visit Diagnosis: Muscle weakness (generalized) (M62.81)                Time: 2992-4268 OT Time Calculation (min): 21 min Charges:  OT General Charges $OT Visit: 1 Visit  Charlann Lange, OTS Acute rehab services   Charlann Lange 03/24/2022, 11:54 AM

## 2022-03-24 NOTE — Progress Notes (Signed)
[facility]                                                                               Lauren Hill, is a 87 y.o. female, DOB - [birth year], GXQ:[id number] Admit date - 03/23/2022    Outpatient Primary MD for the patient is Burnard Bunting, MD  LOS - 0  days    Brief summary   Lauren Hill is a 87 y.o. female with medical history significant for HTN, HLD, GERD, chronic leukocytosis, chronic back pain due to lumbar DDD who is admitted for syncope evaluation. 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 Neuro surgery consulted and requested transferring the patient to  [facility].  Patient and family at bedside and aware, agreeable to the plan.   Assessment & Plan    Assessment and Plan: * Syncope Presenting with syncope without warning.  Likely hypovolemic or vasovagal however cardiogenic also in differential.  Has history of first-degree AV block, CXR with cardiomegaly, and murmur noted on exam. Orthostatic vital signs are negative.  Echocardiogram ordered and pending.    4 cm necrotic mass in the brain:  Noncontrast CT head showed a new subcortical hypodensity in the right temporoparietal lobe representing either a white matter infarction or mass lesion. 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 - neurosurgery consulted and requested transfer the patient to [facility], meanwhile obtain CT chest, abdomen and pelvis to check for primary malignancy.   AKI (acute kidney injury) ([place]) Creatinine 1.35 on admit, no recent prior for baseline comparison.  Likely hypovolemic. Started on IV fluids, with improvement in the creatinine to 0.90  Leukocytosis Appears to have chronic leukocytosis on available labs.  No obvious active infection. Leukocytosis improved.    Hyperglycemia Mild hyperglycemia without DKA/HHS.  Beta hydroxybutyrate slightly elevated, likely due to dehydration.   A1c is 5.9%    Hyperlipidemia Continue atorvastatin.  Essential hypertension BP parameters are sub optimal.  Holding home olmesartan-HCTZ due to AKI.  Continue atenolol and amlodipine if orthostatics negative. Added hydralazine 25 mg TID PRN      Estimated body mass index is 29.84 kg/m as calculated from the following:   Height as of this encounter: '5\' 2"'$  (1.575 m).   Weight as of this encounter: 74 kg.  Code Status: full code.  DVT Prophylaxis:  enoxaparin (LOVENOX) injection 40 mg Start: 03/24/22 2200   Level of Care: Level of care: Telemetry Medical Family Communication: daughter at bedside.   Disposition Plan:     Remains inpatient appropriate:  further work up of the necrotic mass in the brain.   Procedures:  Mri BRAIN.  ECHO CT  chest, abd and pelvis  Consultants:   Neuro surgery.    Antimicrobials:   Anti-infectives (From admission, onward)    None        Medications  Scheduled Meds:  amLODipine  5 mg Oral BID   atenolol  50 mg Oral QHS   atorvastatin  10 mg Oral q1800   enoxaparin (  LOVENOX) injection  40 mg Subcutaneous Q24H   iohexol  500 mL Oral Q1H   sodium chloride flush  3 mL Intravenous Q12H   Continuous Infusions: PRN Meds:.acetaminophen **OR** acetaminophen, famotidine, hydrALAZINE, ondansetron **OR** ondansetron (ZOFRAN) IV, senna-docusate    Subjective:   Lauren Hill was seen and examined today.  HEADACHE after the MRI, which has improved.  No nausea, vomiting or abdominal pain.   Objective:   Vitals:   03/23/22 2330 03/24/22 0443 03/24/22 0500 03/24/22 0839  BP: (!) 157/66 (!) 157/72  (!) 141/70  Pulse:  94  89  Resp: '20 17  16  '$ Temp:  98.5 F (36.9 C)  98.4 F (36.9 C)  TempSrc:  Oral  Oral  SpO2:  94%  96%  Weight:   74 kg   Height:        Intake/Output Summary (Last 24 hours) at  03/24/2022 1119 Last data filed at 03/23/2022 2200 Gross per 24 hour  Intake 2291.09 ml  Output --  Net 2291.09 ml   Filed Weights   03/23/22 1237 03/23/22 1538 03/24/22 0500  Weight: 74.8 kg 74.8 kg 74 kg     Exam General: Alert and oriented x 3, NAD Cardiovascular: S1 S2 auscultated, no murmurs, RRR Respiratory: Clear to auscultation bilaterally, no wheezing, rales or rhonchi Gastrointestinal: Soft, nontender, nondistended, + bowel sounds Ext: no pedal edema bilaterally Neuro: AAOx3, Cr N's II- XII. Strength 5/5 upper and lower extremities bilaterally Skin: No rashes Psych: Normal affect and demeanor, alert and oriented x3    Data Reviewed:  I have personally reviewed following labs and imaging studies   CBC Lab Results  Component Value Date   WBC 13.8 (H) 03/24/2022   RBC 4.10 03/24/2022   HGB 12.8 03/24/2022   HCT 40.4 03/24/2022   MCV 98.5 03/24/2022   MCH 31.2 03/24/2022   PLT 267 03/24/2022   MCHC 31.7 03/24/2022   RDW 12.5 03/24/2022   LYMPHSABS 2.7 03/24/2022   MONOABS 1.3 (H) 03/24/2022   EOSABS 0.1 03/24/2022   BASOSABS 0.1 44/96/7591     Last metabolic panel Lab Results  Component Value Date   NA 139 03/24/2022   K 3.6 03/24/2022   CL 107 03/24/2022   CO2 23 03/24/2022   BUN 23 03/24/2022   CREATININE 0.90 03/24/2022   GLUCOSE 101 (H) 03/24/2022   GFRNONAA >60 03/24/2022   GFRAA >60 11/11/2017   CALCIUM 9.6 03/24/2022   PHOS 4.2 11/11/2017   PROT 6.3 (L) 11/11/2017   ALBUMIN 3.3 (L) 11/11/2017   BILITOT 0.8 11/11/2017   ALKPHOS 73 11/11/2017   AST 19 11/11/2017   ALT 18 11/11/2017   ANIONGAP 9 03/24/2022    CBG (last 3)  No results for input(s): "GLUCAP" in the last 72 hours.    Coagulation Profile: No results for input(s): "INR", "PROTIME" in the last 168 hours.   Radiology Studies: MR BRAIN W WO CONTRAST  Result Date: 03/24/2022 CLINICAL DATA:  Passed out.  Abnormal head CT. EXAM: MRI HEAD WITHOUT AND WITH CONTRAST  TECHNIQUE: Multiplanar, multiecho pulse sequences of the brain and surrounding structures were obtained without and with intravenous contrast. CONTRAST:  26m GADAVIST GADOBUTROL 1 MMOL/ML IV SOLN COMPARISON:  Head CT from yesterday and 08/07/2018 FINDINGS: Brain: Mass in the posterior right temporal lobe which is peripherally enhancing at areas showing dense cellular features. Central non enhancement with necrotic appearance. The mass appears intra-axial and measures up to 4 cm. Regional T2 hyperintensity and swelling at least partially  from vasogenic edema. The mass reaches the atrium of the right lateral ventricle without evidence of ependymal spread. No second mass is seen. No abnormality seen in this area on 2020 head CT. No acute infarct, hydrocephalus, or collection. Vascular: Major flow voids and vascular enhancements are preserved Skull and upper cervical spine: No aggressive bone lesion. Sinuses/Orbits: Bilateral cataract resection. Partial left mastoid opacification. 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 Electronically Signed   By: Jorje Guild M.D.   On: 03/24/2022 07:27   CT Head Wo Contrast  Result Date: 03/23/2022 CLINICAL DATA:  Single episode EXAM: CT HEAD WITHOUT CONTRAST TECHNIQUE: Contiguous axial images were obtained from the base of the skull through the vertex without intravenous contrast. RADIATION DOSE REDUCTION: This exam was performed according to the departmental dose-optimization program which includes automated exposure control, adjustment of the mA and/or kV according to patient size and/or use of iterative reconstruction technique. COMPARISON:  Fifty 08/08/2018 FINDINGS: Brain: There is new subcortical hypodensity in the RIGHT parietal temporal lobe (image 13/series 2) which has a rounded shape measuring 1.5 cm. There is a vasogenic edema pattern extending into the temporal operculum (image  9/2. No intracranial hemorrhage. No extra-axial fluid collections. No midline shift or mass effect. Vascular: No hyperdense vessel or unexpected calcification. Skull: Normal. Negative for fracture or focal lesion. Sinuses/Orbits: No acute finding. Other: None. IMPRESSION: 1. New subcortical hypodensity in the RIGHT temporoparietal lobe representing either a white matter infarction or mass lesion. Recommend MRI brain without and with contrast for further characterization. 2. No midline shift or mass effect.  No intraparenchymal hemorrhage. Electronically Signed   By: Suzy Bouchard M.D.   On: 03/23/2022 17:36   DG Chest Portable 1 View  Result Date: 03/23/2022 CLINICAL DATA:  Syncope EXAM: PORTABLE CHEST 1 VIEW COMPARISON:  Chest radiograph November 11, 2017. FINDINGS: Enlarged cardiac silhouette. Aortic atherosclerosis. No focal airspace consolidation or overt pulmonary edema. Left shoulder arthroplasty. Severe degenerative change of the right shoulder. Thoracic spondylosis. IMPRESSION: Enlarged cardiac silhouette without overt pulmonary edema or focal airspace consolidation. Electronically Signed   By: Dahlia Bailiff M.D.   On: 03/23/2022 13:31       Hosie Poisson M.D. [facility] 03/24/2022, 11:19 AM  Available via Epic secure chat 7am-7pm After 7 pm, please refer to night coverage provider listed on amion.

## 2022-03-24 NOTE — Progress Notes (Addendum)
PT Cancellation Note  Patient Details Name: IVANIA TEAGARDEN MRN: [id number] DOB: 1934/11/17   Cancelled Treatment:    Reason Eval/Treat Not Completed: Other (comment) Pt was just told she has a brain tumor.  Will f/u at later time.  11:26 Spoke with pt later.  Plan is for her to transfer to [facility].  She requested to hold PT eval.  Abran Richard, PT Acute Rehab [facility] [place] 03/24/2022, 10:31 AM

## 2022-03-25 ENCOUNTER — Other Ambulatory Visit: Payer: Self-pay | Admitting: Radiation Therapy

## 2022-03-25 ENCOUNTER — Observation Stay (HOSPITAL_COMMUNITY): Payer: PPO

## 2022-03-25 DIAGNOSIS — R55 Syncope and collapse: Secondary | ICD-10-CM | POA: Diagnosis not present

## 2022-03-25 DIAGNOSIS — N2889 Other specified disorders of kidney and ureter: Secondary | ICD-10-CM | POA: Diagnosis not present

## 2022-03-25 DIAGNOSIS — E785 Hyperlipidemia, unspecified: Secondary | ICD-10-CM | POA: Diagnosis not present

## 2022-03-25 DIAGNOSIS — R930 Abnormal findings on diagnostic imaging of skull and head, not elsewhere classified: Secondary | ICD-10-CM | POA: Diagnosis not present

## 2022-03-25 DIAGNOSIS — R739 Hyperglycemia, unspecified: Secondary | ICD-10-CM | POA: Diagnosis not present

## 2022-03-25 DIAGNOSIS — C719 Malignant neoplasm of brain, unspecified: Secondary | ICD-10-CM | POA: Diagnosis not present

## 2022-03-25 DIAGNOSIS — I10 Essential (primary) hypertension: Secondary | ICD-10-CM | POA: Diagnosis not present

## 2022-03-25 DIAGNOSIS — N179 Acute kidney failure, unspecified: Secondary | ICD-10-CM | POA: Diagnosis not present

## 2022-03-25 LAB — GLUCOSE, CAPILLARY: Glucose-Capillary: 114 mg/dL — ABNORMAL HIGH (ref 70–99)

## 2022-03-25 MED ORDER — MELATONIN 3 MG PO TABS
3.0000 mg | ORAL_TABLET | Freq: Once | ORAL | Status: AC
  Administered 2022-03-25: 3 mg via ORAL
  Filled 2022-03-25: qty 1

## 2022-03-25 NOTE — Consult Note (Signed)
Reason for Consult:  Right brain tumor Referring Physician:  [name] is an 87 y.o. female.  HPI:  the patient is an 87 year old right handed healthy white female nonsmoker  who I have seen in the past secondary to back pain, lumbar stenosis and lumbar spondylolisthesis.  She elected medical management and has been getting injections.    On 03/23/2022 the patient had a syncopal event.  There was no witnessed seizures.  The patient was brought to [facility] and workup included a head scan and brain MRI which demonstrated a right posterior temporal tumor.  The patient was subsequently transferred for to [facility] for further care and neurosurgical evaluation.    Presently the patient is alert and pleasant.  She is accompanied by her son and daughter and Lall.  She denies headaches, nausea, vomiting, seizures, etcetera.  She says she feels great.  Past Medical History:  Diagnosis Date   Arthritis    GERD (gastroesophageal reflux disease)    indigestion   History of blood transfusion    as an infant   History of hiatal hernia    History of pneumonia    Hyperlipidemia    Hypertension    Renal cyst     Past Surgical History:  Procedure Laterality Date   COLONOSCOPY     DILATION AND CURETTAGE OF UTERUS     x2   EYE SURGERY     cataracts   HIP SURGERY  5/05   right hip replacement   HIP SURGERY  5/11   left hip replacement   ORIF HUMERUS FRACTURE Left 11/11/2017   Procedure: OPEN REDUCTION INTERNAL FIXATION (ORIF) LEFT DISTAL HUMERUS FRACTURE;  Surgeon: Shona Needles, MD;  Location: [facility];  Service: Orthopedics;  Laterality: Left;   RHINOPLASTY     TONSILLECTOMY     TOTAL SHOULDER ARTHROPLASTY Left 04/01/2016   Procedure: LEFT TOTAL SHOULDER ARTHROPLASTY;  Surgeon: Justice Britain, MD;  Location: [place];  Service: Orthopedics;  Laterality: Left;    Family History  Problem Relation Age of Onset   Heart disease Mother     Social History:   reports that she has never smoked. She has never used smokeless tobacco. She reports that she does not drink alcohol and does not use drugs.  Allergies: No Known Allergies  Medications: I have reviewed the patient's current medications. Prior to Admission:  Medications Prior to Admission  Medication Sig Dispense Refill Last Dose   acetaminophen (TYLENOL) 650 MG CR tablet Take 1,300 mg by mouth in the morning, at noon, and at bedtime.   03/23/2022   amLODipine (NORVASC) 5 MG tablet Take 5 mg by mouth 2 (two) times daily.    03/23/2022   atenolol (TENORMIN) 50 MG tablet Take 50 mg by mouth at bedtime.   03/22/2022 at 2000   atorvastatin (LIPITOR) 10 MG tablet Take 10 mg by mouth daily at 6 PM.    03/22/2022   cholecalciferol (VITAMIN D3) 25 MCG (1000 UNIT) tablet Take 1,000 Units by mouth daily.   03/23/2022   famotidine (PEPCID) 10 MG tablet Take 10 mg by mouth at bedtime as needed for heartburn or indigestion.   unknown   magnesium gluconate (MAGONATE) 500 MG tablet Take 500 mg by mouth at bedtime.   03/22/2022   multivitamin-lutein (OCUVITE-LUTEIN) CAPS capsule Take 1 capsule by mouth in the morning and at bedtime.   03/23/2022   olmesartan-hydrochlorothiazide (BENICAR HCT) 40-25 MG tablet Take 1 tablet by  mouth daily.   03/23/2022   Potassium 99 MG TABS Take 99 mg by mouth at bedtime.   03/22/2022   acyclovir ointment (ZOVIRAX) 5 % Apply 1 Application topically every 3 (three) hours. (Patient not taking: Reported on 03/23/2022) 15 g 0 Completed Course   valACYclovir (VALTREX) 1000 MG tablet Take 1 tablet (1,000 mg total) by mouth 2 (two) times daily. (Patient not taking: Reported on 03/23/2022) 20 tablet 0 Completed Course   Scheduled:  amLODipine  5 mg Oral BID   atenolol  50 mg Oral QHS   atorvastatin  10 mg Oral q1800   enoxaparin (LOVENOX) injection  40 mg Subcutaneous Q24H   sodium chloride flush  3 mL Intravenous Q12H   Continuous: ZOX:[id number] **OR** acetaminophen,  famotidine, hydrALAZINE, ondansetron **OR** ondansetron (ZOFRAN) IV, senna-docusate Anti-infectives (From admission, onward)    None        Results for orders placed or performed during the hospital encounter of 03/23/22 (from the past 48 hour(s))  Hemoglobin A1c     Status: Abnormal   Collection Time: 03/24/22  5:17 AM  Result Value Ref Range   Hgb A1c MFr Bld 5.9 (H) 4.8 - 5.6 %    Comment: (NOTE) Pre diabetes:          5.7%-6.4%  Diabetes:              >6.4%  Glycemic control for   <7.0% adults with diabetes    Mean Plasma Glucose 122.63 mg/dL    Comment: Performed at [facility], [address], [place], [place] [address]  Basic metabolic panel     Status: Abnormal   Collection Time: 03/24/22  5:17 AM  Result Value Ref Range   Sodium 139 135 - 145 mmol/L   Potassium 3.6 3.5 - 5.1 mmol/L   Chloride 107 98 - 111 mmol/L   CO2 23 22 - 32 mmol/L   Glucose, Bld 101 (H) 70 - 99 mg/dL    Comment: Glucose reference range applies only to samples taken after fasting for at least 8 hours.   BUN 23 8 - 23 mg/dL   Creatinine, Ser 0.90 0.44 - 1.00 mg/dL   Calcium 9.6 8.9 - 10.3 mg/dL   GFR, Estimated >60 >60 mL/min    Comment: (NOTE) Calculated using the CKD-EPI Creatinine Equation (2021)    Anion gap 9 5 - 15    Comment: Performed at [facility], [place] [address] [place], [place] [address]  CBC with Differential/Platelet     Status: Abnormal   Collection Time: 03/24/22  5:17 AM  Result Value Ref Range   WBC 13.8 (H) 4.0 - 10.5 K/uL   RBC 4.10 3.87 - 5.11 MIL/uL   Hemoglobin 12.8 12.0 - 15.0 g/dL   HCT 40.4 36.0 - 46.0 %   MCV 98.5 80.0 - 100.0 fL   MCH 31.2 26.0 - 34.0 pg   MCHC 31.7 30.0 - 36.0 g/dL   RDW 12.5 11.5 - 15.5 %   Platelets 267 150 - 400 K/uL   nRBC 0.0 0.0 - 0.2 %   Neutrophils Relative % 72 %   Neutro Abs 9.7 (H) 1.7 - 7.7 K/uL   Lymphocytes Relative 19 %   Lymphs Abs 2.7 0.7 - 4.0 K/uL   Monocytes Relative 9 %   Monocytes  Absolute 1.3 (H) 0.1 - 1.0 K/uL   Eosinophils Relative 0 %   Eosinophils Absolute 0.1 0.0 - 0.5 K/uL   Basophils Relative 0 %  Basophils Absolute 0.1 0.0 - 0.1 K/uL   Immature Granulocytes 0 %   Abs Immature Granulocytes 0.05 0.00 - 0.07 K/uL    Comment: Performed at [facility], [place] [address] [place], [place] [address]  Technologist smear review     Status: None   Collection Time: 03/24/22  5:17 AM  Result Value Ref Range   WBC MORPHOLOGY MORPHOLOGY UNREMARKABLE    RBC MORPHOLOGY POLYCHROMASIA PRESENT    Plt Morphology MORPHOLOGY UNREMARKABLE    Clinical Information Chronic leukocytosis     Comment: Performed at [facility], [place] [address], [place], [place] [address]  Glucose, capillary     Status: Abnormal   Collection Time: 03/25/22  5:22 AM  Result Value Ref Range   Glucose-Capillary 114 (H) 70 - 99 mg/dL    Comment: Glucose reference range applies only to samples taken after fasting for at least 8 hours.    US RENAL  Result Date: 03/25/2022 CLINICAL DATA:  Normal CT scan. Fullness of the left renal collecting system and ureter. EXAM: RENAL / URINARY TRACT ULTRASOUND COMPLETE COMPARISON:  CT of the abdomen and pelvis 03/24/2022 FINDINGS: Right Kidney: Renal measurements: 8.8 x 3.7 x 3.5 cm = volume: 60.6 mL. Echogenicity within normal limits. No mass or hydronephrosis visualized. Parenchymal thinning noted. Left Kidney: Renal measurements: 11.1 x 6.2 x 4.7 cm = volume: 171.0 ML. No hydronephrosis is present. Prominent central sinus fat again noted. Parenchyma is within normal limits. Bladder: Appears normal for degree of bladder distention. Details are not well seen due to lack of distension. IMPRESSION: 1. No hydronephrosis. 2. Right kidney is smaller than the left with parenchymal thinning. 3. Left kidney is normal in appearance. 4. Details of the bladder are not well seen due to lack of distension. Electronically Signed   By: San Morelle M.D.   On: 03/25/2022 14:31   ECHOCARDIOGRAM COMPLETE  Result Date: 03/24/2022    ECHOCARDIOGRAM REPORT   Patient Name:   Lauren Hill Date of Exam: 03/24/2022 Medical Rec #:  [id number]          Height:       62.0 in Accession #:    [id number]         Weight:       163.1 lb Date of Birth:  Jul 01, 1934         BSA:          1.753 m Patient Age:    72 years           BP:           141/70 mmHg Patient Gender: F                  HR:           97 bpm. Exam Location:  Inpatient Procedure: 2D Echo, Cardiac Doppler and Color Doppler Indications:    Syncope  History:        Patient has no prior history of Echocardiogram examinations.                 Signs/Symptoms:Syncope; Risk Factors:Hypertension and                 Dyslipidemia. Leukocytosis.  Sonographer:    Eartha Inch Referring Phys: [id number] VISHAL R PATEL  Sonographer Comments: Technically difficult study due to poor echo windows. Image acquisition challenging due to patient body habitus and Image acquisition challenging due to respiratory motion. IMPRESSIONS  1. Left ventricular  ejection fraction, by estimation, is 65 to 70%. The left ventricle has normal function. The left ventricle has no regional wall motion abnormalities. Left ventricular diastolic parameters are indeterminate.  2. Right ventricular systolic function is normal. The right ventricular size is normal. There is mildly elevated pulmonary artery systolic pressure.  3. Left atrial size was moderately dilated.  4. Right atrial size was mildly dilated.  5. The mitral valve is normal in structure. Trivial mitral valve regurgitation. No evidence of mitral stenosis.  6. The aortic valve is tricuspid. There is mild calcification of the aortic valve. Aortic valve regurgitation is trivial. Aortic valve sclerosis/calcification is present, without any evidence of aortic stenosis.  7. The inferior vena cava is normal in size with greater than 50% respiratory variability, suggesting right  atrial pressure of 3 mmHg. FINDINGS  Left Ventricle: Left ventricular ejection fraction, by estimation, is 65 to 70%. The left ventricle has normal function. The left ventricle has no regional wall motion abnormalities. The left ventricular internal cavity size was normal in size. There is  no left ventricular hypertrophy. Left ventricular diastolic parameters are indeterminate. Right Ventricle: The right ventricular size is normal. No increase in right ventricular wall thickness. Right ventricular systolic function is normal. There is mildly elevated pulmonary artery systolic pressure. The tricuspid regurgitant velocity is 2.84  m/s, and with an assumed right atrial pressure of 8 mmHg, the estimated right ventricular systolic pressure is 61.9 mmHg. Left Atrium: Left atrial size was moderately dilated. Right Atrium: Right atrial size was mildly dilated. Pericardium: There is no evidence of pericardial effusion. Mitral Valve: The mitral valve is normal in structure. Mild mitral annular calcification. Trivial mitral valve regurgitation. No evidence of mitral valve stenosis. Tricuspid Valve: The tricuspid valve is normal in structure. Tricuspid valve regurgitation is mild . No evidence of tricuspid stenosis. Aortic Valve: The aortic valve is tricuspid. There is mild calcification of the aortic valve. Aortic valve regurgitation is trivial. Aortic valve sclerosis/calcification is present, without any evidence of aortic stenosis. Pulmonic Valve: The pulmonic valve was normal in structure. Pulmonic valve regurgitation is trivial. No evidence of pulmonic stenosis. Aorta: The aortic root is normal in size and structure. Venous: The inferior vena cava is normal in size with greater than 50% respiratory variability, suggesting right atrial pressure of 3 mmHg. IAS/Shunts: No atrial level shunt detected by color flow Doppler.  LEFT VENTRICLE PLAX 2D LVIDd:         3.60 cm     Diastology LVIDs:         2.20 cm     LV e' medial:   12.10 cm/s LV PW:         0.90 cm     LV e' lateral: 6.96 cm/s LV IVS:        0.90 cm LVOT diam:     1.80 cm LV SV:         69 LV SV Index:   39 LVOT Area:     2.54 cm  LV Volumes (MOD) LV vol d, MOD A2C: 60.5 ml LV vol d, MOD A4C: 65.9 ml LV vol s, MOD A2C: 18.5 ml LV vol s, MOD A4C: 22.6 ml LV SV MOD A2C:     42.0 ml LV SV MOD A4C:     65.9 ml LV SV MOD BP:      41.5 ml RIGHT VENTRICLE             IVC RV S prime:  13.30 cm/s  IVC diam: 1.80 cm TAPSE (M-mode): 2.1 cm LEFT ATRIUM             Index        RIGHT ATRIUM           Index LA diam:        4.00 cm 2.28 cm/m   RA Area:     13.30 cm LA Vol (A2C):   58.0 ml 33.08 ml/m  RA Volume:   31.50 ml  17.97 ml/m LA Vol (A4C):   75.8 ml 43.24 ml/m LA Biplane Vol: 67.2 ml 38.33 ml/m  AORTIC VALVE LVOT Vmax:   123.00 cm/s LVOT Vmean:  86.100 cm/s LVOT VTI:    0.272 m  AORTA Ao Root diam: 3.00 cm Ao Asc diam:  2.80 cm TRICUSPID VALVE TR Peak grad:   32.3 mmHg TR Mean grad:   23.0 mmHg TR Vmax:        284.00 cm/s TR Vmean:       229.0 cm/s  SHUNTS Systemic VTI:  0.27 m Systemic Diam: 1.80 cm Glori Bickers MD Electronically signed by Glori Bickers MD Signature Date/Time: 03/24/2022/4:57:10 PM    Final    CT CHEST ABDOMEN PELVIS W CONTRAST  Result Date: 03/24/2022 CLINICAL DATA:  Suspected metastatic disease the brain versus is CNS primary, unknown primary neoplasm. * Tracking Code: BO * EXAM: CT CHEST, ABDOMEN, AND PELVIS WITH CONTRAST TECHNIQUE: Multidetector CT imaging of the chest, abdomen and pelvis was performed following the standard protocol during bolus administration of intravenous contrast. RADIATION DOSE REDUCTION: This exam was performed according to the departmental dose-optimization program which includes automated exposure control, adjustment of the mA and/or kV according to patient size and/or use of iterative reconstruction technique. CONTRAST:  167m OMNIPAQUE IOHEXOL 300 MG/ML  SOLN COMPARISON:  None Available. FINDINGS: CT CHEST  FINDINGS Cardiovascular: Heart size moderately enlarged. No pericardial effusion. Calcified aortic atherosclerotic changes. No aortic dilation. Normal caliber central pulmonary vasculature. Mediastinum/Nodes: No thoracic inlet, axillary, mediastinal or hilar adenopathy. Esophagus grossly normal. Lungs/Pleura: Mild basilar atelectasis. Airways are patent. No pleural effusion or dense consolidative changes. No suspicious pulmonary mass or nodule. Musculoskeletal: See below for full musculoskeletal details. No chest wall mass. CT ABDOMEN PELVIS FINDINGS Hepatobiliary: No focal, suspicious hepatic lesion. No pericholecystic stranding. Mild gallbladder distension. No biliary duct dilation. Cholelithiasis. Small portal to hepatic venous shunt in the LEFT hepatic lobe (image 56/2) 6 mm at the site of portal to hepatic venous shunting. Hepatic veins are patent. Portal vein is patent. Pancreas: Mild atrophy of the pancreas without ductal dilation, inflammation or visible lesion. Spleen: Normal. Adrenals/Urinary Tract: Adrenal glands are normal. 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 No suspicious renal lesion. Mild fullness of LEFT ureter and renal pelvis. No discrete site of ureteral transition. No perinephric stranding. LEFT UVJ in urinary bladder cannot be evaluated further due to streak artifact from bilateral hip arthroplasty changes. Stomach/Bowel: Small hiatal hernia. No acute gastrointestinal findings. Mild colonic diverticulosis of the sigmoid colon. Vascular/Lymphatic: Aortic atherosclerosis. No sign of aneurysm. Smooth contour of the IVC. There is no gastrohepatic or hepatoduodenal ligament lymphadenopathy. No retroperitoneal or mesenteric lymphadenopathy. No pelvic sidewall lymphadenopathy. Reproductive: Not well assessed given streak artifact from bilateral hip arthroplasty changes. Other: No ascites. Musculoskeletal: Post bilateral hip  arthroplasty changes, incompletely evaluated. Spinal degenerative changes. Degenerative changes most pronounced in the lower thoracic and in the lumbar spine with levo convexity  of the lumbar spine related to degenerative changes. No acute or destructive bone process. Signs of LEFT shoulder arthroplasty as well. IMPRESSION: 1. No evidence of primary or metastatic disease in the chest, abdomen or pelvis. 2. Mild fullness of LEFT ureter and renal pelvis. No discrete site of ureteral transition. LEFT UVJ in urinary bladder cannot be evaluated further due to streak artifact from bilateral hip arthroplasty changes. And consider short correlate with urinalysis interval follow-up with renal sonogram to exclude developing or worsening of collecting system and ureteral dilation in this patient with a very small RIGHT kidney that likely shows limited function relative to the LEFT kidney. Note that the urinary bladder and LEFT UVJ cannot be assessed for signs of obstructing lesion in this location but the ureter is nondilated below the level of the mid LEFT ureter. 3. Cholelithiasis without evidence of acute cholecystitis. 4. 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 5. Mild colonic diverticulosis of the sigmoid colon. 6. Small hiatal hernia. 7. Aortic atherosclerosis. 8. 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 Aortic Atherosclerosis (ICD10-I70.0). Electronically Signed   By: Zetta Bills M.D.   On: 03/24/2022 15:12   MR BRAIN W WO CONTRAST  Result Date: 03/24/2022 CLINICAL DATA:  Passed out.  Abnormal head CT. EXAM: MRI HEAD WITHOUT AND WITH CONTRAST TECHNIQUE: Multiplanar, multiecho pulse sequences of the brain and surrounding structures were obtained without and with intravenous contrast. CONTRAST:  9m GADAVIST GADOBUTROL 1 MMOL/ML IV SOLN COMPARISON:  Head CT from yesterday and  08/07/2018 FINDINGS: Brain: Mass in the posterior right temporal lobe which is peripherally enhancing at areas showing dense cellular features. Central non enhancement with necrotic appearance. The mass appears intra-axial and measures up to 4 cm. Regional T2 hyperintensity and swelling at least partially from vasogenic edema. The mass reaches the atrium of the right lateral ventricle without evidence of ependymal spread. No second mass is seen. No abnormality seen in this area on 2020 head CT. No acute infarct, hydrocephalus, or collection. Vascular: Major flow voids and vascular enhancements are preserved Skull and upper cervical spine: No aggressive bone lesion. Sinuses/Orbits: Bilateral cataract resection. Partial left mastoid opacification. 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 Electronically Signed   By: JJorje GuildM.D.   On: 03/24/2022 07:27    ROS: As above and also chronic back pain Blood pressure 125/74, pulse 97, temperature 99.1 F (37.3 C), temperature source Oral, resp. rate 17, height '5\' 2"'$  (1.575 m), weight 76.7 kg, last menstrual period 06/08/1983, SpO2 95 %. Estimated body mass index is 30.93 kg/m as calculated from the following:   Height as of this encounter: '5\' 2"'$  (1.575 m).   Weight as of this encounter: 76.7 kg.  Physical Exam  General: An alert and pleasant healthy-appearing 87year old white female in no apparent distress  HEENT: Normocephalic, atraumatic, extraocular muscles are intact  Neck:  Age-appropriate decreased cervical range of motion.  Spurling's testing is negative.  Lhermitte sign was not present   Thorax:  Symmetric   Abdomen:  Soft   Extremities: Unremarkable  Neurologic exam:  The patient is alert and oriented x3.  Cranial nerves 2-12 were examined bilaterally in grossly normal.  Her motor strength is normal in her bilateral biceps, triceps, hand grip,  quadriceps, gastrocnemius and dorsiflexors.  Cerebellar function  is intact to rapid alternating movements of the upper extremities bilaterally.  Sensory functions intact to light touch sensation in all tested dermatomes bilaterally.  I reviewed the patient's head CT and brain her MRI.  She has a right posterior temporal irregularly enhancing mass consistent with a primary brain tumor.  The patient's CT of the chest abdomen pelvis did not demonstrate any other signs ofcancer.  Assessment/Plan:  Right brain lesion:  I have discussed situation with the patient and her family.  I have told her she has a  brain tumor.  We discussed the various types took tumors including metastatic tumors, primary tumors, etcetera.  I have told them that this appears to be a primary brain tumor.  We discussed the fact that the most common primary brain tumor and adults is a glioblastoma.  They have done some research on this.  We have discussed the various treatment options.  I have told her she basically has 4 choices.  1:  Do nothing realizing that this is likely a malignant incurable tumor.  2:  Assume this is a glioblastoma and empirically treat her with chemotherapy and radiation therapy.  3:  Stereotactic brain biopsy to confirm the presumed diagnosis and subsequent chemotherapy and radiation therapy.  4:  Craniotomy to resect as much of this tumor as possible and likely have postoperative radiation therapy chemotherapy. I described a craniotomy to them.  We have discussed the risks of surgery including risks of anesthesia, hemorrhage, stroke, infection, seizures, incomplete tumor resection, tumor regrowth, vision loss, medical risks, etcetera.  We discussed the pros and cons of each 1 of these options.  We discussed the expected life expectancy with glioblastomas with or without treatment.  I have answered all their questions.  I have asked her to think things over and decide what she wants to do.  If she  decides to proceed with surgery it would be okay to discharge her because I could not do the surgery until at the earliest next week and there is no urgency to doing her surgery as her tumor may well be an incidental finding.  We can arrange  her surgery via office.    Lauren Hill 03/25/2022, 8:41 PM

## 2022-03-25 NOTE — Plan of Care (Signed)
  Problem: Health Behavior/Discharge Planning: Goal: Ability to manage health-related needs will improve Outcome: Progressing   

## 2022-03-25 NOTE — Progress Notes (Signed)
[facility]                                                                               Lauren Hill, is a 87 y.o. female, DOB - [birth year], PXT:[id number] Admit date - 03/23/2022    Outpatient Primary MD for the patient is Burnard Bunting, MD  LOS - 0  days    Brief summary   Lauren Hill is a 87 y.o. female with medical history significant for HTN, HLD, GERD, chronic leukocytosis, chronic back pain due to lumbar DDD who is admitted for syncope evaluation. 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 Neuro surgery consulted and requested transferring the patient to  [facility].  Patient and family at bedside and aware, agreeable to the plan.   Assessment & Plan    Assessment and Plan: * Syncope Presenting with syncope without warning.  Likely hypovolemic or vasovagal however cardiogenic also in differential.  Has history of first-degree AV block, CXR with cardiomegaly, and murmur noted on exam. Orthostatic vital signs are negative.  Echocardiogram ordered, showed LV ef of 65 TO 70%, no regional wall motion abnormalities. LV diastolic parameters are indeterminate.  Telemetry does not show any any arrhythmias.    4 cm necrotic mass in the brain:  Noncontrast CT head showed a new subcortical hypodensity in the right temporoparietal lobe representing either a white matter infarction or mass lesion. 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 - neurosurgery consulted and requested transfer the patient to [facility], meanwhile obtain CT chest, abdomen and pelvis to check for primary malignancy.  CT chest , abdomen and pelvis does not show any primary malignant areas.   AKI (acute kidney injury) ([place]) Creatinine 1.35 on admit, no recent prior for baseline comparison.   Likely hypovolemic. Started on IV fluids, with improvement in the creatinine to 0.90  Leukocytosis Appears to have chronic leukocytosis on available labs.  No obvious active infection. Leukocytosis improved.  Continue to monitor.   Hyperglycemia Mild hyperglycemia without DKA/HHS.  Beta hydroxybutyrate slightly elevated, likely due to dehydration.   A1c is 5.9%   Hyperlipidemia Continue atorvastatin.  Essential hypertension Optimal BP parameters .  Holding home olmesartan-HCTZ due to AKI.  Continue atenolol and amlodipine if orthostatics negative. Added hydralazine 25 mg TID PRN    Mild fullness of LEFT ureter and renal pelvis - get US renal for further evaluation.      Estimated body mass index is 30.93 kg/m as calculated from the following:   Height as of this encounter: '5\' 2"'$  (1.575 m).   Weight as of this encounter: 76.7 kg.  Code Status: full code.  DVT Prophylaxis:  enoxaparin (LOVENOX) injection 40 mg Start: 03/24/22 2200   Level of Care: Level of care: Med-Surg Family Communication: daughter at bedside.   Disposition Plan:     Remains inpatient appropriate:  neuro surgery consult pending.   Procedures:  Mri BRAIN.  ECHO CT  chest, abd and pelvis  Consultants:   Neuro surgery.  Antimicrobials:   Anti-infectives (From admission, onward)    None        Medications  Scheduled Meds:  amLODipine  5 mg Oral BID   atenolol  50 mg Oral QHS   atorvastatin  10 mg Oral q1800   enoxaparin (LOVENOX) injection  40 mg Subcutaneous Q24H   sodium chloride flush  3 mL Intravenous Q12H   Continuous Infusions: PRN Meds:.acetaminophen **OR** acetaminophen, famotidine, hydrALAZINE, ondansetron **OR** ondansetron (ZOFRAN) IV, senna-docusate    Subjective:   Lauren Hill was seen and examined today. No new complaints.  Objective:   Vitals:   03/24/22 2010 03/24/22 2200 03/25/22 0500 03/25/22 0609  BP: (!) 147/67   (!) 135/58  Pulse: 100   85   Resp: '18 17  17  '$ Temp: 98.5 F (36.9 C)   98.2 F (36.8 C)  TempSrc: Oral   Oral  SpO2: 97%   95%  Weight:   76.7 kg   Height:        Intake/Output Summary (Last 24 hours) at 03/25/2022 1034 Last data filed at 03/24/2022 1915 Gross per 24 hour  Intake 360 ml  Output --  Net 360 ml    Filed Weights   03/23/22 1538 03/24/22 0500 03/25/22 0500  Weight: 74.8 kg 74 kg 76.7 kg     Exam General exam: Appears calm and comfortable  Respiratory system: Clear to auscultation. Respiratory effort normal. Cardiovascular system: S1 & S2 heard, RRR. No JVD,  No pedal edema. Gastrointestinal system: Abdomen is nondistended, soft and nontender. Normal bowel sounds heard. Central nervous system: Alert and oriented. No focal neurological deficits. Extremities: Symmetric 5 x 5 power. Skin: No rashes, lesions or ulcers Psychiatry:  Mood & affect appropriate.    Data Reviewed:  I have personally reviewed following labs and imaging studies   CBC Lab Results  Component Value Date   WBC 13.8 (H) 03/24/2022   RBC 4.10 03/24/2022   HGB 12.8 03/24/2022   HCT 40.4 03/24/2022   MCV 98.5 03/24/2022   MCH 31.2 03/24/2022   PLT 267 03/24/2022   MCHC 31.7 03/24/2022   RDW 12.5 03/24/2022   LYMPHSABS 2.7 03/24/2022   MONOABS 1.3 (H) 03/24/2022   EOSABS 0.1 03/24/2022   BASOSABS 0.1 10/62/6948     Last metabolic panel Lab Results  Component Value Date   NA 139 03/24/2022   K 3.6 03/24/2022   CL 107 03/24/2022   CO2 23 03/24/2022   BUN 23 03/24/2022   CREATININE 0.90 03/24/2022   GLUCOSE 101 (H) 03/24/2022   GFRNONAA >60 03/24/2022   GFRAA >60 11/11/2017   CALCIUM 9.6 03/24/2022   PHOS 4.2 11/11/2017   PROT 6.3 (L) 11/11/2017   ALBUMIN 3.3 (L) 11/11/2017   BILITOT 0.8 11/11/2017   ALKPHOS 73 11/11/2017   AST 19 11/11/2017   ALT 18 11/11/2017   ANIONGAP 9 03/24/2022    CBG (last 3)  Recent Labs    03/25/22 0522  GLUCAP 114*      Coagulation Profile: No results for  input(s): "INR", "PROTIME" in the last 168 hours.   Radiology Studies: ECHOCARDIOGRAM COMPLETE  Result Date: 03/24/2022    ECHOCARDIOGRAM REPORT   Patient Name:   Lauren Hill Date of Exam: 03/24/2022 Medical Rec #:  [id number]          Height:       62.0 in Accession #:    [id number]         Weight:  163.1 lb Date of Birth:  [birth year]         BSA:          1.753 m Patient Age:    86 years           BP:           141/70 mmHg Patient Gender: F                  HR:           97 bpm. Exam Location:  Inpatient Procedure: 2D Echo, Cardiac Doppler and Color Doppler Indications:    Syncope  History:        Patient has no prior history of Echocardiogram examinations.                 Signs/Symptoms:Syncope; Risk Factors:Hypertension and                 Dyslipidemia. Leukocytosis.  Sonographer:    Eartha Inch Referring Phys: [id number] VISHAL R PATEL  Sonographer Comments: Technically difficult study due to poor echo windows. Image acquisition challenging due to patient body habitus and Image acquisition challenging due to respiratory motion. IMPRESSIONS  1. Left ventricular ejection fraction, by estimation, is 65 to 70%. The left ventricle has normal function. The left ventricle has no regional wall motion abnormalities. Left ventricular diastolic parameters are indeterminate.  2. Right ventricular systolic function is normal. The right ventricular size is normal. There is mildly elevated pulmonary artery systolic pressure.  3. Left atrial size was moderately dilated.  4. Right atrial size was mildly dilated.  5. The mitral valve is normal in structure. Trivial mitral valve regurgitation. No evidence of mitral stenosis.  6. The aortic valve is tricuspid. There is mild calcification of the aortic valve. Aortic valve regurgitation is trivial. Aortic valve sclerosis/calcification is present, without any evidence of aortic stenosis.  7. The inferior vena cava is normal in size with greater than 50%  respiratory variability, suggesting right atrial pressure of 3 mmHg. FINDINGS  Left Ventricle: Left ventricular ejection fraction, by estimation, is 65 to 70%. The left ventricle has normal function. The left ventricle has no regional wall motion abnormalities. The left ventricular internal cavity size was normal in size. There is  no left ventricular hypertrophy. Left ventricular diastolic parameters are indeterminate. Right Ventricle: The right ventricular size is normal. No increase in right ventricular wall thickness. Right ventricular systolic function is normal. There is mildly elevated pulmonary artery systolic pressure. The tricuspid regurgitant velocity is 2.84  m/s, and with an assumed right atrial pressure of 8 mmHg, the estimated right ventricular systolic pressure is 50.9 mmHg. Left Atrium: Left atrial size was moderately dilated. Right Atrium: Right atrial size was mildly dilated. Pericardium: There is no evidence of pericardial effusion. Mitral Valve: The mitral valve is normal in structure. Mild mitral annular calcification. Trivial mitral valve regurgitation. No evidence of mitral valve stenosis. Tricuspid Valve: The tricuspid valve is normal in structure. Tricuspid valve regurgitation is mild . No evidence of tricuspid stenosis. Aortic Valve: The aortic valve is tricuspid. There is mild calcification of the aortic valve. Aortic valve regurgitation is trivial. Aortic valve sclerosis/calcification is present, without any evidence of aortic stenosis. Pulmonic Valve: The pulmonic valve was normal in structure. Pulmonic valve regurgitation is trivial. No evidence of pulmonic stenosis. Aorta: The aortic root is normal in size and structure. Venous: The inferior vena cava is normal in size with greater than 50% respiratory variability, suggesting right atrial  pressure of 3 mmHg. IAS/Shunts: No atrial level shunt detected by color flow Doppler.  LEFT VENTRICLE PLAX 2D LVIDd:         3.60 cm     Diastology  LVIDs:         2.20 cm     LV e' medial:  12.10 cm/s LV PW:         0.90 cm     LV e' lateral: 6.96 cm/s LV IVS:        0.90 cm LVOT diam:     1.80 cm LV SV:         69 LV SV Index:   39 LVOT Area:     2.54 cm  LV Volumes (MOD) LV vol d, MOD A2C: 60.5 ml LV vol d, MOD A4C: 65.9 ml LV vol s, MOD A2C: 18.5 ml LV vol s, MOD A4C: 22.6 ml LV SV MOD A2C:     42.0 ml LV SV MOD A4C:     65.9 ml LV SV MOD BP:      41.5 ml RIGHT VENTRICLE             IVC RV S prime:     13.30 cm/s  IVC diam: 1.80 cm TAPSE (M-mode): 2.1 cm LEFT ATRIUM             Index        RIGHT ATRIUM           Index LA diam:        4.00 cm 2.28 cm/m   RA Area:     13.30 cm LA Vol (A2C):   58.0 ml 33.08 ml/m  RA Volume:   31.50 ml  17.97 ml/m LA Vol (A4C):   75.8 ml 43.24 ml/m LA Biplane Vol: 67.2 ml 38.33 ml/m  AORTIC VALVE LVOT Vmax:   123.00 cm/s LVOT Vmean:  86.100 cm/s LVOT VTI:    0.272 m  AORTA Ao Root diam: 3.00 cm Ao Asc diam:  2.80 cm TRICUSPID VALVE TR Peak grad:   32.3 mmHg TR Mean grad:   23.0 mmHg TR Vmax:        284.00 cm/s TR Vmean:       229.0 cm/s  SHUNTS Systemic VTI:  0.27 m Systemic Diam: 1.80 cm Glori Bickers MD Electronically signed by Glori Bickers MD Signature Date/Time: 03/24/2022/4:57:10 PM    Final    CT CHEST ABDOMEN PELVIS W CONTRAST  Result Date: 03/24/2022 CLINICAL DATA:  Suspected metastatic disease the brain versus is CNS primary, unknown primary neoplasm. * Tracking Code: BO * EXAM: CT CHEST, ABDOMEN, AND PELVIS WITH CONTRAST TECHNIQUE: Multidetector CT imaging of the chest, abdomen and pelvis was performed following the standard protocol during bolus administration of intravenous contrast. RADIATION DOSE REDUCTION: This exam was performed according to the departmental dose-optimization program which includes automated exposure control, adjustment of the mA and/or kV according to patient size and/or use of iterative reconstruction technique. CONTRAST:  167m OMNIPAQUE IOHEXOL 300 MG/ML  SOLN COMPARISON:   None Available. FINDINGS: CT CHEST FINDINGS Cardiovascular: Heart size moderately enlarged. No pericardial effusion. Calcified aortic atherosclerotic changes. No aortic dilation. Normal caliber central pulmonary vasculature. Mediastinum/Nodes: No thoracic inlet, axillary, mediastinal or hilar adenopathy. Esophagus grossly normal. Lungs/Pleura: Mild basilar atelectasis. Airways are patent. No pleural effusion or dense consolidative changes. No suspicious pulmonary mass or nodule. Musculoskeletal: See below for full musculoskeletal details. No chest wall mass. CT ABDOMEN PELVIS FINDINGS Hepatobiliary: No focal, suspicious hepatic lesion. No pericholecystic  stranding. Mild gallbladder distension. No biliary duct dilation. Cholelithiasis. Small portal to hepatic venous shunt in the LEFT hepatic lobe (image 56/2) 6 mm at the site of portal to hepatic venous shunting. Hepatic veins are patent. Portal vein is patent. Pancreas: Mild atrophy of the pancreas without ductal dilation, inflammation or visible lesion. Spleen: Normal. Adrenals/Urinary Tract: Adrenal glands are normal. 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 No suspicious renal lesion. Mild fullness of LEFT ureter and renal pelvis. No discrete site of ureteral transition. No perinephric stranding. LEFT UVJ in urinary bladder cannot be evaluated further due to streak artifact from bilateral hip arthroplasty changes. Stomach/Bowel: Small hiatal hernia. No acute gastrointestinal findings. Mild colonic diverticulosis of the sigmoid colon. Vascular/Lymphatic: Aortic atherosclerosis. No sign of aneurysm. Smooth contour of the IVC. There is no gastrohepatic or hepatoduodenal ligament lymphadenopathy. No retroperitoneal or mesenteric lymphadenopathy. No pelvic sidewall lymphadenopathy. Reproductive: Not well assessed given streak artifact from bilateral hip arthroplasty changes. Other: No ascites.  Musculoskeletal: Post bilateral hip arthroplasty changes, incompletely evaluated. Spinal degenerative changes. Degenerative changes most pronounced in the lower thoracic and in the lumbar spine with levo convexity of the lumbar spine related to degenerative changes. No acute or destructive bone process. Signs of LEFT shoulder arthroplasty as well. IMPRESSION: 1. No evidence of primary or metastatic disease in the chest, abdomen or pelvis. 2. Mild fullness of LEFT ureter and renal pelvis. No discrete site of ureteral transition. LEFT UVJ in urinary bladder cannot be evaluated further due to streak artifact from bilateral hip arthroplasty changes. And consider short correlate with urinalysis interval follow-up with renal sonogram to exclude developing or worsening of collecting system and ureteral dilation in this patient with a very small RIGHT kidney that likely shows limited function relative to the LEFT kidney. Note that the urinary bladder and LEFT UVJ cannot be assessed for signs of obstructing lesion in this location but the ureter is nondilated below the level of the mid LEFT ureter. 3. Cholelithiasis without evidence of acute cholecystitis. 4. 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 5. Mild colonic diverticulosis of the sigmoid colon. 6. Small hiatal hernia. 7. Aortic atherosclerosis. 8. 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 Aortic Atherosclerosis (ICD10-I70.0). Electronically Signed   By: Zetta Bills M.D.   On: 03/24/2022 15:12   MR BRAIN W WO CONTRAST  Result Date: 03/24/2022 CLINICAL DATA:  Passed out.  Abnormal head CT. EXAM: MRI HEAD WITHOUT AND WITH CONTRAST TECHNIQUE: Multiplanar, multiecho pulse sequences of the brain and surrounding structures were obtained without and with intravenous contrast. CONTRAST:  52m GADAVIST GADOBUTROL 1 MMOL/ML IV SOLN  COMPARISON:  Head CT from yesterday and 08/07/2018 FINDINGS: Brain: Mass in the posterior right temporal lobe which is peripherally enhancing at areas showing dense cellular features. Central non enhancement with necrotic appearance. The mass appears intra-axial and measures up to 4 cm. Regional T2 hyperintensity and swelling at least partially from vasogenic edema. The mass reaches the atrium of the right lateral ventricle without evidence of ependymal spread. No second mass is seen. No abnormality seen in this area on 2020 head CT. No acute infarct, hydrocephalus, or collection. Vascular: Major flow voids and vascular enhancements are preserved Skull and upper cervical spine: No aggressive bone lesion. Sinuses/Orbits: Bilateral cataract resection. Partial left mastoid opacification.  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 Electronically Signed   By: Jorje Guild M.D.   On: 03/24/2022 07:27   CT Head Wo Contrast  Result Date: 03/23/2022 CLINICAL DATA:  Single episode EXAM: CT HEAD WITHOUT CONTRAST TECHNIQUE: Contiguous axial images were obtained from the base of the skull through the vertex without intravenous contrast. RADIATION DOSE REDUCTION: This exam was performed according to the departmental dose-optimization program which includes automated exposure control, adjustment of the mA and/or kV according to patient size and/or use of iterative reconstruction technique. COMPARISON:  Fifty 08/08/2018 FINDINGS: Brain: There is new subcortical hypodensity in the RIGHT parietal temporal lobe (image 13/series 2) which has a rounded shape measuring 1.5 cm. There is a vasogenic edema pattern extending into the temporal operculum (image 9/2. No intracranial hemorrhage. No extra-axial fluid collections. No midline shift or mass effect. Vascular: No hyperdense vessel or unexpected calcification. Skull: Normal. Negative for  fracture or focal lesion. Sinuses/Orbits: No acute finding. Other: None. IMPRESSION: 1. New subcortical hypodensity in the RIGHT temporoparietal lobe representing either a white matter infarction or mass lesion. Recommend MRI brain without and with contrast for further characterization. 2. No midline shift or mass effect.  No intraparenchymal hemorrhage. Electronically Signed   By: Suzy Bouchard M.D.   On: 03/23/2022 17:36   DG Chest Portable 1 View  Result Date: 03/23/2022 CLINICAL DATA:  Syncope EXAM: PORTABLE CHEST 1 VIEW COMPARISON:  Chest radiograph November 11, 2017. FINDINGS: Enlarged cardiac silhouette. Aortic atherosclerosis. No focal airspace consolidation or overt pulmonary edema. Left shoulder arthroplasty. Severe degenerative change of the right shoulder. Thoracic spondylosis. IMPRESSION: Enlarged cardiac silhouette without overt pulmonary edema or focal airspace consolidation. Electronically Signed   By: Dahlia Bailiff M.D.   On: 03/23/2022 13:31       Hosie Poisson M.D. [facility] 03/25/2022, 10:34 AM  Available via Epic secure chat 7am-7pm After 7 pm, please refer to night coverage provider listed on amion.

## 2022-03-25 NOTE — Progress Notes (Signed)
Patient arrived to floor from [facility] via [facility]. VSS, Call bell within reach. Patient alert and oriented x4.

## 2022-03-25 NOTE — Progress Notes (Signed)
Received report from ongoing nurse. Patient alert and oriented x 4. No complaints of pain. No concerns at this time. Agree with previous assessment. Will continue to monitor patient.

## 2022-03-25 NOTE — Progress Notes (Signed)
PT Cancellation Note  Patient Details Name: Lauren Hill MRN: [id number] DOB: 11-Mar-1935   Cancelled Treatment:    Reason Eval/Treat Not Completed: PT screened, no needs identified, will sign off (pt reports she's been walking in the hallway with a walker without difficulty. She denies loss of balance, weakness, sensation changes. She stated she doesn't need PT at present, will sign off. Pt was encouraged to continue walking in the halls.)   [name] PT 03/25/2022  [place]  Office [phone]

## 2022-03-25 NOTE — Consult Note (Signed)
[facility] Consult Note  Patient Care Team: Burnard Bunting, MD as PCP - General (Internal Medicine)  CHIEF COMPLAINTS/PURPOSE OF CONSULTATION:  Loss of consciousness R temporal mass  HISTORY OF PRESENTING ILLNESS:  Lauren Hill 86 y.o. female presented with episode of LOC while at church.  She was "out for short time" then returned to normal shortly thereafter.  CNS imaging demonstrates large enhancing right temporal mass.  At this time she feels at her baseline, no complaints.  Has been ambulating in hallways.  MEDICAL HISTORY:  Past Medical History:  Diagnosis Date   Arthritis    GERD (gastroesophageal reflux disease)    indigestion   History of blood transfusion    as an infant   History of hiatal hernia    History of pneumonia    Hyperlipidemia    Hypertension    Renal cyst     SURGICAL HISTORY: Past Surgical History:  Procedure Laterality Date   COLONOSCOPY     DILATION AND CURETTAGE OF UTERUS     x2   EYE SURGERY     cataracts   HIP SURGERY  5/05   right hip replacement   HIP SURGERY  5/11   left hip replacement   ORIF HUMERUS FRACTURE Left 11/11/2017   Procedure: OPEN REDUCTION INTERNAL FIXATION (ORIF) LEFT DISTAL HUMERUS FRACTURE;  Surgeon: Shona Needles, MD;  Location: [place];  Service: Orthopedics;  Laterality: Left;   RHINOPLASTY     TONSILLECTOMY     TOTAL SHOULDER ARTHROPLASTY Left 04/01/2016   Procedure: LEFT TOTAL SHOULDER ARTHROPLASTY;  Surgeon: Justice Britain, MD;  Location: [place];  Service: Orthopedics;  Laterality: Left;    SOCIAL HISTORY: Social History   Socioeconomic History   Marital status: Widowed    Spouse name: Not on file   Number of children: Not on file   Years of education: Not on file   Highest education level: Not on file  Occupational History   Not on file  Tobacco Use   Smoking status: Never   Smokeless tobacco: Never  Vaping Use   Vaping Use: Never used  Substance and Sexual Activity    Alcohol use: No   Drug use: No   Sexual activity: Not Currently  Other Topics Concern   Not on file  Social History Narrative   Not on file   Social Determinants of Health   Financial Resource Strain: Not on file  Food Insecurity: No Food Insecurity (03/23/2022)   Hunger Vital Sign    Worried About Running Out of Food in the Last Year: Never true    Ran Out of Food in the Last Year: Never true  Transportation Needs: No Transportation Needs (03/24/2022)   PRAPARE - Hydrologist (Medical): No    Lack of Transportation (Non-Medical): No  Physical Activity: Not on file  Stress: Not on file  Social Connections: Not on file  Intimate Partner Violence: Not At Risk (03/24/2022)   Humiliation, Afraid, Rape, and Kick questionnaire    Fear of Current or Ex-Partner: No    Emotionally Abused: No    Physically Abused: No    Sexually Abused: No    FAMILY HISTORY: Family History  Problem Relation Age of Onset   Heart disease Mother     ALLERGIES:  has No Known Allergies.  MEDICATIONS:  Current Facility-Administered Medications  Medication Dose Route Frequency Provider Last Rate Last Admin   acetaminophen (TYLENOL) tablet 650 mg  650  mg Oral Q6H PRN Hosie Poisson, MD   650 mg at 03/24/22 0740   Or   acetaminophen (TYLENOL) suppository 650 mg  650 mg Rectal Q6H PRN Hosie Poisson, MD       amLODipine (NORVASC) tablet 5 mg  5 mg Oral BID Hosie Poisson, MD   5 mg at 03/25/22 1000   atenolol (TENORMIN) tablet 50 mg  50 mg Oral QHS Hosie Poisson, MD   50 mg at 03/24/22 2017   atorvastatin (LIPITOR) tablet 10 mg  10 mg Oral q1800 Hosie Poisson, MD   10 mg at 03/24/22 1719   enoxaparin (LOVENOX) injection 40 mg  40 mg Subcutaneous Q24H Hosie Poisson, MD   40 mg at 03/24/22 2017   famotidine (PEPCID) tablet 10 mg  10 mg Oral QHS PRN Hosie Poisson, MD       hydrALAZINE (APRESOLINE) tablet 25 mg  25 mg Oral Q8H PRN Hosie Poisson, MD       ondansetron (ZOFRAN)  tablet 4 mg  4 mg Oral Q6H PRN Hosie Poisson, MD       Or   ondansetron (ZOFRAN) injection 4 mg  4 mg Intravenous Q6H PRN Hosie Poisson, MD       senna-docusate (Senokot-S) tablet 1 tablet  1 tablet Oral QHS PRN Hosie Poisson, MD       sodium chloride flush (NS) 0.9 % injection 3 mL  3 mL Intravenous Q12H Hosie Poisson, MD   3 mL at 03/25/22 1001    REVIEW OF SYSTEMS:   Constitutional: Denies fevers, chills or abnormal weight loss Eyes: Denies blurriness of vision Ears, nose, mouth, throat, and face: Denies mucositis or sore throat Respiratory: Denies cough, dyspnea or wheezes Cardiovascular: Denies palpitation, chest discomfort or lower extremity swelling Gastrointestinal:  Denies nausea, constipation, diarrhea GU: Denies dysuria or incontinence Skin: Denies abnormal skin rashes Neurological: Per HPI Musculoskeletal: Denies joint pain, back or neck discomfort. No decrease in ROM Behavioral/Psych: Denies anxiety, disturbance in thought content, and mood instability   PHYSICAL EXAMINATION: Vitals:   03/25/22 0609 03/25/22 1308  BP: (!) 135/58 (!) 144/62  Pulse: 85 88  Resp: 17 18  Temp: 98.2 F (36.8 C) 98.2 F (36.8 C)  SpO2: 95% 97%   KPS: 80. General: Alert, cooperative, pleasant, in no acute distress Head: Normal EENT: No conjunctival injection or scleral icterus. Oral mucosa moist Lungs: Resp effort normal Cardiac: Regular rate and rhythm Abdomen: Soft, non-distended abdomen Skin: No rashes cyanosis or petechiae. Extremities: No clubbing or edema  NEUROLOGIC EXAM: Mental Status: Awake, alert, attentive to examiner. Oriented to self and environment. Language is fluent with intact comprehension.  Cranial Nerves: Visual acuity is grossly normal. Visual fields are full. Extra-ocular movements intact. No ptosis. Face is symmetric, tongue midline. Motor: Tone and bulk are normal. Power is full in both arms and legs. Reflexes are symmetric, no pathologic reflexes present.  Intact finger to nose bilaterally Sensory: Intact to light touch and temperature Gait: Normal and tandem gait is normal.   LABORATORY DATA:  I have reviewed the data as listed Lab Results  Component Value Date   WBC 13.8 (H) 03/24/2022   HGB 12.8 03/24/2022   HCT 40.4 03/24/2022   MCV 98.5 03/24/2022   PLT 267 03/24/2022   Recent Labs    03/23/22 1243 03/23/22 1632 03/24/22 0517  NA 135 136 139  K 3.9 3.6 3.6  CL 101  --  107  CO2 14*  --  23  GLUCOSE 154*  --  101*  BUN 34*  --  23  CREATININE 1.35*  --  0.90  CALCIUM 10.3  --  9.6  GFRNONAA 39*  --  >60    RADIOGRAPHIC STUDIES: I have personally reviewed the radiological images as listed and agreed with the findings in the report. US RENAL  Result Date: 03/25/2022 CLINICAL DATA:  Normal CT scan. Fullness of the left renal collecting system and ureter. EXAM: RENAL / URINARY TRACT ULTRASOUND COMPLETE COMPARISON:  CT of the abdomen and pelvis 03/24/2022 FINDINGS: Right Kidney: Renal measurements: 8.8 x 3.7 x 3.5 cm = volume: 60.6 mL. Echogenicity within normal limits. No mass or hydronephrosis visualized. Parenchymal thinning noted. Left Kidney: Renal measurements: 11.1 x 6.2 x 4.7 cm = volume: 171.0 ML. No hydronephrosis is present. Prominent central sinus fat again noted. Parenchyma is within normal limits. Bladder: Appears normal for degree of bladder distention. Details are not well seen due to lack of distension. IMPRESSION: 1. No hydronephrosis. 2. Right kidney is smaller than the left with parenchymal thinning. 3. Left kidney is normal in appearance. 4. Details of the bladder are not well seen due to lack of distension. Electronically Signed   By: San Morelle M.D.   On: 03/25/2022 14:31   ECHOCARDIOGRAM COMPLETE  Result Date: 03/24/2022    ECHOCARDIOGRAM REPORT   Patient Name:   MERLYN BOLLEN Date of Exam: 03/24/2022 Medical Rec #:  [id number]          Height:       62.0 in Accession #:    [id number]          Weight:       163.1 lb Date of Birth:  Dec 17, 1934         BSA:          1.753 m Patient Age:    34 years           BP:           141/70 mmHg Patient Gender: F                  HR:           97 bpm. Exam Location:  Inpatient Procedure: 2D Echo, Cardiac Doppler and Color Doppler Indications:    Syncope  History:        Patient has no prior history of Echocardiogram examinations.                 Signs/Symptoms:Syncope; Risk Factors:Hypertension and                 Dyslipidemia. Leukocytosis.  Sonographer:    Eartha Inch Referring Phys: [id number] VISHAL R PATEL  Sonographer Comments: Technically difficult study due to poor echo windows. Image acquisition challenging due to patient body habitus and Image acquisition challenging due to respiratory motion. IMPRESSIONS  1. Left ventricular ejection fraction, by estimation, is 65 to 70%. The left ventricle has normal function. The left ventricle has no regional wall motion abnormalities. Left ventricular diastolic parameters are indeterminate.  2. Right ventricular systolic function is normal. The right ventricular size is normal. There is mildly elevated pulmonary artery systolic pressure.  3. Left atrial size was moderately dilated.  4. Right atrial size was mildly dilated.  5. The mitral valve is normal in structure. Trivial mitral valve regurgitation. No evidence of mitral stenosis.  6. The aortic valve is tricuspid. There is mild calcification of the aortic valve. Aortic valve regurgitation is trivial. Aortic valve sclerosis/calcification is present,  without any evidence of aortic stenosis.  7. The inferior vena cava is normal in size with greater than 50% respiratory variability, suggesting right atrial pressure of 3 mmHg. FINDINGS  Left Ventricle: Left ventricular ejection fraction, by estimation, is 65 to 70%. The left ventricle has normal function. The left ventricle has no regional wall motion abnormalities. The left ventricular internal cavity size was normal  in size. There is  no left ventricular hypertrophy. Left ventricular diastolic parameters are indeterminate. Right Ventricle: The right ventricular size is normal. No increase in right ventricular wall thickness. Right ventricular systolic function is normal. There is mildly elevated pulmonary artery systolic pressure. The tricuspid regurgitant velocity is 2.84  m/s, and with an assumed right atrial pressure of 8 mmHg, the estimated right ventricular systolic pressure is 43.3 mmHg. Left Atrium: Left atrial size was moderately dilated. Right Atrium: Right atrial size was mildly dilated. Pericardium: There is no evidence of pericardial effusion. Mitral Valve: The mitral valve is normal in structure. Mild mitral annular calcification. Trivial mitral valve regurgitation. No evidence of mitral valve stenosis. Tricuspid Valve: The tricuspid valve is normal in structure. Tricuspid valve regurgitation is mild . No evidence of tricuspid stenosis. Aortic Valve: The aortic valve is tricuspid. There is mild calcification of the aortic valve. Aortic valve regurgitation is trivial. Aortic valve sclerosis/calcification is present, without any evidence of aortic stenosis. Pulmonic Valve: The pulmonic valve was normal in structure. Pulmonic valve regurgitation is trivial. No evidence of pulmonic stenosis. Aorta: The aortic root is normal in size and structure. Venous: The inferior vena cava is normal in size with greater than 50% respiratory variability, suggesting right atrial pressure of 3 mmHg. IAS/Shunts: No atrial level shunt detected by color flow Doppler.  LEFT VENTRICLE PLAX 2D LVIDd:         3.60 cm     Diastology LVIDs:         2.20 cm     LV e' medial:  12.10 cm/s LV PW:         0.90 cm     LV e' lateral: 6.96 cm/s LV IVS:        0.90 cm LVOT diam:     1.80 cm LV SV:         69 LV SV Index:   39 LVOT Area:     2.54 cm  LV Volumes (MOD) LV vol d, MOD A2C: 60.5 ml LV vol d, MOD A4C: 65.9 ml LV vol s, MOD A2C: 18.5 ml LV  vol s, MOD A4C: 22.6 ml LV SV MOD A2C:     42.0 ml LV SV MOD A4C:     65.9 ml LV SV MOD BP:      41.5 ml RIGHT VENTRICLE             IVC RV S prime:     13.30 cm/s  IVC diam: 1.80 cm TAPSE (M-mode): 2.1 cm LEFT ATRIUM             Index        RIGHT ATRIUM           Index LA diam:        4.00 cm 2.28 cm/m   RA Area:     13.30 cm LA Vol (A2C):   58.0 ml 33.08 ml/m  RA Volume:   31.50 ml  17.97 ml/m LA Vol (A4C):   75.8 ml 43.24 ml/m LA Biplane Vol: 67.2 ml 38.33 ml/m  AORTIC VALVE LVOT Vmax:  123.00 cm/s LVOT Vmean:  86.100 cm/s LVOT VTI:    0.272 m  AORTA Ao Root diam: 3.00 cm Ao Asc diam:  2.80 cm TRICUSPID VALVE TR Peak grad:   32.3 mmHg TR Mean grad:   23.0 mmHg TR Vmax:        284.00 cm/s TR Vmean:       229.0 cm/s  SHUNTS Systemic VTI:  0.27 m Systemic Diam: 1.80 cm Glori Bickers MD Electronically signed by Glori Bickers MD Signature Date/Time: 03/24/2022/4:57:10 PM    Final    CT CHEST ABDOMEN PELVIS W CONTRAST  Result Date: 03/24/2022 CLINICAL DATA:  Suspected metastatic disease the brain versus is CNS primary, unknown primary neoplasm. * Tracking Code: BO * EXAM: CT CHEST, ABDOMEN, AND PELVIS WITH CONTRAST TECHNIQUE: Multidetector CT imaging of the chest, abdomen and pelvis was performed following the standard protocol during bolus administration of intravenous contrast. RADIATION DOSE REDUCTION: This exam was performed according to the departmental dose-optimization program which includes automated exposure control, adjustment of the mA and/or kV according to patient size and/or use of iterative reconstruction technique. CONTRAST:  157m OMNIPAQUE IOHEXOL 300 MG/ML  SOLN COMPARISON:  None Available. FINDINGS: CT CHEST FINDINGS Cardiovascular: Heart size moderately enlarged. No pericardial effusion. Calcified aortic atherosclerotic changes. No aortic dilation. Normal caliber central pulmonary vasculature. Mediastinum/Nodes: No thoracic inlet, axillary, mediastinal or hilar adenopathy.  Esophagus grossly normal. Lungs/Pleura: Mild basilar atelectasis. Airways are patent. No pleural effusion or dense consolidative changes. No suspicious pulmonary mass or nodule. Musculoskeletal: See below for full musculoskeletal details. No chest wall mass. CT ABDOMEN PELVIS FINDINGS Hepatobiliary: No focal, suspicious hepatic lesion. No pericholecystic stranding. Mild gallbladder distension. No biliary duct dilation. Cholelithiasis. Small portal to hepatic venous shunt in the LEFT hepatic lobe (image 56/2) 6 mm at the site of portal to hepatic venous shunting. Hepatic veins are patent. Portal vein is patent. Pancreas: Mild atrophy of the pancreas without ductal dilation, inflammation or visible lesion. Spleen: Normal. Adrenals/Urinary Tract: Adrenal glands are normal. 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 No suspicious renal lesion. Mild fullness of LEFT ureter and renal pelvis. No discrete site of ureteral transition. No perinephric stranding. LEFT UVJ in urinary bladder cannot be evaluated further due to streak artifact from bilateral hip arthroplasty changes. Stomach/Bowel: Small hiatal hernia. No acute gastrointestinal findings. Mild colonic diverticulosis of the sigmoid colon. Vascular/Lymphatic: Aortic atherosclerosis. No sign of aneurysm. Smooth contour of the IVC. There is no gastrohepatic or hepatoduodenal ligament lymphadenopathy. No retroperitoneal or mesenteric lymphadenopathy. No pelvic sidewall lymphadenopathy. Reproductive: Not well assessed given streak artifact from bilateral hip arthroplasty changes. Other: No ascites. Musculoskeletal: Post bilateral hip arthroplasty changes, incompletely evaluated. Spinal degenerative changes. Degenerative changes most pronounced in the lower thoracic and in the lumbar spine with levo convexity of the lumbar spine related to degenerative changes. No acute or destructive bone process. Signs of  LEFT shoulder arthroplasty as well. IMPRESSION: 1. No evidence of primary or metastatic disease in the chest, abdomen or pelvis. 2. Mild fullness of LEFT ureter and renal pelvis. No discrete site of ureteral transition. LEFT UVJ in urinary bladder cannot be evaluated further due to streak artifact from bilateral hip arthroplasty changes. And consider short correlate with urinalysis interval follow-up with renal sonogram to exclude developing or worsening of collecting system and ureteral dilation in this patient with a very small RIGHT kidney that likely shows limited function relative to the LEFT kidney. Note  that the urinary bladder and LEFT UVJ cannot be assessed for signs of obstructing lesion in this location but the ureter is nondilated below the level of the mid LEFT ureter. 3. Cholelithiasis without evidence of acute cholecystitis. 4. 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 5. Mild colonic diverticulosis of the sigmoid colon. 6. Small hiatal hernia. 7. Aortic atherosclerosis. 8. 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 Aortic Atherosclerosis (ICD10-I70.0). Electronically Signed   By: Zetta Bills M.D.   On: 03/24/2022 15:12   MR BRAIN W WO CONTRAST  Result Date: 03/24/2022 CLINICAL DATA:  Passed out.  Abnormal head CT. EXAM: MRI HEAD WITHOUT AND WITH CONTRAST TECHNIQUE: Multiplanar, multiecho pulse sequences of the brain and surrounding structures were obtained without and with intravenous contrast. CONTRAST:  11m GADAVIST GADOBUTROL 1 MMOL/ML IV SOLN COMPARISON:  Head CT from yesterday and 08/07/2018 FINDINGS: Brain: Mass in the posterior right temporal lobe which is peripherally enhancing at areas showing dense cellular features. Central non enhancement with necrotic appearance. The mass appears intra-axial and measures up to 4 cm. Regional T2 hyperintensity and  swelling at least partially from vasogenic edema. The mass reaches the atrium of the right lateral ventricle without evidence of ependymal spread. No second mass is seen. No abnormality seen in this area on 2020 head CT. No acute infarct, hydrocephalus, or collection. Vascular: Major flow voids and vascular enhancements are preserved Skull and upper cervical spine: No aggressive bone lesion. Sinuses/Orbits: Bilateral cataract resection. Partial left mastoid opacification. 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 Electronically Signed   By: JJorje GuildM.D.   On: 03/24/2022 07:27   CT Head Wo Contrast  Result Date: 03/23/2022 CLINICAL DATA:  Single episode EXAM: CT HEAD WITHOUT CONTRAST TECHNIQUE: Contiguous axial images were obtained from the base of the skull through the vertex without intravenous contrast. RADIATION DOSE REDUCTION: This exam was performed according to the departmental dose-optimization program which includes automated exposure control, adjustment of the mA and/or kV according to patient size and/or use of iterative reconstruction technique. COMPARISON:  Fifty 08/08/2018 FINDINGS: Brain: There is new subcortical hypodensity in the RIGHT parietal temporal lobe (image 13/series 2) which has a rounded shape measuring 1.5 cm. There is a vasogenic edema pattern extending into the temporal operculum (image 9/2. No intracranial hemorrhage. No extra-axial fluid collections. No midline shift or mass effect. Vascular: No hyperdense vessel or unexpected calcification. Skull: Normal. Negative for fracture or focal lesion. Sinuses/Orbits: No acute finding. Other: None. IMPRESSION: 1. New subcortical hypodensity in the RIGHT temporoparietal lobe representing either a white matter infarction or mass lesion. Recommend MRI brain without and with contrast for further characterization. 2. No midline shift or mass effect.   No intraparenchymal hemorrhage. Electronically Signed   By: SSuzy BouchardM.D.   On: 03/23/2022 17:36   DG Chest Portable 1 View  Result Date: 03/23/2022 CLINICAL DATA:  Syncope EXAM: PORTABLE CHEST 1 VIEW COMPARISON:  Chest radiograph November 11, 2017. FINDINGS: Enlarged cardiac silhouette. Aortic atherosclerosis. No focal airspace consolidation or overt pulmonary edema. Left shoulder arthroplasty. Severe degenerative change of the right shoulder. Thoracic spondylosis. IMPRESSION: Enlarged cardiac silhouette without overt pulmonary edema or focal airspace consolidation. Electronically Signed   By: JDahlia BailiffM.D.   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  She has no focal deficits at this time.  We had an extensive conversation with her and family regarding suspected pathology, prognosis, and available treatment pathways for gliomas.  We discussed surgical decision pathways, which will also be reviewed in greater detail with neurosurgeons.  Given her age we did discuss palliative/hospice as well.    They look forward to meeting with surgeons, we will follow up with them after surgical planning and intervention.  All questions were answered. The patient knows to call the clinic with any problems, questions or concerns.  The total time spent in the encounter was 45 minutes and more than 50% was on counseling and review of test results     Ventura Sellers, MD 03/25/2022 2:44 PM

## 2022-03-26 ENCOUNTER — Other Ambulatory Visit: Payer: Self-pay | Admitting: Neurosurgery

## 2022-03-26 DIAGNOSIS — E785 Hyperlipidemia, unspecified: Secondary | ICD-10-CM | POA: Diagnosis present

## 2022-03-26 DIAGNOSIS — K449 Diaphragmatic hernia without obstruction or gangrene: Secondary | ICD-10-CM | POA: Diagnosis not present

## 2022-03-26 DIAGNOSIS — R739 Hyperglycemia, unspecified: Secondary | ICD-10-CM | POA: Diagnosis not present

## 2022-03-26 DIAGNOSIS — R930 Abnormal findings on diagnostic imaging of skull and head, not elsewhere classified: Secondary | ICD-10-CM

## 2022-03-26 DIAGNOSIS — Z9889 Other specified postprocedural states: Secondary | ICD-10-CM | POA: Diagnosis not present

## 2022-03-26 DIAGNOSIS — M1990 Unspecified osteoarthritis, unspecified site: Secondary | ICD-10-CM | POA: Diagnosis not present

## 2022-03-26 DIAGNOSIS — D72823 Leukemoid reaction: Secondary | ICD-10-CM

## 2022-03-26 DIAGNOSIS — G939 Disorder of brain, unspecified: Secondary | ICD-10-CM | POA: Diagnosis not present

## 2022-03-26 DIAGNOSIS — R55 Syncope and collapse: Secondary | ICD-10-CM | POA: Diagnosis not present

## 2022-03-26 DIAGNOSIS — Z8701 Personal history of pneumonia (recurrent): Secondary | ICD-10-CM | POA: Diagnosis not present

## 2022-03-26 DIAGNOSIS — Z96643 Presence of artificial hip joint, bilateral: Secondary | ICD-10-CM | POA: Diagnosis present

## 2022-03-26 DIAGNOSIS — C719 Malignant neoplasm of brain, unspecified: Secondary | ICD-10-CM | POA: Diagnosis not present

## 2022-03-26 DIAGNOSIS — C712 Malignant neoplasm of temporal lobe: Secondary | ICD-10-CM | POA: Diagnosis present

## 2022-03-26 DIAGNOSIS — Z8249 Family history of ischemic heart disease and other diseases of the circulatory system: Secondary | ICD-10-CM | POA: Diagnosis not present

## 2022-03-26 DIAGNOSIS — G9389 Other specified disorders of brain: Secondary | ICD-10-CM | POA: Diagnosis not present

## 2022-03-26 DIAGNOSIS — I10 Essential (primary) hypertension: Secondary | ICD-10-CM | POA: Diagnosis present

## 2022-03-26 DIAGNOSIS — K219 Gastro-esophageal reflux disease without esophagitis: Secondary | ICD-10-CM | POA: Diagnosis present

## 2022-03-26 DIAGNOSIS — D496 Neoplasm of unspecified behavior of brain: Secondary | ICD-10-CM | POA: Diagnosis present

## 2022-03-26 DIAGNOSIS — N179 Acute kidney failure, unspecified: Secondary | ICD-10-CM | POA: Diagnosis not present

## 2022-03-26 DIAGNOSIS — Z79899 Other long term (current) drug therapy: Secondary | ICD-10-CM | POA: Diagnosis not present

## 2022-03-26 LAB — GLUCOSE, CAPILLARY: Glucose-Capillary: 104 mg/dL — ABNORMAL HIGH (ref 70–99)

## 2022-03-26 NOTE — Discharge Summary (Signed)
Physician Discharge Summary  Lauren Hill:[id number] DOB: 03-03-1935 DOA: 03/23/2022  PCP: Burnard Bunting, MD  Admit date: 03/23/2022 Discharge date: 03/26/2022  Admitted From: Home Disposition: Home   Recommendations for Outpatient Follow-up:  Follow up with PCP in 1-2 weeks Follow-up with neurosurgery on Monday for craniotomy as scheduled  Home Health: None  Equipment/Devices: None  Discharge Condition: Stable CODE STATUS: Full Diet recommendation: Low-salt low-fat diet as tolerated  Brief/Interim Summary: Lauren Hill is a 87 y.o. female with medical history significant for HTN, HLD, GERD, chronic leukocytosis, chronic back pain due to lumbar DDD who is admitted for syncope evaluation. 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 Neuro surgery consulted and requested transferring the patient to  [facility].   Patient evaluated by neurosurgery at [facility], no indication for emergent or urgent procedure, as such patient will be discharged home per their recommendations for outpatient follow-up, likely craniotomy on Monday, 03/29/2022 per their documentation.  Patient's other chronic comorbid conditions appear to be stable, reactive leukocytosis and AKI in the setting of dehydration resolved, transient episode of hypoglycemia also resolved with A1c of 5.9% ruling out new diabetes.  Discontinued patient's home losartan and HCTZ initially due to AKI but given her normotensive status we will continue to hold this at discharge.  Discharge Diagnoses:  Principal Problem:   Syncope Active Problems:   Abnormal CT of the head   AKI (acute kidney injury) [facility])   Essential hypertension   Hyperlipidemia   Hyperglycemia   Leukocytosis    Discharge Instructions  Discharge Instructions     Discharge patient   Complete by: As directed    Discharge disposition: 01-Home or Self Care    Discharge patient date: 03/26/2022      Allergies as of 03/26/2022   No Known Allergies      Medication List     STOP taking these medications    acyclovir ointment 5 % Commonly known as: Zovirax   olmesartan-hydrochlorothiazide 40-25 MG tablet Commonly known as: BENICAR HCT   valACYclovir 1000 MG tablet Commonly known as: Valtrex       TAKE these medications    acetaminophen 650 MG CR tablet Commonly known as: TYLENOL Take 1,300 mg by mouth in the morning, at noon, and at bedtime.   amLODipine 5 MG tablet Commonly known as: NORVASC Take 5 mg by mouth 2 (two) times daily.   atenolol 50 MG tablet Commonly known as: TENORMIN Take 50 mg by mouth at bedtime.   atorvastatin 10 MG tablet Commonly known as: LIPITOR Take 10 mg by mouth daily at 6 PM.   cholecalciferol 25 MCG (1000 UNIT) tablet Commonly known as: VITAMIN D3 Take 1,000 Units by mouth daily.   famotidine 10 MG tablet Commonly known as: PEPCID Take 10 mg by mouth at bedtime as needed for heartburn or indigestion.   magnesium gluconate 500 MG tablet Commonly known as: MAGONATE Take 500 mg by mouth at bedtime.   multivitamin-lutein Caps capsule Take 1 capsule by mouth in the morning and at bedtime.   Potassium 99 MG Tabs Take 99 mg by mouth at bedtime.        No Known Allergies  Consultations: Neurosurgery  Procedures/Studies: US RENAL  Result Date: 03/25/2022 CLINICAL DATA:  Normal CT scan. Fullness of the left renal collecting system and ureter. EXAM: RENAL / URINARY TRACT ULTRASOUND COMPLETE COMPARISON:  CT of the abdomen and pelvis 03/24/2022  FINDINGS: Right Kidney: Renal measurements: 8.8 x 3.7 x 3.5 cm = volume: 60.6 mL. Echogenicity within normal limits. No mass or hydronephrosis visualized. Parenchymal thinning noted. Left Kidney: Renal measurements: 11.1 x 6.2 x 4.7 cm = volume: 171.0 ML. No hydronephrosis is present. Prominent central sinus fat again noted. Parenchyma is  within normal limits. Bladder: Appears normal for degree of bladder distention. Details are not well seen due to lack of distension. IMPRESSION: 1. No hydronephrosis. 2. Right kidney is smaller than the left with parenchymal thinning. 3. Left kidney is normal in appearance. 4. Details of the bladder are not well seen due to lack of distension. Electronically Signed   By: San Morelle M.D.   On: 03/25/2022 14:31   ECHOCARDIOGRAM COMPLETE  Result Date: 03/24/2022    ECHOCARDIOGRAM REPORT   Patient Name:   Lauren Hill Date of Exam: 03/24/2022 Medical Rec #:  [id number]          Height:       62.0 in Accession #:    [id number]         Weight:       163.1 lb Date of Birth:  [birth year]         BSA:          1.753 m Patient Age:    20 years           BP:           141/70 mmHg Patient Gender: F                  HR:           97 bpm. Exam Location:  Inpatient Procedure: 2D Echo, Cardiac Doppler and Color Doppler Indications:    Syncope  History:        Patient has no prior history of Echocardiogram examinations.                 Signs/Symptoms:Syncope; Risk Factors:Hypertension and                 Dyslipidemia. Leukocytosis.  Sonographer:    Eartha Inch Referring Phys: [id number] VISHAL R PATEL  Sonographer Comments: Technically difficult study due to poor echo windows. Image acquisition challenging due to patient body habitus and Image acquisition challenging due to respiratory motion. IMPRESSIONS  1. Left ventricular ejection fraction, by estimation, is 65 to 70%. The left ventricle has normal function. The left ventricle has no regional wall motion abnormalities. Left ventricular diastolic parameters are indeterminate.  2. Right ventricular systolic function is normal. The right ventricular size is normal. There is mildly elevated pulmonary artery systolic pressure.  3. Left atrial size was moderately dilated.  4. Right atrial size was mildly dilated.  5. The mitral valve is normal in structure.  Trivial mitral valve regurgitation. No evidence of mitral stenosis.  6. The aortic valve is tricuspid. There is mild calcification of the aortic valve. Aortic valve regurgitation is trivial. Aortic valve sclerosis/calcification is present, without any evidence of aortic stenosis.  7. The inferior vena cava is normal in size with greater than 50% respiratory variability, suggesting right atrial pressure of 3 mmHg. FINDINGS  Left Ventricle: Left ventricular ejection fraction, by estimation, is 65 to 70%. The left ventricle has normal function. The left ventricle has no regional wall motion abnormalities. The left ventricular internal cavity size was normal in size. There is  no left ventricular hypertrophy. Left ventricular diastolic parameters are indeterminate. Right Ventricle: The right ventricular size  is normal. No increase in right ventricular wall thickness. Right ventricular systolic function is normal. There is mildly elevated pulmonary artery systolic pressure. The tricuspid regurgitant velocity is 2.84  m/s, and with an assumed right atrial pressure of 8 mmHg, the estimated right ventricular systolic pressure is 74.1 mmHg. Left Atrium: Left atrial size was moderately dilated. Right Atrium: Right atrial size was mildly dilated. Pericardium: There is no evidence of pericardial effusion. Mitral Valve: The mitral valve is normal in structure. Mild mitral annular calcification. Trivial mitral valve regurgitation. No evidence of mitral valve stenosis. Tricuspid Valve: The tricuspid valve is normal in structure. Tricuspid valve regurgitation is mild . No evidence of tricuspid stenosis. Aortic Valve: The aortic valve is tricuspid. There is mild calcification of the aortic valve. Aortic valve regurgitation is trivial. Aortic valve sclerosis/calcification is present, without any evidence of aortic stenosis. Pulmonic Valve: The pulmonic valve was normal in structure. Pulmonic valve regurgitation is trivial. No  evidence of pulmonic stenosis. Aorta: The aortic root is normal in size and structure. Venous: The inferior vena cava is normal in size with greater than 50% respiratory variability, suggesting right atrial pressure of 3 mmHg. IAS/Shunts: No atrial level shunt detected by color flow Doppler.  LEFT VENTRICLE PLAX 2D LVIDd:         3.60 cm     Diastology LVIDs:         2.20 cm     LV e' medial:  12.10 cm/s LV PW:         0.90 cm     LV e' lateral: 6.96 cm/s LV IVS:        0.90 cm LVOT diam:     1.80 cm LV SV:         69 LV SV Index:   39 LVOT Area:     2.54 cm  LV Volumes (MOD) LV vol d, MOD A2C: 60.5 ml LV vol d, MOD A4C: 65.9 ml LV vol s, MOD A2C: 18.5 ml LV vol s, MOD A4C: 22.6 ml LV SV MOD A2C:     42.0 ml LV SV MOD A4C:     65.9 ml LV SV MOD BP:      41.5 ml RIGHT VENTRICLE             IVC RV S prime:     13.30 cm/s  IVC diam: 1.80 cm TAPSE (M-mode): 2.1 cm LEFT ATRIUM             Index        RIGHT ATRIUM           Index LA diam:        4.00 cm 2.28 cm/m   RA Area:     13.30 cm LA Vol (A2C):   58.0 ml 33.08 ml/m  RA Volume:   31.50 ml  17.97 ml/m LA Vol (A4C):   75.8 ml 43.24 ml/m LA Biplane Vol: 67.2 ml 38.33 ml/m  AORTIC VALVE LVOT Vmax:   123.00 cm/s LVOT Vmean:  86.100 cm/s LVOT VTI:    0.272 m  AORTA Ao Root diam: 3.00 cm Ao Asc diam:  2.80 cm TRICUSPID VALVE TR Peak grad:   32.3 mmHg TR Mean grad:   23.0 mmHg TR Vmax:        284.00 cm/s TR Vmean:       229.0 cm/s  SHUNTS Systemic VTI:  0.27 m Systemic Diam: 1.80 cm Glori Bickers MD Electronically signed by Glori Bickers MD Signature Date/Time: 03/24/2022/4:57:10  PM    Final    CT CHEST ABDOMEN PELVIS W CONTRAST  Result Date: 03/24/2022 CLINICAL DATA:  Suspected metastatic disease the brain versus is CNS primary, unknown primary neoplasm. * Tracking Code: BO * EXAM: CT CHEST, ABDOMEN, AND PELVIS WITH CONTRAST TECHNIQUE: Multidetector CT imaging of the chest, abdomen and pelvis was performed following the standard protocol during bolus  administration of intravenous contrast. RADIATION DOSE REDUCTION: This exam was performed according to the departmental dose-optimization program which includes automated exposure control, adjustment of the mA and/or kV according to patient size and/or use of iterative reconstruction technique. CONTRAST:  117m OMNIPAQUE IOHEXOL 300 MG/ML  SOLN COMPARISON:  None Available. FINDINGS: CT CHEST FINDINGS Cardiovascular: Heart size moderately enlarged. No pericardial effusion. Calcified aortic atherosclerotic changes. No aortic dilation. Normal caliber central pulmonary vasculature. Mediastinum/Nodes: No thoracic inlet, axillary, mediastinal or hilar adenopathy. Esophagus grossly normal. Lungs/Pleura: Mild basilar atelectasis. Airways are patent. No pleural effusion or dense consolidative changes. No suspicious pulmonary mass or nodule. Musculoskeletal: See below for full musculoskeletal details. No chest wall mass. CT ABDOMEN PELVIS FINDINGS Hepatobiliary: No focal, suspicious hepatic lesion. No pericholecystic stranding. Mild gallbladder distension. No biliary duct dilation. Cholelithiasis. Small portal to hepatic venous shunt in the LEFT hepatic lobe (image 56/2) 6 mm at the site of portal to hepatic venous shunting. Hepatic veins are patent. Portal vein is patent. Pancreas: Mild atrophy of the pancreas without ductal dilation, inflammation or visible lesion. Spleen: Normal. Adrenals/Urinary Tract: Adrenal glands are normal. 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 No suspicious renal lesion. Mild fullness of LEFT ureter and renal pelvis. No discrete site of ureteral transition. No perinephric stranding. LEFT UVJ in urinary bladder cannot be evaluated further due to streak artifact from bilateral hip arthroplasty changes. Stomach/Bowel: Small hiatal hernia. No acute gastrointestinal findings. Mild colonic diverticulosis of the sigmoid colon.  Vascular/Lymphatic: Aortic atherosclerosis. No sign of aneurysm. Smooth contour of the IVC. There is no gastrohepatic or hepatoduodenal ligament lymphadenopathy. No retroperitoneal or mesenteric lymphadenopathy. No pelvic sidewall lymphadenopathy. Reproductive: Not well assessed given streak artifact from bilateral hip arthroplasty changes. Other: No ascites. Musculoskeletal: Post bilateral hip arthroplasty changes, incompletely evaluated. Spinal degenerative changes. Degenerative changes most pronounced in the lower thoracic and in the lumbar spine with levo convexity of the lumbar spine related to degenerative changes. No acute or destructive bone process. Signs of LEFT shoulder arthroplasty as well. IMPRESSION: 1. No evidence of primary or metastatic disease in the chest, abdomen or pelvis. 2. Mild fullness of LEFT ureter and renal pelvis. No discrete site of ureteral transition. LEFT UVJ in urinary bladder cannot be evaluated further due to streak artifact from bilateral hip arthroplasty changes. And consider short correlate with urinalysis interval follow-up with renal sonogram to exclude developing or worsening of collecting system and ureteral dilation in this patient with a very small RIGHT kidney that likely shows limited function relative to the LEFT kidney. Note that the urinary bladder and LEFT UVJ cannot be assessed for signs of obstructing lesion in this location but the ureter is nondilated below the level of the mid LEFT ureter. 3. Cholelithiasis without evidence of acute cholecystitis. 4. 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 5. Mild colonic diverticulosis of the sigmoid colon. 6. Small hiatal hernia. 7. Aortic atherosclerosis. 8. Smooth RIGHT renal atrophy in the setting of renal vascular disease at  the origin of the RIGHT renal artery. Aortic Atherosclerosis (ICD10-I70.0). Electronically Signed   By: Zetta Bills M.D.    On: 03/24/2022 15:12   MR BRAIN W WO CONTRAST  Result Date: 03/24/2022 CLINICAL DATA:  Passed out.  Abnormal head CT. EXAM: MRI HEAD WITHOUT AND WITH CONTRAST TECHNIQUE: Multiplanar, multiecho pulse sequences of the brain and surrounding structures were obtained without and with intravenous contrast. CONTRAST:  83m GADAVIST GADOBUTROL 1 MMOL/ML IV SOLN COMPARISON:  Head CT from yesterday and 08/07/2018 FINDINGS: Brain: Mass in the posterior right temporal lobe which is peripherally enhancing at areas showing dense cellular features. Central non enhancement with necrotic appearance. The mass appears intra-axial and measures up to 4 cm. Regional T2 hyperintensity and swelling at least partially from vasogenic edema. The mass reaches the atrium of the right lateral ventricle without evidence of ependymal spread. No second mass is seen. No abnormality seen in this area on 2020 head CT. No acute infarct, hydrocephalus, or collection. Vascular: Major flow voids and vascular enhancements are preserved Skull and upper cervical spine: No aggressive bone lesion. Sinuses/Orbits: Bilateral cataract resection. Partial left mastoid opacification. 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 Electronically Signed   By: JJorje GuildM.D.   On: 03/24/2022 07:27   CT Head Wo Contrast  Result Date: 03/23/2022 CLINICAL DATA:  Single episode EXAM: CT HEAD WITHOUT CONTRAST TECHNIQUE: Contiguous axial images were obtained from the base of the skull through the vertex without intravenous contrast. RADIATION DOSE REDUCTION: This exam was performed according to the departmental dose-optimization program which includes automated exposure control, adjustment of the mA and/or kV according to patient size and/or use of iterative reconstruction technique. COMPARISON:  Fifty 08/08/2018 FINDINGS: Brain: There is new subcortical hypodensity in the RIGHT  parietal temporal lobe (image 13/series 2) which has a rounded shape measuring 1.5 cm. There is a vasogenic edema pattern extending into the temporal operculum (image 9/2. No intracranial hemorrhage. No extra-axial fluid collections. No midline shift or mass effect. Vascular: No hyperdense vessel or unexpected calcification. Skull: Normal. Negative for fracture or focal lesion. Sinuses/Orbits: No acute finding. Other: None. IMPRESSION: 1. New subcortical hypodensity in the RIGHT temporoparietal lobe representing either a white matter infarction or mass lesion. Recommend MRI brain without and with contrast for further characterization. 2. No midline shift or mass effect.  No intraparenchymal hemorrhage. Electronically Signed   By: SSuzy BouchardM.D.   On: 03/23/2022 17:36   DG Chest Portable 1 View  Result Date: 03/23/2022 CLINICAL DATA:  Syncope EXAM: PORTABLE CHEST 1 VIEW COMPARISON:  Chest radiograph November 11, 2017. FINDINGS: Enlarged cardiac silhouette. Aortic atherosclerosis. No focal airspace consolidation or overt pulmonary edema. Left shoulder arthroplasty. Severe degenerative change of the right shoulder. Thoracic spondylosis. IMPRESSION: Enlarged cardiac silhouette without overt pulmonary edema or focal airspace consolidation. Electronically Signed   By: JDahlia BailiffM.D.   On: 03/23/2022 13:31     Subjective: No acute issues or events overnight   Discharge Exam: Vitals:   03/26/22 0513 03/26/22 0844  BP: (!) 140/74 130/64  Pulse: 81 84  Resp: 18 17  Temp: 98 F (36.7 C) 97.9 F (36.6 C)  SpO2: 95% 97%   Vitals:   03/25/22 2023 03/26/22 0025 03/26/22 0513 03/26/22 0844  BP: 125/74 137/69 (!) 140/74 130/64  Pulse: 97 91 81 84  Resp: '17 20 18 17  '$ Temp: 99.1 F (37.3 C) 98.4 F (36.9 C) 98  F (36.7 C) 97.9 F (36.6 C)  TempSrc: Oral Oral Oral Oral  SpO2: 95% 95% 95% 97%  Weight:   83.6 kg   Height:        General: Pt is alert, awake, not in acute  distress Cardiovascular: RRR, S1/S2 +, no rubs, no gallops Respiratory: CTA bilaterally, no wheezing, no rhonchi Abdominal: Soft, NT, ND, bowel sounds + Extremities: no edema, no cyanosis    The results of significant diagnostics from this hospitalization (including imaging, microbiology, ancillary and laboratory) are listed below for reference.     Microbiology: No results found for this or any previous visit (from the past 240 hour(s)).   Labs: BNP (last 3 results) No results for input(s): "BNP" in the last 8760 hours. Basic Metabolic Panel: Recent Labs  Lab 03/23/22 1243 03/23/22 1632 03/24/22 0517  NA 135 136 139  K 3.9 3.6 3.6  CL 101  --  107  CO2 14*  --  23  GLUCOSE 154*  --  101*  BUN 34*  --  23  CREATININE 1.35*  --  0.90  CALCIUM 10.3  --  9.6   Liver Function Tests: No results for input(s): "AST", "ALT", "ALKPHOS", "BILITOT", "PROT", "ALBUMIN" in the last 168 hours. No results for input(s): "LIPASE", "AMYLASE" in the last 168 hours. No results for input(s): "AMMONIA" in the last 168 hours. CBC: Recent Labs  Lab 03/23/22 1243 03/23/22 1632 03/24/22 0517  WBC 16.1*  --  13.8*  NEUTROABS  --   --  9.7*  HGB 15.5* 13.9 12.8  HCT 45.9 41.0 40.4  MCV 94.1  --  98.5  PLT 363  --  267   Cardiac Enzymes: No results for input(s): "CKTOTAL", "CKMB", "CKMBINDEX", "TROPONINI" in the last 168 hours. BNP: Invalid input(s): "POCBNP" CBG: Recent Labs  Lab 03/25/22 0522 03/26/22 0515  GLUCAP 114* 104*   D-Dimer No results for input(s): "DDIMER" in the last 72 hours.  Hgb A1c Recent Labs    03/24/22 0517  HGBA1C 5.9*   Lipid Profile No results for input(s): "CHOL", "HDL", "LDLCALC", "TRIG", "CHOLHDL", "LDLDIRECT" in the last 72 hours. Thyroid function studies No results for input(s): "TSH", "T4TOTAL", "T3FREE", "THYROIDAB" in the last 72 hours.  Invalid input(s): "FREET3" Anemia work up No results for input(s): "VITAMINB12", "FOLATE", "FERRITIN",  "TIBC", "IRON", "RETICCTPCT" in the last 72 hours. Urinalysis    Component Value Date/Time   COLORURINE YELLOW 03/23/2022 [place] 03/23/2022 1653   LABSPEC 1.016 03/23/2022 1653   PHURINE 6.0 03/23/2022 1653   GLUCOSEU NEGATIVE 03/23/2022 1653   HGBUR TRACE (A) 03/23/2022 1653   BILIRUBINUR NEGATIVE 03/23/2022 1653   BILIRUBINUR neg 11/23/2021 1424   KETONESUR NEGATIVE 03/23/2022 1653   PROTEINUR TRACE (A) 03/23/2022 1653   UROBILINOGEN 0.2 11/23/2021 1424   UROBILINOGEN 0.2 10/20/2009 1150   NITRITE NEGATIVE 03/23/2022 1653   LEUKOCYTESUR NEGATIVE 03/23/2022 1653   Sepsis Labs Recent Labs  Lab 03/23/22 1243 03/24/22 0517  WBC 16.1* 13.8*   Microbiology No results found for this or any previous visit (from the past 240 hour(s)).   Time coordinating discharge: Over 30 minutes  SIGNED:   Little Ishikawa, DO [facility] 03/26/2022, 5:38 PM Pager   If 7PM-7AM, please contact night-coverage [email or web address]

## 2022-03-26 NOTE — Progress Notes (Signed)
Mobility Specialist - Progress Note   03/26/22 1123  Mobility  Activity Ambulated independently in room  Level of Assistance Independent  Assistive Device None  Distance Ambulated (ft) 30 ft  Activity Response Tolerated well  Mobility Referral No  $Mobility charge 1 Mobility   Pt received at door and agreeable. No complaints throughout. Left at chair with all needs met  Franki Monte  Mobility Specialist

## 2022-03-26 NOTE — Progress Notes (Signed)
Subjective: The patient is alert and pleasant.  He has no complaints.  She is looking forward to discharge.  Objective: Vital signs in last 24 hours: Temp:  [98 F (36.7 C)-99.1 F (37.3 C)] 98 F (36.7 C) (10/20 0513) Pulse Rate:  [81-101] 81 (10/20 0513) Resp:  [16-20] 18 (10/20 0513) BP: (125-157)/(62-74) 140/74 (10/20 0513) SpO2:  [95 %-98 %] 95 % (10/20 0513) Estimated body mass index is 30.93 kg/m as calculated from the following:   Height as of this encounter: '5\' 2"'$  (1.575 m).   Weight as of this encounter: 76.7 kg.   Intake/Output from previous day: 10/19 0701 - 10/20 0700 In: 100 [P.O.:100] Out: -  Intake/Output this shift: Total I/O In: 100 [P.O.:100] Out: -   Physical exam patient is alert and pleasant.  Her speech and strength are normal.  Lab Results: Recent Labs    03/23/22 1243 03/23/22 1632 03/24/22 0517  WBC 16.1*  --  13.8*  HGB 15.5* 13.9 12.8  HCT 45.9 41.0 40.4  PLT 363  --  267   BMET Recent Labs    03/23/22 1243 03/23/22 1632 03/24/22 0517  NA 135 136 139  K 3.9 3.6 3.6  CL 101  --  107  CO2 14*  --  23  GLUCOSE 154*  --  101*  BUN 34*  --  23  CREATININE 1.35*  --  0.90  CALCIUM 10.3  --  9.6    Studies/Results: US RENAL  Result Date: 03/25/2022 CLINICAL DATA:  Normal CT scan. Fullness of the left renal collecting system and ureter. EXAM: RENAL / URINARY TRACT ULTRASOUND COMPLETE COMPARISON:  CT of the abdomen and pelvis 03/24/2022 FINDINGS: Right Kidney: Renal measurements: 8.8 x 3.7 x 3.5 cm = volume: 60.6 mL. Echogenicity within normal limits. No mass or hydronephrosis visualized. Parenchymal thinning noted. Left Kidney: Renal measurements: 11.1 x 6.2 x 4.7 cm = volume: 171.0 ML. No hydronephrosis is present. Prominent central sinus fat again noted. Parenchyma is within normal limits. Bladder: Appears normal for degree of bladder distention. Details are not well seen due to lack of distension. IMPRESSION: 1. No hydronephrosis. 2.  Right kidney is smaller than the left with parenchymal thinning. 3. Left kidney is normal in appearance. 4. Details of the bladder are not well seen due to lack of distension. Electronically Signed   By: San Morelle M.D.   On: 03/25/2022 14:31   ECHOCARDIOGRAM COMPLETE  Result Date: 03/24/2022    ECHOCARDIOGRAM REPORT   Patient Name:   Lauren Hill Date of Exam: 03/24/2022 Medical Rec #:  [id number]          Height:       62.0 in Accession #:    [id number]         Weight:       163.1 lb Date of Birth:  [birth year]         BSA:          1.753 m Patient Age:    87 years           BP:           141/70 mmHg Patient Gender: F                  HR:           97 bpm. Exam Location:  Inpatient Procedure: 2D Echo, Cardiac Doppler and Color Doppler Indications:    Syncope  History:  Patient has no prior history of Echocardiogram examinations.                 Signs/Symptoms:Syncope; Risk Factors:Hypertension and                 Dyslipidemia. Leukocytosis.  Sonographer:    Eartha Inch Referring Phys: [id number] VISHAL R PATEL  Sonographer Comments: Technically difficult study due to poor echo windows. Image acquisition challenging due to patient body habitus and Image acquisition challenging due to respiratory motion. IMPRESSIONS  1. Left ventricular ejection fraction, by estimation, is 65 to 70%. The left ventricle has normal function. The left ventricle has no regional wall motion abnormalities. Left ventricular diastolic parameters are indeterminate.  2. Right ventricular systolic function is normal. The right ventricular size is normal. There is mildly elevated pulmonary artery systolic pressure.  3. Left atrial size was moderately dilated.  4. Right atrial size was mildly dilated.  5. The mitral valve is normal in structure. Trivial mitral valve regurgitation. No evidence of mitral stenosis.  6. The aortic valve is tricuspid. There is mild calcification of the aortic valve. Aortic valve  regurgitation is trivial. Aortic valve sclerosis/calcification is present, without any evidence of aortic stenosis.  7. The inferior vena cava is normal in size with greater than 50% respiratory variability, suggesting right atrial pressure of 3 mmHg. FINDINGS  Left Ventricle: Left ventricular ejection fraction, by estimation, is 65 to 70%. The left ventricle has normal function. The left ventricle has no regional wall motion abnormalities. The left ventricular internal cavity size was normal in size. There is  no left ventricular hypertrophy. Left ventricular diastolic parameters are indeterminate. Right Ventricle: The right ventricular size is normal. No increase in right ventricular wall thickness. Right ventricular systolic function is normal. There is mildly elevated pulmonary artery systolic pressure. The tricuspid regurgitant velocity is 2.84  m/s, and with an assumed right atrial pressure of 8 mmHg, the estimated right ventricular systolic pressure is 55.7 mmHg. Left Atrium: Left atrial size was moderately dilated. Right Atrium: Right atrial size was mildly dilated. Pericardium: There is no evidence of pericardial effusion. Mitral Valve: The mitral valve is normal in structure. Mild mitral annular calcification. Trivial mitral valve regurgitation. No evidence of mitral valve stenosis. Tricuspid Valve: The tricuspid valve is normal in structure. Tricuspid valve regurgitation is mild . No evidence of tricuspid stenosis. Aortic Valve: The aortic valve is tricuspid. There is mild calcification of the aortic valve. Aortic valve regurgitation is trivial. Aortic valve sclerosis/calcification is present, without any evidence of aortic stenosis. Pulmonic Valve: The pulmonic valve was normal in structure. Pulmonic valve regurgitation is trivial. No evidence of pulmonic stenosis. Aorta: The aortic root is normal in size and structure. Venous: The inferior vena cava is normal in size with greater than 50% respiratory  variability, suggesting right atrial pressure of 3 mmHg. IAS/Shunts: No atrial level shunt detected by color flow Doppler.  LEFT VENTRICLE PLAX 2D LVIDd:         3.60 cm     Diastology LVIDs:         2.20 cm     LV e' medial:  12.10 cm/s LV PW:         0.90 cm     LV e' lateral: 6.96 cm/s LV IVS:        0.90 cm LVOT diam:     1.80 cm LV SV:         69 LV SV Index:   39 LVOT Area:  2.54 cm  LV Volumes (MOD) LV vol d, MOD A2C: 60.5 ml LV vol d, MOD A4C: 65.9 ml LV vol s, MOD A2C: 18.5 ml LV vol s, MOD A4C: 22.6 ml LV SV MOD A2C:     42.0 ml LV SV MOD A4C:     65.9 ml LV SV MOD BP:      41.5 ml RIGHT VENTRICLE             IVC RV S prime:     13.30 cm/s  IVC diam: 1.80 cm TAPSE (M-mode): 2.1 cm LEFT ATRIUM             Index        RIGHT ATRIUM           Index LA diam:        4.00 cm 2.28 cm/m   RA Area:     13.30 cm LA Vol (A2C):   58.0 ml 33.08 ml/m  RA Volume:   31.50 ml  17.97 ml/m LA Vol (A4C):   75.8 ml 43.24 ml/m LA Biplane Vol: 67.2 ml 38.33 ml/m  AORTIC VALVE LVOT Vmax:   123.00 cm/s LVOT Vmean:  86.100 cm/s LVOT VTI:    0.272 m  AORTA Ao Root diam: 3.00 cm Ao Asc diam:  2.80 cm TRICUSPID VALVE TR Peak grad:   32.3 mmHg TR Mean grad:   23.0 mmHg TR Vmax:        284.00 cm/s TR Vmean:       229.0 cm/s  SHUNTS Systemic VTI:  0.27 m Systemic Diam: 1.80 cm Glori Bickers MD Electronically signed by Glori Bickers MD Signature Date/Time: 03/24/2022/4:57:10 PM    Final    CT CHEST ABDOMEN PELVIS W CONTRAST  Result Date: 03/24/2022 CLINICAL DATA:  Suspected metastatic disease the brain versus is CNS primary, unknown primary neoplasm. * Tracking Code: BO * EXAM: CT CHEST, ABDOMEN, AND PELVIS WITH CONTRAST TECHNIQUE: Multidetector CT imaging of the chest, abdomen and pelvis was performed following the standard protocol during bolus administration of intravenous contrast. RADIATION DOSE REDUCTION: This exam was performed according to the departmental dose-optimization program which includes automated  exposure control, adjustment of the mA and/or kV according to patient size and/or use of iterative reconstruction technique. CONTRAST:  167m OMNIPAQUE IOHEXOL 300 MG/ML  SOLN COMPARISON:  None Available. FINDINGS: CT CHEST FINDINGS Cardiovascular: Heart size moderately enlarged. No pericardial effusion. Calcified aortic atherosclerotic changes. No aortic dilation. Normal caliber central pulmonary vasculature. Mediastinum/Nodes: No thoracic inlet, axillary, mediastinal or hilar adenopathy. Esophagus grossly normal. Lungs/Pleura: Mild basilar atelectasis. Airways are patent. No pleural effusion or dense consolidative changes. No suspicious pulmonary mass or nodule. Musculoskeletal: See below for full musculoskeletal details. No chest wall mass. CT ABDOMEN PELVIS FINDINGS Hepatobiliary: No focal, suspicious hepatic lesion. No pericholecystic stranding. Mild gallbladder distension. No biliary duct dilation. Cholelithiasis. Small portal to hepatic venous shunt in the LEFT hepatic lobe (image 56/2) 6 mm at the site of portal to hepatic venous shunting. Hepatic veins are patent. Portal vein is patent. Pancreas: Mild atrophy of the pancreas without ductal dilation, inflammation or visible lesion. Spleen: Normal. Adrenals/Urinary Tract: Adrenal glands are normal. 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 No suspicious renal lesion. Mild fullness of LEFT ureter and renal pelvis. No discrete site of ureteral transition. No perinephric stranding. LEFT UVJ in urinary bladder cannot be evaluated further due to streak artifact from bilateral hip arthroplasty changes.  Stomach/Bowel: Small hiatal hernia. No acute gastrointestinal findings. Mild colonic diverticulosis of the sigmoid colon. Vascular/Lymphatic: Aortic atherosclerosis. No sign of aneurysm. Smooth contour of the IVC. There is no gastrohepatic or hepatoduodenal ligament lymphadenopathy. No retroperitoneal  or mesenteric lymphadenopathy. No pelvic sidewall lymphadenopathy. Reproductive: Not well assessed given streak artifact from bilateral hip arthroplasty changes. Other: No ascites. Musculoskeletal: Post bilateral hip arthroplasty changes, incompletely evaluated. Spinal degenerative changes. Degenerative changes most pronounced in the lower thoracic and in the lumbar spine with levo convexity of the lumbar spine related to degenerative changes. No acute or destructive bone process. Signs of LEFT shoulder arthroplasty as well. IMPRESSION: 1. No evidence of primary or metastatic disease in the chest, abdomen or pelvis. 2. Mild fullness of LEFT ureter and renal pelvis. No discrete site of ureteral transition. LEFT UVJ in urinary bladder cannot be evaluated further due to streak artifact from bilateral hip arthroplasty changes. And consider short correlate with urinalysis interval follow-up with renal sonogram to exclude developing or worsening of collecting system and ureteral dilation in this patient with a very small RIGHT kidney that likely shows limited function relative to the LEFT kidney. Note that the urinary bladder and LEFT UVJ cannot be assessed for signs of obstructing lesion in this location but the ureter is nondilated below the level of the mid LEFT ureter. 3. Cholelithiasis without evidence of acute cholecystitis. 4. 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 5. Mild colonic diverticulosis of the sigmoid colon. 6. Small hiatal hernia. 7. Aortic atherosclerosis. 8. 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 Aortic Atherosclerosis (ICD10-I70.0). Electronically Signed   By: Zetta Bills M.D.   On: 03/24/2022 15:12   MR BRAIN W WO CONTRAST  Result Date: 03/24/2022 CLINICAL DATA:  Passed out.  Abnormal head CT. EXAM: MRI HEAD WITHOUT AND WITH CONTRAST TECHNIQUE:  Multiplanar, multiecho pulse sequences of the brain and surrounding structures were obtained without and with intravenous contrast. CONTRAST:  19m GADAVIST GADOBUTROL 1 MMOL/ML IV SOLN COMPARISON:  Head CT from yesterday and 08/07/2018 FINDINGS: Brain: Mass in the posterior right temporal lobe which is peripherally enhancing at areas showing dense cellular features. Central non enhancement with necrotic appearance. The mass appears intra-axial and measures up to 4 cm. Regional T2 hyperintensity and swelling at least partially from vasogenic edema. The mass reaches the atrium of the right lateral ventricle without evidence of ependymal spread. No second mass is seen. No abnormality seen in this area on 2020 head CT. No acute infarct, hydrocephalus, or collection. Vascular: Major flow voids and vascular enhancements are preserved Skull and upper cervical spine: No aggressive bone lesion. Sinuses/Orbits: Bilateral cataract resection. Partial left mastoid opacification. 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 Electronically Signed   By: JJorje GuildM.D.   On: 03/24/2022 07:27    Assessment/Plan: Right brain tumor: I have answered all the patient's questions.  She has decided proceed with a craniotomy for resection of her lesion.  We may be able to get this on the OR schedule for Monday afternoon.  We will confirm this later on.  She can be discharged to home and my office can arrange for her to come back on Monday for surgery.  I do not think she needs steroids or anticonvulsants presently.  LOS: 0 days  Ophelia Charter 03/26/2022, 6:49 AM

## 2022-03-26 NOTE — Progress Notes (Signed)
AVS given and reviewed with pt and daughter, [name], at bedside. Medications discussed. Office contact information for Dr Newman Pies provided to pt and daughter, [name], at bedside. All questions answered to satisfaction. Pt and daughter, [name], verbalized understanding of information given. Pt escorted off the unit with all belongings via wheelchair by volunteer services.

## 2022-03-26 NOTE — Progress Notes (Signed)
Spoke with pt's daughter, [name] for pre-op call. Pt was just discharged from this hospital today. I gave [name] pre-op instructions only because she was in the middle of dinner with family.  Shower instructions given also.

## 2022-03-26 NOTE — Plan of Care (Signed)
  Problem: Pain Managment: Goal: General experience of comfort will improve Outcome: Progressing   Problem: Safety: Goal: Ability to remain free from injury will improve Outcome: Progressing   Problem: Skin Integrity: Goal: Risk for impaired skin integrity will decrease Outcome: Progressing   

## 2022-03-29 ENCOUNTER — Inpatient Hospital Stay (HOSPITAL_COMMUNITY)
Admission: RE | Admit: 2022-03-29 | Discharge: 2022-03-31 | DRG: 027 | Disposition: A | Discharge status: Home / Self Care | Payer: PPO | Attending: Neurosurgery | Admitting: Neurosurgery

## 2022-03-29 ENCOUNTER — Inpatient Hospital Stay (HOSPITAL_COMMUNITY)
Admission: RE | Disposition: A | Discharge status: Home / Self Care | Payer: Self-pay | Source: Home / Self Care | Attending: Neurosurgery

## 2022-03-29 ENCOUNTER — Inpatient Hospital Stay (HOSPITAL_COMMUNITY): Payer: PPO | Admitting: Certified Registered"

## 2022-03-29 ENCOUNTER — Encounter (HOSPITAL_COMMUNITY): Payer: Self-pay | Admitting: Neurosurgery

## 2022-03-29 ENCOUNTER — Other Ambulatory Visit: Payer: Self-pay

## 2022-03-29 ENCOUNTER — Other Ambulatory Visit: Payer: Self-pay | Admitting: Radiation Therapy

## 2022-03-29 ENCOUNTER — Inpatient Hospital Stay: Payer: PPO | Attending: Internal Medicine

## 2022-03-29 ENCOUNTER — Other Ambulatory Visit: Payer: Self-pay | Admitting: Neurosurgery

## 2022-03-29 DIAGNOSIS — M1990 Unspecified osteoarthritis, unspecified site: Secondary | ICD-10-CM

## 2022-03-29 DIAGNOSIS — I10 Essential (primary) hypertension: Secondary | ICD-10-CM | POA: Diagnosis present

## 2022-03-29 DIAGNOSIS — E785 Hyperlipidemia, unspecified: Secondary | ICD-10-CM | POA: Diagnosis present

## 2022-03-29 DIAGNOSIS — K219 Gastro-esophageal reflux disease without esophagitis: Secondary | ICD-10-CM | POA: Diagnosis present

## 2022-03-29 DIAGNOSIS — K449 Diaphragmatic hernia without obstruction or gangrene: Secondary | ICD-10-CM

## 2022-03-29 DIAGNOSIS — D499 Neoplasm of unspecified behavior of unspecified site: Secondary | ICD-10-CM

## 2022-03-29 DIAGNOSIS — Z8249 Family history of ischemic heart disease and other diseases of the circulatory system: Secondary | ICD-10-CM

## 2022-03-29 DIAGNOSIS — D496 Neoplasm of unspecified behavior of brain: Secondary | ICD-10-CM | POA: Diagnosis present

## 2022-03-29 DIAGNOSIS — Z9889 Other specified postprocedural states: Secondary | ICD-10-CM

## 2022-03-29 DIAGNOSIS — C712 Malignant neoplasm of temporal lobe: Principal | ICD-10-CM | POA: Diagnosis present

## 2022-03-29 DIAGNOSIS — Z96643 Presence of artificial hip joint, bilateral: Secondary | ICD-10-CM | POA: Diagnosis present

## 2022-03-29 DIAGNOSIS — Z8701 Personal history of pneumonia (recurrent): Secondary | ICD-10-CM | POA: Diagnosis not present

## 2022-03-29 DIAGNOSIS — Z79899 Other long term (current) drug therapy: Secondary | ICD-10-CM | POA: Diagnosis not present

## 2022-03-29 HISTORY — PX: APPLICATION OF CRANIAL NAVIGATION: SHX6578

## 2022-03-29 HISTORY — PX: CRANIOTOMY: SHX93

## 2022-03-29 LAB — POCT I-STAT 7, (LYTES, BLD GAS, ICA,H+H)
Acid-base deficit: 5 mmol/L — ABNORMAL HIGH (ref 0.0–2.0)
Bicarbonate: 20.4 mmol/L (ref 20.0–28.0)
Calcium, Ion: 1.22 mmol/L (ref 1.15–1.40)
HCT: 33 % — ABNORMAL LOW (ref 36.0–46.0)
Hemoglobin: 11.2 g/dL — ABNORMAL LOW (ref 12.0–15.0)
O2 Saturation: 100 %
Patient temperature: 35.8
Potassium: 3.1 mmol/L — ABNORMAL LOW (ref 3.5–5.1)
Sodium: 137 mmol/L (ref 135–145)
TCO2: 22 mmol/L (ref 22–32)
pCO2 arterial: 36.6 mmHg (ref 32–48)
pH, Arterial: 7.348 — ABNORMAL LOW (ref 7.35–7.45)
pO2, Arterial: 189 mmHg — ABNORMAL HIGH (ref 83–108)

## 2022-03-29 LAB — TYPE AND SCREEN
ABO/RH(D): A POS
Antibody Screen: NEGATIVE

## 2022-03-29 LAB — SURGICAL PCR SCREEN
MRSA, PCR: NEGATIVE
Staphylococcus aureus: NEGATIVE

## 2022-03-29 SURGERY — CRANIOTOMY TUMOR EXCISION
Anesthesia: General | Laterality: Right

## 2022-03-29 MED ORDER — HYDROCODONE-ACETAMINOPHEN 5-325 MG PO TABS: 1.0000 | ORAL_TABLET | ORAL | Status: DC | PRN

## 2022-03-29 MED ORDER — ROCURONIUM BROMIDE 10 MG/ML (PF) SYRINGE
PREFILLED_SYRINGE | INTRAVENOUS | Status: AC
  Filled 2022-03-29: qty 10

## 2022-03-29 MED ORDER — ONDANSETRON HCL 4 MG PO TABS: 4.0000 mg | ORAL_TABLET | ORAL | Status: DC | PRN

## 2022-03-29 MED ORDER — SODIUM CHLORIDE 0.9 % IV SOLN: INTRAVENOUS | Status: DC | PRN

## 2022-03-29 MED ORDER — CHLORHEXIDINE GLUCONATE CLOTH 2 % EX PADS
6.0000 | MEDICATED_PAD | Freq: Once | CUTANEOUS | Status: AC
  Administered 2022-03-29: 6 via TOPICAL

## 2022-03-29 MED ORDER — LEVETIRACETAM IN NACL 500 MG/100ML IV SOLN
500.0000 mg | Freq: Two times a day (BID) | INTRAVENOUS | Status: DC
  Administered 2022-03-29 – 2022-03-31 (×4): 500 mg via INTRAVENOUS
  Filled 2022-03-29 (×4): qty 100

## 2022-03-29 MED ORDER — BUPIVACAINE-EPINEPHRINE (PF) 0.5% -1:200000 IJ SOLN
INTRAMUSCULAR | Status: AC
  Filled 2022-03-29: qty 30

## 2022-03-29 MED ORDER — CEFAZOLIN SODIUM-DEXTROSE 2-4 GM/100ML-% IV SOLN
2.0000 g | Freq: Three times a day (TID) | INTRAVENOUS | Status: AC
  Administered 2022-03-29 – 2022-03-30 (×2): 2 g via INTRAVENOUS
  Filled 2022-03-29 (×2): qty 100

## 2022-03-29 MED ORDER — BACITRACIN ZINC 500 UNIT/GM EX OINT
TOPICAL_OINTMENT | CUTANEOUS | Status: DC | PRN
  Administered 2022-03-29: 1 via TOPICAL

## 2022-03-29 MED ORDER — EPHEDRINE SULFATE-NACL 50-0.9 MG/10ML-% IV SOSY
PREFILLED_SYRINGE | INTRAVENOUS | Status: DC | PRN
  Administered 2022-03-29 (×2): 5 mg via INTRAVENOUS

## 2022-03-29 MED ORDER — MICROFIBRILLAR COLL HEMOSTAT EX PADS
MEDICATED_PAD | CUTANEOUS | Status: DC | PRN
  Administered 2022-03-29: 1 via TOPICAL

## 2022-03-29 MED ORDER — DEXAMETHASONE SODIUM PHOSPHATE 10 MG/ML IJ SOLN
6.0000 mg | Freq: Four times a day (QID) | INTRAMUSCULAR | Status: AC
  Administered 2022-03-29 – 2022-03-30 (×4): 6 mg via INTRAVENOUS
  Filled 2022-03-29 (×4): qty 1

## 2022-03-29 MED ORDER — BUPIVACAINE-EPINEPHRINE 0.5% -1:200000 IJ SOLN
INTRAMUSCULAR | Status: DC | PRN
  Administered 2022-03-29: 10 mL

## 2022-03-29 MED ORDER — ACETAMINOPHEN 650 MG RE SUPP: 650.0000 mg | RECTAL | Status: DC | PRN

## 2022-03-29 MED ORDER — MICROFIBRILLAR COLL HEMOSTAT EX POWD
CUTANEOUS | Status: DC | PRN
  Administered 2022-03-29: 5 g via TOPICAL

## 2022-03-29 MED ORDER — CHLORHEXIDINE GLUCONATE CLOTH 2 % EX PADS
6.0000 | MEDICATED_PAD | Freq: Every morning | CUTANEOUS | Status: DC
  Administered 2022-03-30 – 2022-03-31 (×2): 6 via TOPICAL

## 2022-03-29 MED ORDER — DEXAMETHASONE SODIUM PHOSPHATE 10 MG/ML IJ SOLN
INTRAMUSCULAR | Status: DC | PRN
  Administered 2022-03-29: 20 mg via INTRAVENOUS

## 2022-03-29 MED ORDER — BACITRACIN ZINC 500 UNIT/GM EX OINT
TOPICAL_OINTMENT | CUTANEOUS | Status: AC
  Filled 2022-03-29: qty 28.35

## 2022-03-29 MED ORDER — MAGNESIUM OXIDE -MG SUPPLEMENT 400 (240 MG) MG PO TABS
400.0000 mg | ORAL_TABLET | Freq: Every day | ORAL | Status: DC
  Administered 2022-03-29 – 2022-03-30 (×2): 400 mg via ORAL
  Filled 2022-03-29 (×2): qty 1

## 2022-03-29 MED ORDER — PHENYLEPHRINE HCL-NACL 20-0.9 MG/250ML-% IV SOLN
INTRAVENOUS | Status: DC | PRN
  Administered 2022-03-29: 40 ug/min via INTRAVENOUS

## 2022-03-29 MED ORDER — THROMBIN 20000 UNITS EX KIT
PACK | CUTANEOUS | Status: AC
  Filled 2022-03-29: qty 1

## 2022-03-29 MED ORDER — ALBUMIN HUMAN 5 % IV SOLN: INTRAVENOUS | Status: DC | PRN

## 2022-03-29 MED ORDER — LIDOCAINE 2% (20 MG/ML) 5 ML SYRINGE
INTRAMUSCULAR | Status: DC | PRN
  Administered 2022-03-29: 60 mg via INTRAVENOUS

## 2022-03-29 MED ORDER — MICROFIBRILLAR COLL HEMOSTAT EX POWD
CUTANEOUS | Status: AC
  Filled 2022-03-29: qty 5

## 2022-03-29 MED ORDER — CHLORHEXIDINE GLUCONATE 0.12 % MT SOLN
15.0000 mL | Freq: Once | OROMUCOSAL | Status: AC
  Administered 2022-03-29: 15 mL via OROMUCOSAL
  Filled 2022-03-29: qty 15

## 2022-03-29 MED ORDER — ROCURONIUM BROMIDE 10 MG/ML (PF) SYRINGE
PREFILLED_SYRINGE | INTRAVENOUS | Status: DC | PRN
  Administered 2022-03-29: 20 mg via INTRAVENOUS
  Administered 2022-03-29: 60 mg via INTRAVENOUS

## 2022-03-29 MED ORDER — ATENOLOL 25 MG PO TABS
50.0000 mg | ORAL_TABLET | Freq: Every day | ORAL | Status: DC
  Administered 2022-03-29 – 2022-03-30 (×2): 50 mg via ORAL
  Filled 2022-03-29 (×2): qty 1

## 2022-03-29 MED ORDER — CHLORHEXIDINE GLUCONATE CLOTH 2 % EX PADS: 6.0000 | MEDICATED_PAD | Freq: Once | CUTANEOUS | Status: DC

## 2022-03-29 MED ORDER — FENTANYL CITRATE (PF) 250 MCG/5ML IJ SOLN
INTRAMUSCULAR | Status: DC | PRN
  Administered 2022-03-29: 50 ug via INTRAVENOUS

## 2022-03-29 MED ORDER — PROPOFOL 10 MG/ML IV BOLUS
INTRAVENOUS | Status: DC | PRN
  Administered 2022-03-29: 90 mg via INTRAVENOUS

## 2022-03-29 MED ORDER — SODIUM CHLORIDE 0.9 % IV SOLN
0.1500 ug/kg/min | INTRAVENOUS | Status: AC
  Administered 2022-03-29: .3 ug/kg/min via INTRAVENOUS
  Filled 2022-03-29 (×2): qty 2000

## 2022-03-29 MED ORDER — HEMOSTATIC AGENTS (NO CHARGE) OPTIME
TOPICAL | Status: DC | PRN
  Administered 2022-03-29: 1 via TOPICAL

## 2022-03-29 MED ORDER — ATORVASTATIN CALCIUM 10 MG PO TABS
10.0000 mg | ORAL_TABLET | Freq: Every day | ORAL | Status: DC
  Administered 2022-03-30: 10 mg via ORAL
  Filled 2022-03-29: qty 1

## 2022-03-29 MED ORDER — SUGAMMADEX SODIUM 200 MG/2ML IV SOLN
INTRAVENOUS | Status: DC | PRN
  Administered 2022-03-29: 200 mg via INTRAVENOUS

## 2022-03-29 MED ORDER — PROPOFOL 10 MG/ML IV BOLUS
INTRAVENOUS | Status: AC
  Filled 2022-03-29: qty 20

## 2022-03-29 MED ORDER — DEXAMETHASONE SODIUM PHOSPHATE 10 MG/ML IJ SOLN
INTRAMUSCULAR | Status: AC
  Filled 2022-03-29: qty 1

## 2022-03-29 MED ORDER — ACETAMINOPHEN 500 MG PO TABS
1000.0000 mg | ORAL_TABLET | Freq: Once | ORAL | Status: DC
  Filled 2022-03-29: qty 2

## 2022-03-29 MED ORDER — PHENYLEPHRINE 80 MCG/ML (10ML) SYRINGE FOR IV PUSH (FOR BLOOD PRESSURE SUPPORT)
PREFILLED_SYRINGE | INTRAVENOUS | Status: DC | PRN
  Administered 2022-03-29 (×2): 160 ug via INTRAVENOUS

## 2022-03-29 MED ORDER — FENTANYL CITRATE (PF) 250 MCG/5ML IJ SOLN
INTRAMUSCULAR | Status: AC
  Filled 2022-03-29: qty 5

## 2022-03-29 MED ORDER — POTASSIUM CHLORIDE CRYS ER 20 MEQ PO TBCR
20.0000 meq | EXTENDED_RELEASE_TABLET | Freq: Every day | ORAL | Status: DC
  Administered 2022-03-29 – 2022-03-30 (×2): 20 meq via ORAL
  Filled 2022-03-29 (×2): qty 1

## 2022-03-29 MED ORDER — THROMBIN 5000 UNITS EX SOLR: OROMUCOSAL | Status: DC | PRN

## 2022-03-29 MED ORDER — THROMBIN (RECOMBINANT) 5000 UNITS EX SOLR: CUTANEOUS | Status: DC | PRN

## 2022-03-29 MED ORDER — 0.9 % SODIUM CHLORIDE (POUR BTL) OPTIME
TOPICAL | Status: DC | PRN
  Administered 2022-03-29: 1000 mL
  Administered 2022-03-29: 2000 mL

## 2022-03-29 MED ORDER — ORAL CARE MOUTH RINSE: 15.0000 mL | Freq: Once | OROMUCOSAL | Status: AC

## 2022-03-29 MED ORDER — ONDANSETRON HCL 4 MG/2ML IJ SOLN
INTRAMUSCULAR | Status: DC | PRN
  Administered 2022-03-29: 4 mg via INTRAVENOUS

## 2022-03-29 MED ORDER — DEXAMETHASONE SODIUM PHOSPHATE 4 MG/ML IJ SOLN
4.0000 mg | Freq: Four times a day (QID) | INTRAMUSCULAR | Status: DC
  Administered 2022-03-31 (×2): 4 mg via INTRAVENOUS
  Filled 2022-03-29 (×2): qty 1

## 2022-03-29 MED ORDER — ONDANSETRON HCL 4 MG/2ML IJ SOLN
INTRAMUSCULAR | Status: AC
  Filled 2022-03-29: qty 2

## 2022-03-29 MED ORDER — LABETALOL HCL 5 MG/ML IV SOLN: 10.0000 mg | INTRAVENOUS | Status: DC | PRN

## 2022-03-29 MED ORDER — LACTATED RINGERS IV SOLN: INTRAVENOUS | Status: DC

## 2022-03-29 MED ORDER — POTASSIUM CHLORIDE IN NACL 20-0.9 MEQ/L-% IV SOLN
INTRAVENOUS | Status: DC
  Filled 2022-03-29: qty 1000

## 2022-03-29 MED ORDER — POTASSIUM 99 MG PO TABS: 99.0000 mg | ORAL_TABLET | Freq: Every day | ORAL | Status: DC

## 2022-03-29 MED ORDER — DEXAMETHASONE SODIUM PHOSPHATE 4 MG/ML IJ SOLN: 4.0000 mg | Freq: Three times a day (TID) | INTRAMUSCULAR | Status: DC

## 2022-03-29 MED ORDER — THROMBIN 5000 UNITS EX SOLR
CUTANEOUS | Status: AC
  Filled 2022-03-29: qty 15000

## 2022-03-29 MED ORDER — ACETAMINOPHEN 325 MG PO TABS
650.0000 mg | ORAL_TABLET | ORAL | Status: DC | PRN
  Administered 2022-03-30 – 2022-03-31 (×4): 650 mg via ORAL
  Filled 2022-03-29 (×4): qty 2

## 2022-03-29 MED ORDER — DOCUSATE SODIUM 100 MG PO CAPS
100.0000 mg | ORAL_CAPSULE | Freq: Two times a day (BID) | ORAL | Status: DC
  Administered 2022-03-31: 100 mg via ORAL
  Filled 2022-03-29 (×2): qty 1

## 2022-03-29 MED ORDER — SODIUM CHLORIDE 0.9 % IV SOLN: INTRAVENOUS | Status: DC

## 2022-03-29 MED ORDER — PROMETHAZINE HCL 25 MG PO TABS: 12.5000 mg | ORAL_TABLET | ORAL | Status: DC | PRN

## 2022-03-29 MED ORDER — MANNITOL 25 % IV SOLN
INTRAVENOUS | Status: DC | PRN
  Administered 2022-03-29: 12.5 g via INTRAVENOUS

## 2022-03-29 MED ORDER — MORPHINE SULFATE (PF) 2 MG/ML IV SOLN: 1.0000 mg | INTRAVENOUS | Status: DC | PRN

## 2022-03-29 MED ORDER — CEFAZOLIN SODIUM-DEXTROSE 2-4 GM/100ML-% IV SOLN
2.0000 g | INTRAVENOUS | Status: AC
  Administered 2022-03-29: 2 g via INTRAVENOUS
  Filled 2022-03-29: qty 100

## 2022-03-29 MED ORDER — ONDANSETRON HCL 4 MG/2ML IJ SOLN: 4.0000 mg | INTRAMUSCULAR | Status: DC | PRN

## 2022-03-29 MED ORDER — AMLODIPINE BESYLATE 5 MG PO TABS
5.0000 mg | ORAL_TABLET | Freq: Two times a day (BID) | ORAL | Status: DC
  Administered 2022-03-29 – 2022-03-31 (×4): 5 mg via ORAL
  Filled 2022-03-29 (×4): qty 1

## 2022-03-29 MED ORDER — LIDOCAINE 2% (20 MG/ML) 5 ML SYRINGE
INTRAMUSCULAR | Status: AC
  Filled 2022-03-29: qty 5

## 2022-03-29 MED ORDER — FAMOTIDINE 20 MG PO TABS: 10.0000 mg | ORAL_TABLET | Freq: Every evening | ORAL | Status: DC | PRN

## 2022-03-29 MED ORDER — LEVETIRACETAM IN NACL 1000 MG/100ML IV SOLN
1000.0000 mg | Freq: Two times a day (BID) | INTRAVENOUS | Status: AC
  Administered 2022-03-29: 1000 mg via INTRAVENOUS
  Filled 2022-03-29: qty 100

## 2022-03-29 SURGICAL SUPPLY — 82 items
BAG COUNTER SPONGE SURGICOUNT (BAG) ×1 IMPLANT
BAG SPNG CNTER NS LX DISP (BAG) ×1
BAND INSRT 18 STRL LF DISP RB (MISCELLANEOUS) ×2
BAND RUBBER #18 3X1/16 STRL (MISCELLANEOUS) ×2 IMPLANT
BIT DRILL WIRE PASS 1.3MM (BIT) IMPLANT
BLADE CLIPPER SPEC (BLADE) IMPLANT
BLADE ULTRA TIP 2M (BLADE) IMPLANT
BUR PRECISION FLUTE 6.0 (BURR) ×1 IMPLANT
BUR SPIRAL ROUTER 2.3 (BUR) IMPLANT
CANISTER SUCT 3000ML PPV (MISCELLANEOUS) ×2 IMPLANT
CASSETTE SUCT IRRIG SONOPET IQ (MISCELLANEOUS) IMPLANT
CATH VENTRIC 35X38 W/TROCAR LG (CATHETERS) IMPLANT
CLIP VESOCCLUDE MED 6/CT (CLIP) IMPLANT
CNTNR URN SCR LID CUP LEK RST (MISCELLANEOUS) ×1 IMPLANT
CONT SPEC 4OZ STRL OR WHT (MISCELLANEOUS) ×2
COVER BACK TABLE 60X90IN (DRAPES) IMPLANT
COVERAGE SUPPORT O-ARM STEALTH (MISCELLANEOUS) ×1 IMPLANT
DRAPE MICROSCOPE SLANT 54X150 (MISCELLANEOUS) ×1 IMPLANT
DRAPE NEUROLOGICAL W/INCISE (DRAPES) ×1 IMPLANT
DRAPE SURG 17X23 STRL (DRAPES) IMPLANT
DRAPE WARM FLUID 44X44 (DRAPES) ×1 IMPLANT
DRILL WIRE PASS 1.3MM (BIT)
ELECT REM PT RETURN 9FT ADLT (ELECTROSURGICAL) ×1
ELECTRODE REM PT RTRN 9FT ADLT (ELECTROSURGICAL) ×1 IMPLANT
EVACUATOR 1/8 PVC DRAIN (DRAIN) IMPLANT
EVACUATOR SILICONE 100CC (DRAIN) IMPLANT
FEE COVERAGE SUPPORT O-ARM (MISCELLANEOUS) ×1 IMPLANT
GAUZE 4X4 16PLY ~~LOC~~+RFID DBL (SPONGE) IMPLANT
GAUZE SPONGE 4X4 12PLY STRL (GAUZE/BANDAGES/DRESSINGS) IMPLANT
GLOVE BIO SURGEON STRL SZ 6.5 (GLOVE) ×1 IMPLANT
GLOVE BIO SURGEON STRL SZ8 (GLOVE) ×2 IMPLANT
GLOVE BIO SURGEON STRL SZ8.5 (GLOVE) ×2 IMPLANT
GLOVE BIOGEL M 7.0 STRL (GLOVE) IMPLANT
GLOVE BIOGEL PI IND STRL 6.5 (GLOVE) ×1 IMPLANT
GLOVE EXAM NITRILE XL STR (GLOVE) IMPLANT
GLOVE INDICATOR 7.5 STRL GRN (GLOVE) IMPLANT
GOWN STRL REUS W/ TWL LRG LVL3 (GOWN DISPOSABLE) IMPLANT
GOWN STRL REUS W/ TWL XL LVL3 (GOWN DISPOSABLE) ×1 IMPLANT
GOWN STRL REUS W/TWL LRG LVL3 (GOWN DISPOSABLE) ×2
GOWN STRL REUS W/TWL XL LVL3 (GOWN DISPOSABLE) ×1
HEMOSTAT POWDER KIT SURGIFOAM (HEMOSTASIS) ×1 IMPLANT
HEMOSTAT SURGICEL 2X14 (HEMOSTASIS) ×1 IMPLANT
KIT BASIN OR (CUSTOM PROCEDURE TRAY) ×1 IMPLANT
KIT DRAIN CSF ACCUDRAIN (MISCELLANEOUS) IMPLANT
KIT TURNOVER KIT B (KITS) ×1 IMPLANT
MARKER SKIN DUAL TIP RULER LAB (MISCELLANEOUS) IMPLANT
MARKER SPHERE PSV REFLC NDI (MISCELLANEOUS) IMPLANT
NEEDLE HYPO 22GX1.5 SAFETY (NEEDLE) ×1 IMPLANT
NS IRRIG 1000ML POUR BTL (IV SOLUTION) ×1 IMPLANT
PACK BATTERY CMF DISP FOR DVR (ORTHOPEDIC DISPOSABLE SUPPLIES) ×1 IMPLANT
PACK CRANIOTOMY CUSTOM (CUSTOM PROCEDURE TRAY) ×1 IMPLANT
PAD ARMBOARD 7.5X6 YLW CONV (MISCELLANEOUS) ×1 IMPLANT
PATTIES SURGICAL .25X.25 (GAUZE/BANDAGES/DRESSINGS) IMPLANT
PATTIES SURGICAL .5 X.5 (GAUZE/BANDAGES/DRESSINGS) IMPLANT
PATTIES SURGICAL .5 X3 (DISPOSABLE) IMPLANT
PATTIES SURGICAL 1X1 (DISPOSABLE) IMPLANT
PIN MAYFIELD SKULL DISP (PIN) ×1 IMPLANT
PLATE BONE 12 2H TARGET XL (Plate) IMPLANT
SCREW UNIII AXS SD 1.5X4 (Screw) IMPLANT
SPECIMEN JAR SMALL (MISCELLANEOUS) IMPLANT
SPONGE NEURO XRAY DETECT 1X3 (DISPOSABLE) IMPLANT
SPONGE SURGIFOAM ABS GEL 100 (HEMOSTASIS) IMPLANT
SPONGE SURGIFOAM ABS GEL SZ50 (HEMOSTASIS) IMPLANT
STAPLER SKIN PROX WIDE 3.9 (STAPLE) ×1 IMPLANT
STOCKINETTE 6  STRL (DRAPES)
STOCKINETTE 6 STRL (DRAPES) IMPLANT
SUT ETHILON 3 0 FSL (SUTURE) IMPLANT
SUT ETHILON 3 0 PS 1 (SUTURE) IMPLANT
SUT NURALON 4 0 TR CR/8 (SUTURE) ×2 IMPLANT
SUT PROLENE 6 0 BV (SUTURE) IMPLANT
SUT SILK 0 TIES 10X30 (SUTURE) IMPLANT
SUT VIC AB 2-0 CP2 18 (SUTURE) ×1 IMPLANT
SUT VIC AB 3-0 FS2 27 (SUTURE) IMPLANT
SUT VICRYL 4-0 PS2 18IN ABS (SUTURE) IMPLANT
TAPE CLOTH 4X10 WHT NS (GAUZE/BANDAGES/DRESSINGS) IMPLANT
TIP TISSUE SONOPET IQ STD 12 (TIP) IMPLANT
TOWEL GREEN STERILE (TOWEL DISPOSABLE) ×1 IMPLANT
TOWEL GREEN STERILE FF (TOWEL DISPOSABLE) ×1 IMPLANT
TRAY FOLEY MTR SLVR 16FR STAT (SET/KITS/TRAYS/PACK) ×1 IMPLANT
TUBE CONNECTING 12X1/4 (SUCTIONS) IMPLANT
UNDERPAD 30X36 HEAVY ABSORB (UNDERPADS AND DIAPERS) IMPLANT
WATER STERILE IRR 1000ML POUR (IV SOLUTION) ×1 IMPLANT

## 2022-03-29 NOTE — Anesthesia Postprocedure Evaluation (Signed)
Anesthesia Post Note  Patient: CHEYNNE VIRDEN  Procedure(s) Performed: CRANIOTOMY TUMOR EXCISION (Right) APPLICATION OF CRANIAL NAVIGATION (Right)     Patient location during evaluation: PACU Anesthesia Type: General Level of consciousness: awake and alert Pain management: pain level controlled Vital Signs Assessment: post-procedure vital signs reviewed and stable Respiratory status: spontaneous breathing, nonlabored ventilation, respiratory function stable and patient connected to nasal cannula oxygen Cardiovascular status: blood pressure returned to baseline, stable and tachycardic Postop Assessment: no apparent nausea or vomiting Anesthetic complications: no   No notable events documented.  Last Vitals:  Vitals:   03/29/22 1945 03/29/22 2000  BP: 133/60 137/65  Pulse: (!) 113 (!) 109  Resp: 20 19  Temp:    SpO2: 91% 92%    Last Pain:  Vitals:   03/29/22 1930  PainSc: 0-No pain                 Santa Lighter

## 2022-03-29 NOTE — Anesthesia Procedure Notes (Signed)
Procedure Name: Intubation Date/Time: 03/29/2022 4:09 PM  Performed by: Dorann Lodge, CRNAPre-anesthesia Checklist: Patient identified, Emergency Drugs available, Suction available and Patient being monitored Patient Re-evaluated:Patient Re-evaluated prior to induction Oxygen Delivery Method: Circle system utilized Preoxygenation: Pre-oxygenation with 100% oxygen Induction Type: IV induction Ventilation: Mask ventilation without difficulty Laryngoscope Size: 3 and Mac Grade View: Grade I Tube type: Oral Tube size: 7.0 mm Number of attempts: 1 Airway Equipment and Method: Stylet and Oral airway Placement Confirmation: ETT inserted through vocal cords under direct vision, positive ETCO2 and breath sounds checked- equal and bilateral Tube secured with: Tape Dental Injury: Teeth and Oropharynx as per pre-operative assessment

## 2022-03-29 NOTE — Op Note (Signed)
Brief history: The patient is an 87 year old white female who presented with syncope.  She was worked up with a head CT and brain MRI which demonstrated a right temporal brain tumor.  I discussed the various treatment options with her.  She has decided proceed with surgery.  Preop diagnosis: Right posterior temporal brain tumor  Postop diagnosis: The same  Procedure: Right temporal craniotomy for gross total resection of right posterior temporal brain tumor : Application of cranial navigation  Surgeon: Dr. Earle Gell  Assistant: Arnetha Massy, NP  Anesthesia: General tracheal  Estimated blood loss: 150 cc  Specimens: Tumor  Drains: None  Complications: None  Description of procedure: The patient was brought to the operating room by the anesthesia team.  General tracheal anesthesia was induced.  I then applied the Mayfield three-point headrest to the patient's calvarium.  A roll was placed on her right shoulder her head was turned to the left exposing her right scalp.  We then entered the surface accordance into the Stealth cranial navigation unit.  I then shaved her scalp with the clippers and her right scalp was then prepared with Betadine scrub and Betadine solution.  Sterile drapes were applied.  I then injected the area to be incised with Marcaine with epinephrine solution.  I used a scalpel to make a inverted U type incision and right temporal region.  We used Raney clips for wound edge hemostasis.  We then exposed the underlying calvarium with the periosteal elevator and electrocautery.  We used towel clamp and rubber bands for exposure.  We also used the Buddy halo for exposure during a tumor resection.  I used a high-speed drill to create 2 temporal bur holes.  I then used the footplate device to create a right temporal craniotomy flap.  We elevated the craniotomy flap with the Penfield #1 exposing the underlying dura.  We then incised the dura with a 15 blade scalpel and the  Metzenbaum scissors tacking up the dural edges.  We then confirmed the good placement of the craniotomy using the Stealth cranial navigation.  We brought the operative microscope into the field and under specification illumination we completed the microdissection.  I used bipolar cautery to create a corticotomy over the tumor.  I used suction and bipolar cautery to dissect deeper and we encountered the tumor as expected.  It appeared to be primary tumor.  We obtained multiple specimens for the pathologist.  The sono PET to internally debulk the tumor.  This gave [place] a working room.  I was then able to use bipolar cautery and suction to circumscribe the tumor.  The tumor was moderately vascular.  We obtained a gross total resection using suction, bipolar cautery and the sono PET.  We confirmed the resection using the Stealth cranial navigation.  We then obtained hemostasis using Avitene and bipolar cautery.  We then irrigated the resection cavity out with saline solution and then lined the tumor resection cavity with Surgicel.  We then reapproximated the patient's dura with interrupted 4-0 Nurolon suture.  We reapproximated the galea with interrupted 2-0 Vicryl suture.  We reapproximated the skin with stainless steel staples.  The wound was then coated with bacitracin ointment.  A sterile dressing was applied.  The drapes were removed.  I then remove the Mayfield three-point headrest from the patient's calvarium.  By report all sponge, instrument, and needle counts were correct at the end of this case.

## 2022-03-29 NOTE — Anesthesia Procedure Notes (Signed)
Arterial Line Insertion Start/End10/23/2023 2:00 PM, 03/29/2022 2:08 PM Performed by: Colin Benton, CRNA, CRNA  Patient location: Pre-op. Preanesthetic checklist: patient identified, IV checked, site marked, risks and benefits discussed, surgical consent, monitors and equipment checked, pre-op evaluation, timeout performed and anesthesia consent Lidocaine 1% used for infiltration Left, radial was placed Catheter size: 20 G Hand hygiene performed , maximum sterile barriers used  and Seldinger technique used Allen's test indicative of satisfactory collateral circulation Attempts: 1 Procedure performed without using ultrasound guided technique. Following insertion, dressing applied and Biopatch. Post procedure assessment: normal and unchanged  Patient tolerated the procedure well with no immediate complications.

## 2022-03-29 NOTE — H&P (Signed)
Subjective: The patient is an 87 year old white female who had a syncopal event.  She was worked up with a head CT which demonstrated a right brain lesion.  This was followed up with a brain MRI which demonstrated a right posterior temporal brain tumor.  I discussed the various treatment options with her.  She has decided proceed with surgery.  Past Medical History:  Diagnosis Date   Arthritis    GERD (gastroesophageal reflux disease)    indigestion   History of blood transfusion    as an infant   History of hiatal hernia    History of pneumonia    Hyperlipidemia    Hypertension    Renal cyst     Past Surgical History:  Procedure Laterality Date   COLONOSCOPY     DILATION AND CURETTAGE OF UTERUS     x2   EYE SURGERY     cataracts   HIP SURGERY  5/05   right hip replacement   HIP SURGERY  5/11   left hip replacement   ORIF HUMERUS FRACTURE Left 11/11/2017   Procedure: OPEN REDUCTION INTERNAL FIXATION (ORIF) LEFT DISTAL HUMERUS FRACTURE;  Surgeon: Shona Needles, MD;  Location: [facility];  Service: Orthopedics;  Laterality: Left;   RHINOPLASTY     TONSILLECTOMY     TOTAL SHOULDER ARTHROPLASTY Left 04/01/2016   Procedure: LEFT TOTAL SHOULDER ARTHROPLASTY;  Surgeon: Justice Britain, MD;  Location: [facility];  Service: Orthopedics;  Laterality: Left;    No Known Allergies  Social History   Tobacco Use   Smoking status: Never   Smokeless tobacco: Never  Substance Use Topics   Alcohol use: No    Family History  Problem Relation Age of Onset   Heart disease Mother    Prior to Admission medications   Medication Sig Start Date End Date Taking? Authorizing Provider  acetaminophen (TYLENOL) 650 MG CR tablet Take 1,300 mg by mouth in the morning, at noon, and at bedtime.   Yes [provider]  amLODipine (NORVASC) 5 MG tablet Take 5 mg by mouth 2 (two) times daily.  01/12/16  Yes [provider]  atenolol (TENORMIN) 50 MG tablet Take 50 mg by mouth at bedtime.   Yes  [provider]  atorvastatin (LIPITOR) 10 MG tablet Take 10 mg by mouth daily at 6 PM.    Yes [provider]  cholecalciferol (VITAMIN D3) 25 MCG (1000 UNIT) tablet Take 1,000 Units by mouth daily.   Yes [provider]  famotidine (PEPCID) 10 MG tablet Take 10 mg by mouth at bedtime as needed for heartburn or indigestion.   Yes [provider]  magnesium gluconate (MAGONATE) 500 MG tablet Take 500 mg by mouth at bedtime.   Yes [provider]  multivitamin-lutein (OCUVITE-LUTEIN) CAPS capsule Take 1 capsule by mouth in the morning and at bedtime.   Yes [provider]  Potassium 99 MG TABS Take 99 mg by mouth at bedtime.   Yes [provider]     Review of Systems  Positive ROS: As above  All other systems have been reviewed and were otherwise negative with the exception of those mentioned in the HPI and as above.  Objective: Vital signs in last 24 hours: Temp:  [98.2 F (36.8 C)] 98.2 F (36.8 C) (10/23 1311) Pulse Rate:  [97] 97 (10/23 1311) Resp:  [17] 17 (10/23 1311) BP: (152)/(62) 152/62 (10/23 1311) SpO2:  [94 %] 94 % (10/23 1311) Weight:  [73.9 kg] 73.9  kg (10/23 1311) Estimated body mass index is 29.81 kg/m as calculated from the following:   Height as of this encounter: '5\' 2"'$  (1.575 m).   Weight as of this encounter: 73.9 kg.   General Appearance: Alert Head: Normocephalic, without obvious abnormality, atraumatic Eyes: PERRL, conjunctiva/corneas clear, EOM's intact,    Ears: Normal  Throat: Normal  Neck: Supple, Back: unremarkable Lungs: Clear to auscultation bilaterally, respirations unlabored Heart: Regular rate and rhythm, no murmur, rub or gallop Abdomen: Soft, non-tender Extremities: Extremities normal, atraumatic, no cyanosis or edema Skin: unremarkable  NEUROLOGIC:   Mental status: alert and oriented,Motor Exam - grossly normal Sensory Exam - grossly normal Reflexes:  Coordination -  grossly normal Gait - grossly normal Balance - grossly normal Cranial Nerves: I: smell Not tested  II: visual acuity  OS: Normal  OD: Normal   II: visual fields Full to confrontation  II: pupils Equal, round, reactive to light  III,VII: ptosis None  III,IV,VI: extraocular muscles  Full ROM  V: mastication Normal  V: facial light touch sensation  Normal  V,VII: corneal reflex  Present  VII: facial muscle function - upper  Normal  VII: facial muscle function - lower Normal  VIII: hearing Not tested  IX: soft palate elevation  Normal  IX,X: gag reflex Present  XI: trapezius strength  5/5  XI: sternocleidomastoid strength 5/5  XI: neck flexion strength  5/5  XII: tongue strength  Normal    Data Review Lab Results  Component Value Date   WBC 13.8 (H) 03/24/2022   HGB 12.8 03/24/2022   HCT 40.4 03/24/2022   MCV 98.5 03/24/2022   PLT 267 03/24/2022   Lab Results  Component Value Date   NA 139 03/24/2022   K 3.6 03/24/2022   CL 107 03/24/2022   CO2 23 03/24/2022   BUN 23 03/24/2022   CREATININE 0.90 03/24/2022   GLUCOSE 101 (H) 03/24/2022   Lab Results  Component Value Date   INR 1.01 11/11/2017    Assessment/Plan: Right brain tumor: I have discussed the situation with the patient and her son and daughter.  We have discussed the various treatment options including surgery.  I have described the surgical treatment option of a right craniotomy for resection of her tumor.  I have discussed the risk, benefits, alternatives, expected postoperative course, and likelihood of achieving our goals with surgery.  I have answered all her questions.  She has decided proceed with surgery.   Ophelia Charter 03/29/2022 3:49 PM

## 2022-03-29 NOTE — Anesthesia Preprocedure Evaluation (Addendum)
Anesthesia Evaluation  Patient identified by MRN, date of birth, ID band Patient awake    Reviewed: Allergy & Precautions, H&P , NPO status , Patient's Chart, lab work & pertinent test results, reviewed documented beta blocker date and time   Airway Mallampati: III  TM Distance: >3 FB Neck ROM: Full    Dental no notable dental hx. (+) Teeth Intact, Dental Advisory Given   Pulmonary neg pulmonary ROS   Pulmonary exam normal breath sounds clear to auscultation       Cardiovascular hypertension, Pt. on medications and Pt. on home beta blockers  Rhythm:Regular Rate:Normal     Neuro/Psych negative neurological ROS  negative psych ROS   GI/Hepatic Neg liver ROS, hiatal hernia,GERD  Medicated,,  Endo/Other  negative endocrine ROS    Renal/GU negative Renal ROS  negative genitourinary   Musculoskeletal  (+) Arthritis , Osteoarthritis,    Abdominal   Peds  Hematology negative hematology ROS (+)   Anesthesia Other Findings   Reproductive/Obstetrics negative OB ROS                             Anesthesia Physical Anesthesia Plan  ASA: 3  Anesthesia Plan: General   Post-op Pain Management: Tylenol PO (pre-op)*   Induction: Intravenous  PONV Risk Score and Plan: 4 or greater and Ondansetron, Dexamethasone and Treatment may vary due to age or medical condition  Airway Management Planned: Oral ETT  Additional Equipment: Arterial line  Intra-op Plan:   Post-operative Plan: Extubation in OR  Informed Consent: I have reviewed the patients History and Physical, chart, labs and discussed the procedure including the risks, benefits and alternatives for the proposed anesthesia with the patient or authorized representative who has indicated his/her understanding and acceptance.     Dental advisory given  Plan Discussed with: CRNA  Anesthesia Plan Comments:         Anesthesia Quick  Evaluation

## 2022-03-29 NOTE — Transfer of Care (Signed)
Immediate Anesthesia Transfer of Care Note  Patient: Lauren Hill  Procedure(s) Performed: CRANIOTOMY TUMOR EXCISION (Right) APPLICATION OF CRANIAL NAVIGATION (Right)  Patient Location: PACU  Anesthesia Type:General  Level of Consciousness: awake, alert , and oriented  Airway & Oxygen Therapy: Patient Spontanous Breathing and Patient connected to face mask oxygen  Post-op Assessment: Report given to RN and Post -op Vital signs reviewed and stable  Post vital signs: Reviewed and stable  Last Vitals:  Vitals Value Taken Time  BP 133/55   Temp    Pulse 121   Resp 17   SpO2 100     Last Pain: There were no vitals filed for this visit.       Complications: No notable events documented.

## 2022-03-29 NOTE — Progress Notes (Signed)
Subjective: The patient is alert and pleasant.  She looks well.  Objective: Vital signs in last 24 hours: Temp:  [98.2 F (36.8 C)] 98.2 F (36.8 C) (10/23 1311) Pulse Rate:  [97] 97 (10/23 1311) Resp:  [17] 17 (10/23 1311) BP: (152)/(62) 152/62 (10/23 1311) SpO2:  [94 %] 94 % (10/23 1311) Weight:  [73.9 kg] 73.9 kg (10/23 1311) Estimated body mass index is 29.81 kg/m as calculated from the following:   Height as of this encounter: '5\' 2"'$  (1.575 m).   Weight as of this encounter: 73.9 kg.   Intake/Output from previous day: No intake/output data recorded. Intake/Output this shift: Total I/O In: 100 [I.V.:100] Out: 175 [Urine:175]  Physical exam the patient is alert and pleasant.  Her speech and strength is normal.  Lab Results: No results for input(s): "WBC", "HGB", "HCT", "PLT" in the last 72 hours. BMET No results for input(s): "NA", "K", "CL", "CO2", "GLUCOSE", "BUN", "CREATININE", "CALCIUM" in the last 72 hours.  Studies/Results: No results found.  Assessment/Plan: The patient is doing well.  LOS: 0 days     Ophelia Charter 03/29/2022, 7:33 PM

## 2022-03-30 ENCOUNTER — Inpatient Hospital Stay (HOSPITAL_COMMUNITY): Payer: PPO

## 2022-03-30 MED ORDER — GADOBUTROL 1 MMOL/ML IV SOLN
7.0000 mL | Freq: Once | INTRAVENOUS | Status: AC | PRN
  Administered 2022-03-30: 7 mL via INTRAVENOUS

## 2022-03-30 MED FILL — Thrombin For Soln 5000 Unit: CUTANEOUS | Qty: 2 | Status: AC

## 2022-03-30 NOTE — Progress Notes (Signed)
Subjective: The patient is alert and pleasant.  She looks and feels well.  Objective: Vital signs in last 24 hours: Temp:  [97.3 F (36.3 C)-99.1 F (37.3 C)] 97.8 F (36.6 C) (10/24 0400) Pulse Rate:  [75-122] 89 (10/24 0700) Resp:  [15-28] 19 (10/24 0700) BP: (97-152)/(47-100) 115/100 (10/24 0700) SpO2:  [88 %-95 %] 92 % (10/24 0700) Arterial Line BP: (117-145)/(53-66) 117/61 (10/24 0700) Weight:  [73.9 kg] 73.9 kg (10/23 1311) Estimated body mass index is 29.81 kg/m as calculated from the following:   Height as of this encounter: '5\' 2"'$  (1.575 m).   Weight as of this encounter: 73.9 kg.   Intake/Output from previous day: 10/23 0701 - 10/24 0700 In: 3161.7 [P.O.:200; I.V.:2161.7; IV Piggyback:800] Out: 2700 [Urine:2500; Blood:200] Intake/Output this shift: No intake/output data recorded.  Physical exam the patient is alert and pleasant.  She looks great.  Her speech and strength is normal.  Her dressing is clean and dry.  Lab Results: Recent Labs    03/29/22 1655  HGB 11.2*  HCT 33.0*   BMET Recent Labs    03/29/22 1655  NA 137  K 3.1*    Studies/Results: No results found.  Assessment/Plan: Postoperative day #1: The patient is doing great clinically.  I will transfer her to progressive and mobilize the patient.  She will likely go home tomorrow.  We will get a postoperative brain MRI to judge the extent of resection and to use as a baseline to monitor the progression of further treatments.  LOS: 1 day     Ophelia Charter 03/30/2022, 7:59 AM

## 2022-03-30 NOTE — Progress Notes (Signed)
Pt's foley taken out at 1783 w/o complication.

## 2022-03-30 NOTE — Plan of Care (Signed)
  Problem: Education: Goal: Knowledge of the prescribed therapeutic regimen will improve Outcome: Progressing   Problem: Clinical Measurements: Goal: Usual level of consciousness will be regained or maintained. Outcome: Progressing Goal: Neurologic status will improve Outcome: Progressing Goal: Ability to maintain intracranial pressure will improve Outcome: Progressing   Problem: Skin Integrity: Goal: Demonstration of wound healing without infection will improve Outcome: Progressing

## 2022-03-31 ENCOUNTER — Encounter (HOSPITAL_COMMUNITY): Payer: Self-pay | Admitting: Neurosurgery

## 2022-03-31 MED ORDER — LEVETIRACETAM 500 MG PO TABS: 500.0000 mg | ORAL_TABLET | Freq: Two times a day (BID) | ORAL | 0 refills | Status: DC

## 2022-03-31 MED ORDER — DEXAMETHASONE 0.5 MG PO TABS: 2.0000 mg | ORAL_TABLET | Freq: Three times a day (TID) | ORAL | Status: DC

## 2022-03-31 MED ORDER — DEXAMETHASONE 2 MG PO TABS: 2.0000 mg | ORAL_TABLET | Freq: Three times a day (TID) | ORAL | 0 refills | Status: DC

## 2022-03-31 MED ORDER — LEVETIRACETAM 500 MG PO TABS: 500.0000 mg | ORAL_TABLET | Freq: Two times a day (BID) | ORAL | Status: DC

## 2022-03-31 NOTE — Discharge Summary (Signed)
Physician Discharge Summary  Patient ID: Lauren Hill MRN: [id number] DOB/AGE: Oct 12, 1934 87 y.o.  Admit date: 03/29/2022 Discharge date: 03/31/2022  Admission Diagnoses: Right temporal brain tumor  Discharge Diagnoses: The same Principal Problem:   Status post craniotomy Active Problems:   Brain tumor [facility])   Discharged Condition: good  Hospital Course: I performed a right temporal craniotomy for resection of a brain tumor on 03/29/2022.  The surgery went well.  The patient's postoperative course was unremarkable.  A follow-up head CT was obtained yesterday which demonstrated tumor resection with likely small amount of residual tumor.  On postoperative day #2 the patient requested discharge to home.  The patient, her daughter, and sister were given verbal and written discharge instructions.  At the time of her discharge the pathology is still pending.  She was instructed to follow-up with Dr. Mickeal Skinner for further treatments follow-up with me in 10 days for staple removal.  All their questions were answered.  Consults: PT, OT, care management Significant Diagnostic Studies: Brain MRI Treatments: Right temporal craniotomy for resection of brain tumor using microdissection and neuro navigation Discharge Exam: Blood pressure (!) 122/56, pulse 78, temperature 98.2 F (36.8 C), temperature source Oral, resp. rate 15, height '5\' 2"'$  (1.575 m), weight 73.9 kg, last menstrual period 06/08/1983, SpO2 93 %. Patient is alert and pleasant.  She looks well.  Her speech and strength is normal.  Her dressing is clean and dry.  Disposition: Home  Discharge Instructions     Call MD for:  difficulty breathing, headache or visual disturbances   Complete by: As directed    Call MD for:  extreme fatigue   Complete by: As directed    Call MD for:  hives   Complete by: As directed    Call MD for:  persistant dizziness or light-headedness   Complete by: As directed    Call MD for:  persistant  nausea and vomiting   Complete by: As directed    Call MD for:  redness, tenderness, or signs of infection (pain, swelling, redness, odor or green/yellow discharge around incision site)   Complete by: As directed    Call MD for:  severe uncontrolled pain   Complete by: As directed    Call MD for:  temperature >100.4   Complete by: As directed    Diet - low sodium heart healthy   Complete by: As directed    Discharge instructions   Complete by: As directed    Call [phone] for a followup appointment. Take a stool softener while you are using pain medications.   Driving Restrictions   Complete by: As directed    Do not drive for 2 weeks.   Increase activity slowly   Complete by: As directed    Lifting restrictions   Complete by: As directed    Do not lift more than 5 pounds. No excessive bending or twisting.   May shower / Bathe   Complete by: As directed    Remove the dressing for 3 days after surgery.  You may shower, but leave the incision alone.   Remove dressing in 24 hours   Complete by: As directed       Allergies as of 03/31/2022   No Known Allergies      Medication List     TAKE these medications    acetaminophen 650 MG CR tablet Commonly known as: TYLENOL Take 1,300 mg by mouth in the morning, at noon, and at bedtime.   amLODipine  5 MG tablet Commonly known as: NORVASC Take 5 mg by mouth 2 (two) times daily.   atenolol 50 MG tablet Commonly known as: TENORMIN Take 50 mg by mouth at bedtime.   atorvastatin 10 MG tablet Commonly known as: LIPITOR Take 10 mg by mouth daily at 6 PM.   cholecalciferol 25 MCG (1000 UNIT) tablet Commonly known as: VITAMIN D3 Take 1,000 Units by mouth daily.   dexamethasone 2 MG tablet Commonly known as: DECADRON Take 1 tablet (2 mg total) by mouth every 8 (eight) hours.   famotidine 10 MG tablet Commonly known as: PEPCID Take 10 mg by mouth at bedtime as needed for heartburn or indigestion.   levETIRAcetam 500  MG tablet Commonly known as: KEPPRA Take 1 tablet (500 mg total) by mouth 2 (two) times daily.   magnesium gluconate 500 MG tablet Commonly known as: MAGONATE Take 500 mg by mouth at bedtime.   multivitamin-lutein Caps capsule Take 1 capsule by mouth in the morning and at bedtime.   Potassium 99 MG Tabs Take 99 mg by mouth at bedtime.         Signed: Ophelia Charter 03/31/2022, 11:00 AM

## 2022-03-31 NOTE — Progress Notes (Signed)
Discharge orders reviewed with the patient and her family. All questions answered. Patient excited about going home. She is being Discharged to home.

## 2022-04-05 ENCOUNTER — Inpatient Hospital Stay: Payer: PPO

## 2022-04-05 ENCOUNTER — Telehealth: Payer: Self-pay | Admitting: Internal Medicine

## 2022-04-05 NOTE — Telephone Encounter (Signed)
Scheduled appt per 10/30 staff msg. Per MD, okay to schedule without a referral. Spoke to pt's daughter who is aware of appt date/time.

## 2022-04-06 ENCOUNTER — Telehealth: Payer: Self-pay | Admitting: Radiation Therapy

## 2022-04-06 NOTE — Telephone Encounter (Signed)
Spoke with pt's daughter about adding a visit for Lauren Hill to see Dr. Lisbeth Renshaw while she is here on Thursday 11/2.   Mont Dutton R.T.(R)(T) Radiation Special Procedures Navigator

## 2022-04-07 NOTE — Progress Notes (Signed)
Location/Histology of Brain Tumor: Right Posterior Temporal Brain  Patient presented after a syncopal event.  She was worked up with a head CT which demonstrated a right brain lesion.  MRI Brain 03/30/2022-Postop: Status post right parietal craniotomy and resection of a previously noted posterior right temporal lobe mass. Enhancement about the resection cavity may be postsurgical but is concerning for residual tumor.  CT CAP 03/24/2022: No evidence of primary or metastatic disease in the chest, abdomen or pelvis.  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  CT Head 03/23/2022:New subcortical hypodensity in the RIGHT temporoparietal lobe representing either a white matter infarction or mass lesion.  Recommend MRI brain without and with contrast for further characterization.  No midline shift or mass effect.  No intraparenchymal hemorrhage.   Past or anticipated interventions, if any, per neurosurgery:  Dr. Arnoldo Morale -Craniotomy tumor excision 03/29/2022   Past or anticipated interventions, if any, per medical oncology:  Dr. Mickeal Skinner 04/08/2022   Dose of Decadron, if applicable: {:16244}  Recent neurologic symptoms, if any:  Seizures: {:18581} Headaches: {:18581} Nausea: {:18581} Dizziness/ataxia: {:18581} Difficulty with hand coordination: {:18581} Focal numbness/weakness: {:18581} Visual deficits/changes: {:18581} Confusion/Memory deficits: {:18581}    SAFETY ISSUES: Prior radiation?  Pacemaker/ICD?  Possible current pregnancy? Postmenopausal Is the patient on methotrexate?   Additional Complaints / other details:

## 2022-04-08 ENCOUNTER — Other Ambulatory Visit: Payer: Self-pay

## 2022-04-08 ENCOUNTER — Ambulatory Visit: Payer: PPO

## 2022-04-08 ENCOUNTER — Inpatient Hospital Stay: Payer: PPO | Attending: Internal Medicine | Admitting: Internal Medicine

## 2022-04-08 ENCOUNTER — Ambulatory Visit
Admission: RE | Admit: 2022-04-08 | Discharge: 2022-04-08 | Disposition: A | Discharge status: Home / Self Care | Payer: PPO | Source: Ambulatory Visit | Attending: Radiation Oncology | Admitting: Radiation Oncology

## 2022-04-08 VITALS — BP 127/89 | HR 78 | Temp 97.8°F | Resp 16 | Ht 62.0 in | Wt 165.9 lb

## 2022-04-08 DIAGNOSIS — K8020 Calculus of gallbladder without cholecystitis without obstruction: Secondary | ICD-10-CM | POA: Insufficient documentation

## 2022-04-08 DIAGNOSIS — N3289 Other specified disorders of bladder: Secondary | ICD-10-CM | POA: Insufficient documentation

## 2022-04-08 DIAGNOSIS — E785 Hyperlipidemia, unspecified: Secondary | ICD-10-CM | POA: Insufficient documentation

## 2022-04-08 DIAGNOSIS — Z7952 Long term (current) use of systemic steroids: Secondary | ICD-10-CM | POA: Insufficient documentation

## 2022-04-08 DIAGNOSIS — Z79899 Other long term (current) drug therapy: Secondary | ICD-10-CM | POA: Insufficient documentation

## 2022-04-08 DIAGNOSIS — M47814 Spondylosis without myelopathy or radiculopathy, thoracic region: Secondary | ICD-10-CM | POA: Diagnosis not present

## 2022-04-08 DIAGNOSIS — C719 Malignant neoplasm of brain, unspecified: Secondary | ICD-10-CM

## 2022-04-08 DIAGNOSIS — I10 Essential (primary) hypertension: Secondary | ICD-10-CM | POA: Insufficient documentation

## 2022-04-08 DIAGNOSIS — K5730 Diverticulosis of large intestine without perforation or abscess without bleeding: Secondary | ICD-10-CM | POA: Insufficient documentation

## 2022-04-08 DIAGNOSIS — I083 Combined rheumatic disorders of mitral, aortic and tricuspid valves: Secondary | ICD-10-CM | POA: Insufficient documentation

## 2022-04-08 DIAGNOSIS — C712 Malignant neoplasm of temporal lobe: Secondary | ICD-10-CM | POA: Insufficient documentation

## 2022-04-08 DIAGNOSIS — K449 Diaphragmatic hernia without obstruction or gangrene: Secondary | ICD-10-CM | POA: Insufficient documentation

## 2022-04-08 DIAGNOSIS — M47816 Spondylosis without myelopathy or radiculopathy, lumbar region: Secondary | ICD-10-CM | POA: Insufficient documentation

## 2022-04-08 DIAGNOSIS — K219 Gastro-esophageal reflux disease without esophagitis: Secondary | ICD-10-CM | POA: Insufficient documentation

## 2022-04-08 DIAGNOSIS — I700 Atherosclerosis of aorta: Secondary | ICD-10-CM | POA: Insufficient documentation

## 2022-04-08 DIAGNOSIS — R339 Retention of urine, unspecified: Secondary | ICD-10-CM | POA: Insufficient documentation

## 2022-04-08 NOTE — Progress Notes (Signed)
Radiation Oncology         [phone] ________________________________  Name: Lauren Hill        MRN: [id number]  Date of Service: 04/08/2022 DOB: 05/07/1935  BT:DVVOHYW, Lauren Lovett, MD  Lauren Hill Acey Lav, MD     REFERRING PHYSICIAN: Ventura Sellers, MD   DIAGNOSIS: The encounter diagnosis was Primary malignant glioma of temporal lobe [facility]).   HISTORY OF PRESENT ILLNESS: Lauren Hill is a 87 y.o. female seen at the request of Dr. 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 The patient presented with a syncopal event and was worked up with a CT of the head which showed a primary right temporal lobe mass.  An MRI of the brain on 03/24/2022 showed a 4 cm necrotic mass in the posterior right temporal lobe.  Systemic CT staging on 03/24/2022 was negative for a primary malignancy.  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  She underwent craniotomy for tumor excision on 03/29/2022 with Dr. Arnoldo Morale which showed high-grade glioma.  She had a postop MRI on 03/30/2022 which showed enhancement about the resection cavity that could be postoperative but was still concerning for some residual disease.  She is seen to discuss treatment of her cancer.   PREVIOUS RADIATION THERAPY: No   PAST MEDICAL HISTORY:  Past Medical History:  Diagnosis Date   Arthritis    GERD (gastroesophageal reflux disease)    indigestion   History of blood transfusion    as an infant   History of hiatal hernia    History of pneumonia    Hyperlipidemia    Hypertension    Renal cyst        PAST SURGICAL HISTORY: Past Surgical History:  Procedure Laterality Date   APPLICATION OF CRANIAL NAVIGATION Right 03/29/2022   Procedure: APPLICATION OF CRANIAL NAVIGATION;   Surgeon: Newman Pies, MD;  Location: [place];  Service: Neurosurgery;  Laterality: Right;   COLONOSCOPY     CRANIOTOMY Right 03/29/2022   Procedure: CRANIOTOMY TUMOR EXCISION;  Surgeon: Newman Pies, MD;  Location: [place];  Service: Neurosurgery;  Laterality: Right;   DILATION AND CURETTAGE OF UTERUS     x2   EYE SURGERY     cataracts   HIP SURGERY  5/05   right hip replacement   HIP SURGERY  5/11   left hip replacement   ORIF HUMERUS FRACTURE Left 11/11/2017   Procedure: OPEN REDUCTION INTERNAL FIXATION (ORIF) LEFT DISTAL HUMERUS FRACTURE;  Surgeon: Shona Needles, MD;  Location: [place];  Service: Orthopedics;  Laterality: Left;   RHINOPLASTY     TONSILLECTOMY     TOTAL SHOULDER ARTHROPLASTY Left 04/01/2016   Procedure: LEFT TOTAL SHOULDER ARTHROPLASTY;  Surgeon: Justice Britain, MD;  Location: [place];  Service: Orthopedics;  Laterality: Left;     FAMILY HISTORY:  Family History  Problem Relation Age of Onset   Heart disease Mother      SOCIAL HISTORY:  reports that she has never smoked. She has never used smokeless tobacco. She reports that she does not drink alcohol and does not use drugs.   ALLERGIES: Patient has no known allergies.   MEDICATIONS:  Current Outpatient Medications  Medication Sig Dispense Refill   acetaminophen (TYLENOL) 650 MG CR tablet Take 1,300 mg by mouth  in the morning, at noon, and at bedtime.     amLODipine (NORVASC) 5 MG tablet Take 5 mg by mouth 2 (two) times daily.      atenolol (TENORMIN) 50 MG tablet Take 50 mg by mouth at bedtime.     atorvastatin (LIPITOR) 10 MG tablet Take 10 mg by mouth daily at 6 PM.      cholecalciferol (VITAMIN D3) 25 MCG (1000 UNIT) tablet Take 1,000 Units by mouth daily.     dexamethasone (DECADRON) 2 MG tablet Take 1 tablet (2 mg total) by mouth every 8 (eight) hours. 90 tablet 0   famotidine (PEPCID) 10 MG tablet Take 10 mg by mouth at bedtime as needed for heartburn or indigestion.     levETIRAcetam (KEPPRA) 500  MG tablet Take 1 tablet (500 mg total) by mouth 2 (two) times daily. 60 tablet 0   magnesium gluconate (MAGONATE) 500 MG tablet Take 500 mg by mouth at bedtime.     multivitamin-lutein (OCUVITE-LUTEIN) CAPS capsule Take 1 capsule by mouth in the morning and at bedtime.     Potassium 99 MG TABS Take 99 mg by mouth at bedtime.     No current facility-administered medications for this visit.     REVIEW OF SYSTEMS: On review of systems, the patient reports that she is doing well overall. she denies any chest pain, shortness of breath, cough, fevers, chills, night sweats, unintended weight changes. she denies any bowel or bladder disturbances, and denies abdominal pain, nausea or vomiting. she denies any new musculoskeletal or joint aches or pains. A complete review of systems is obtained and is otherwise negative.     PHYSICAL EXAM:  Wt Readings from Last 3 Encounters:  03/29/22 163 lb (73.9 kg)  03/26/22 184 lb 4.9 oz (83.6 kg)  11/23/21 173 lb 12.8 oz (78.8 kg)   Temp Readings from Last 3 Encounters:  03/31/22 98.2 F (36.8 C) (Oral)  03/26/22 97.9 F (36.6 C) (Oral)  08/08/18 97.8 F (36.6 C) (Oral)   BP Readings from Last 3 Encounters:  03/31/22 (!) 122/56  03/26/22 130/64  11/23/21 (!) 155/76   Pulse Readings from Last 3 Encounters:  03/31/22 78  03/26/22 84  11/23/21 (!) 103     In general this is a well appearing female in no acute distress. she's alert and oriented x4 and appropriate throughout the examination. Cardiopulmonary assessment is negative for acute distress and she exhibits normal effort.     ECOG = 1  0 - Asymptomatic (Fully active, able to carry on all predisease activities without restriction)  1 - Symptomatic but completely ambulatory (Restricted in physically strenuous activity but ambulatory and able to carry out work of a light or sedentary nature. For example, light housework, office work)  2 - Symptomatic, <50% in bed during the day (Ambulatory  and capable of all self care but unable to carry out any work activities. Up and about more than 50% of waking hours)  3 - Symptomatic, >50% in bed, but not bedbound (Capable of only limited self-care, confined to bed or chair 50% or more of waking hours)  4 - Bedbound (Completely disabled. Cannot carry on any self-care. Totally confined to bed or chair)  5 - Death   Eustace Pen MM, Creech RH, Tormey DC, et al. [phone]). "Toxicity and response criteria of the [facility]". [place] Oncol. 5 (6): 649-55    LABORATORY DATA:  Lab Results  Component Value Date   WBC 13.8 (H)  03/24/2022   HGB 11.2 (L) 03/29/2022   HCT 33.0 (L) 03/29/2022   MCV 98.5 03/24/2022   PLT 267 03/24/2022   Lab Results  Component Value Date   NA 137 03/29/2022   K 3.1 (L) 03/29/2022   CL 107 03/24/2022   CO2 23 03/24/2022   Lab Results  Component Value Date   ALT 18 11/11/2017   AST 19 11/11/2017   ALKPHOS 73 11/11/2017   BILITOT 0.8 11/11/2017      RADIOGRAPHY: MR BRAIN W WO CONTRAST  Result Date: 03/31/2022 CLINICAL DATA:  Post craniotomy EXAM: MRI HEAD WITHOUT AND WITH CONTRAST TECHNIQUE: Multiplanar, multiecho pulse sequences of the brain and surrounding structures were obtained without and with intravenous contrast. CONTRAST:  8m GADAVIST GADOBUTROL 1 MMOL/ML IV SOLN COMPARISON:  03/24/2022 FINDINGS: Brain: Status post right parietal craniotomy and resection of a previously noted posterior right temporal lobe mass. Right posterior temporal resection cavity demonstrates internal fluid and peripheral intrinsic T1 hyperintense material, likely blood products. Focal enhancement is noted along the medial (series 10, image 24), posterior(series 10, image 23), and anterior aspect of the resection cavity (series 10, image 20), which may be postsurgical but is concerning for residual tumor given the contours of the original mass in these locations. Slightly increased surrounding T2  hyperintense signal and trace fluid collection subjacent to the craniotomy flap, not unexpected post surgically. Small amount of pneumocephalus is also not unexpected post surgically. Restricted diffusion with ADC correlate about the resection cavity, also not unexpected postoperatively. Some abnormal signal on restricted diffusion is associated with the previously mentioned pneumocephalus along the vertex. No evidence of distal acute infarct or hemorrhage. No significant midline shift. No hydrocephalus. Vascular: Normal arterial flow voids. Normal arterial and venous enhancement. Skull and upper cervical spine: Right parietal craniotomy. Otherwise normal marrow signal. Sinuses/Orbits: No acute finding. Status post bilateral lens replacements. Other: Fluid in left mastoid air cells. IMPRESSION: Status post right parietal craniotomy and resection of a previously noted posterior right temporal lobe mass. Enhancement about the resection cavity may be postsurgical but is concerning for residual tumor. Attention on follow-up. Electronically Signed   By: AMerilyn BabaM.D.   On: 03/31/2022 01:01   [place]  Result Date: 03/25/2022 CLINICAL DATA:  Normal CT scan. Fullness of the left renal collecting system and ureter. EXAM: RENAL / URINARY TRACT ULTRASOUND COMPLETE COMPARISON:  CT of the abdomen and pelvis 03/24/2022 FINDINGS: Right Kidney: Renal measurements: 8.8 x 3.7 x 3.5 cm = volume: 60.6 mL. Echogenicity within normal limits. No mass or hydronephrosis visualized. Parenchymal thinning noted. Left Kidney: Renal measurements: 11.1 x 6.2 x 4.7 cm = volume: 171.0 ML. No hydronephrosis is present. Prominent central sinus fat again noted. Parenchyma is within normal limits. Bladder: Appears normal for degree of bladder distention. Details are not well seen due to lack of distension. IMPRESSION: 1. No hydronephrosis. 2. Right kidney is smaller than the left with parenchymal thinning. 3. Left kidney is normal in  appearance. 4. Details of the bladder are not well seen due to lack of distension. Electronically Signed   By: CSan MorelleM.D.   On: 03/25/2022 14:31   ECHOCARDIOGRAM COMPLETE  Result Date: 03/24/2022    ECHOCARDIOGRAM REPORT   Patient Name:   ESHANOAH ASBILLDate of Exam: 03/24/2022 Medical Rec #:  [id number]         Height:       62.0 in Accession #:    [id number]  Weight:       163.1 lb Date of Birth:  07-01-1934         BSA:          1.753 m Patient Age:    32 years           BP:           141/70 mmHg Patient Gender: F                  HR:           97 bpm. Exam Location:  Inpatient Procedure: 2D Echo, Cardiac Doppler and Color Doppler Indications:    Syncope  History:        Patient has no prior history of Echocardiogram examinations.                 Signs/Symptoms:Syncope; Risk Factors:Hypertension and                 Dyslipidemia. Leukocytosis.  Sonographer:    Eartha Inch Referring Phys: [id number] VISHAL R PATEL  Sonographer Comments: Technically difficult study due to poor echo windows. Image acquisition challenging due to patient body habitus and Image acquisition challenging due to respiratory motion. IMPRESSIONS  1. Left ventricular ejection fraction, by estimation, is 65 to 70%. The left ventricle has normal function. The left ventricle has no regional wall motion abnormalities. Left ventricular diastolic parameters are indeterminate.  2. Right ventricular systolic function is normal. The right ventricular size is normal. There is mildly elevated pulmonary artery systolic pressure.  3. Left atrial size was moderately dilated.  4. Right atrial size was mildly dilated.  5. The mitral valve is normal in structure. Trivial mitral valve regurgitation. No evidence of mitral stenosis.  6. The aortic valve is tricuspid. There is mild calcification of the aortic valve. Aortic valve regurgitation is trivial. Aortic valve sclerosis/calcification is present, without any evidence of  aortic stenosis.  7. The inferior vena cava is normal in size with greater than 50% respiratory variability, suggesting right atrial pressure of 3 mmHg. FINDINGS  Left Ventricle: Left ventricular ejection fraction, by estimation, is 65 to 70%. The left ventricle has normal function. The left ventricle has no regional wall motion abnormalities. The left ventricular internal cavity size was normal in size. There is  no left ventricular hypertrophy. Left ventricular diastolic parameters are indeterminate. Right Ventricle: The right ventricular size is normal. No increase in right ventricular wall thickness. Right ventricular systolic function is normal. There is mildly elevated pulmonary artery systolic pressure. The tricuspid regurgitant velocity is 2.84  m/s, and with an assumed right atrial pressure of 8 mmHg, the estimated right ventricular systolic pressure is 93.7 mmHg. Left Atrium: Left atrial size was moderately dilated. Right Atrium: Right atrial size was mildly dilated. Pericardium: There is no evidence of pericardial effusion. Mitral Valve: The mitral valve is normal in structure. Mild mitral annular calcification. Trivial mitral valve regurgitation. No evidence of mitral valve stenosis. Tricuspid Valve: The tricuspid valve is normal in structure. Tricuspid valve regurgitation is mild . No evidence of tricuspid stenosis. Aortic Valve: The aortic valve is tricuspid. There is mild calcification of the aortic valve. Aortic valve regurgitation is trivial. Aortic valve sclerosis/calcification is present, without any evidence of aortic stenosis. Pulmonic Valve: The pulmonic valve was normal in structure. Pulmonic valve regurgitation is trivial. No evidence of pulmonic stenosis. Aorta: The aortic root is normal in size and structure. Venous: The inferior vena cava is normal in size with greater  than 50% respiratory variability, suggesting right atrial pressure of 3 mmHg. IAS/Shunts: No atrial level shunt detected  by color flow Doppler.  LEFT VENTRICLE PLAX 2D LVIDd:         3.60 cm     Diastology LVIDs:         2.20 cm     LV e' medial:  12.10 cm/s LV PW:         0.90 cm     LV e' lateral: 6.96 cm/s LV IVS:        0.90 cm LVOT diam:     1.80 cm LV SV:         69 LV SV Index:   39 LVOT Area:     2.54 cm  LV Volumes (MOD) LV vol d, MOD A2C: 60.5 ml LV vol d, MOD A4C: 65.9 ml LV vol s, MOD A2C: 18.5 ml LV vol s, MOD A4C: 22.6 ml LV SV MOD A2C:     42.0 ml LV SV MOD A4C:     65.9 ml LV SV MOD BP:      41.5 ml RIGHT VENTRICLE             IVC RV S prime:     13.30 cm/s  IVC diam: 1.80 cm TAPSE (M-mode): 2.1 cm LEFT ATRIUM             Index        RIGHT ATRIUM           Index LA diam:        4.00 cm 2.28 cm/m   RA Area:     13.30 cm LA Vol (A2C):   58.0 ml 33.08 ml/m  RA Volume:   31.50 ml  17.97 ml/m LA Vol (A4C):   75.8 ml 43.24 ml/m LA Biplane Vol: 67.2 ml 38.33 ml/m  AORTIC VALVE LVOT Vmax:   123.00 cm/s LVOT Vmean:  86.100 cm/s LVOT VTI:    0.272 m  AORTA Ao Root diam: 3.00 cm Ao Asc diam:  2.80 cm TRICUSPID VALVE TR Peak grad:   32.3 mmHg TR Mean grad:   23.0 mmHg TR Vmax:        284.00 cm/s TR Vmean:       229.0 cm/s  SHUNTS Systemic VTI:  0.27 m Systemic Diam: 1.80 cm Glori Bickers MD Electronically signed by Glori Bickers MD Signature Date/Time: 03/24/2022/4:57:10 PM    Final    CT CHEST ABDOMEN PELVIS W CONTRAST  Result Date: 03/24/2022 CLINICAL DATA:  Suspected metastatic disease the brain versus is CNS primary, unknown primary neoplasm. * Tracking Code: BO * EXAM: CT CHEST, ABDOMEN, AND PELVIS WITH CONTRAST TECHNIQUE: Multidetector CT imaging of the chest, abdomen and pelvis was performed following the standard protocol during bolus administration of intravenous contrast. RADIATION DOSE REDUCTION: This exam was performed according to the departmental dose-optimization program which includes automated exposure control, adjustment of the mA and/or kV according to patient size and/or use of iterative  reconstruction technique. CONTRAST:  176m OMNIPAQUE IOHEXOL 300 MG/ML  SOLN COMPARISON:  None Available. FINDINGS: CT CHEST FINDINGS Cardiovascular: Heart size moderately enlarged. No pericardial effusion. Calcified aortic atherosclerotic changes. No aortic dilation. Normal caliber central pulmonary vasculature. Mediastinum/Nodes: No thoracic inlet, axillary, mediastinal or hilar adenopathy. Esophagus grossly normal. Lungs/Pleura: Mild basilar atelectasis. Airways are patent. No pleural effusion or dense consolidative changes. No suspicious pulmonary mass or nodule. Musculoskeletal: See below for full musculoskeletal details. No chest wall mass. CT ABDOMEN PELVIS FINDINGS Hepatobiliary:  No focal, suspicious hepatic lesion. No pericholecystic stranding. Mild gallbladder distension. No biliary duct dilation. Cholelithiasis. Small portal to hepatic venous shunt in the LEFT hepatic lobe (image 56/2) 6 mm at the site of portal to hepatic venous shunting. Hepatic veins are patent. Portal vein is patent. Pancreas: Mild atrophy of the pancreas without ductal dilation, inflammation or visible lesion. Spleen: Normal. Adrenals/Urinary Tract: Adrenal glands are normal. 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 No suspicious renal lesion. Mild fullness of LEFT ureter and renal pelvis. No discrete site of ureteral transition. No perinephric stranding. LEFT UVJ in urinary bladder cannot be evaluated further due to streak artifact from bilateral hip arthroplasty changes. Stomach/Bowel: Small hiatal hernia. No acute gastrointestinal findings. Mild colonic diverticulosis of the sigmoid colon. Vascular/Lymphatic: Aortic atherosclerosis. No sign of aneurysm. Smooth contour of the IVC. There is no gastrohepatic or hepatoduodenal ligament lymphadenopathy. No retroperitoneal or mesenteric lymphadenopathy. No pelvic sidewall lymphadenopathy. Reproductive: Not well assessed  given streak artifact from bilateral hip arthroplasty changes. Other: No ascites. Musculoskeletal: Post bilateral hip arthroplasty changes, incompletely evaluated. Spinal degenerative changes. Degenerative changes most pronounced in the lower thoracic and in the lumbar spine with levo convexity of the lumbar spine related to degenerative changes. No acute or destructive bone process. Signs of LEFT shoulder arthroplasty as well. IMPRESSION: 1. No evidence of primary or metastatic disease in the chest, abdomen or pelvis. 2. Mild fullness of LEFT ureter and renal pelvis. No discrete site of ureteral transition. LEFT UVJ in urinary bladder cannot be evaluated further due to streak artifact from bilateral hip arthroplasty changes. And consider short correlate with urinalysis interval follow-up with renal sonogram to exclude developing or worsening of collecting system and ureteral dilation in this patient with a very small RIGHT kidney that likely shows limited function relative to the LEFT kidney. Note that the urinary bladder and LEFT UVJ cannot be assessed for signs of obstructing lesion in this location but the ureter is nondilated below the level of the mid LEFT ureter. 3. Cholelithiasis without evidence of acute cholecystitis. 4. 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 5. Mild colonic diverticulosis of the sigmoid colon. 6. Small hiatal hernia. 7. Aortic atherosclerosis. 8. 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 Aortic Atherosclerosis (ICD10-I70.0). Electronically Signed   By: Zetta Bills M.D.   On: 03/24/2022 15:12   MR BRAIN W WO CONTRAST  Result Date: 03/24/2022 CLINICAL DATA:  Passed out.  Abnormal head CT. EXAM: MRI HEAD WITHOUT AND WITH CONTRAST TECHNIQUE: Multiplanar, multiecho pulse sequences of the brain and surrounding structures were obtained without and  with intravenous contrast. CONTRAST:  66m GADAVIST GADOBUTROL 1 MMOL/ML IV SOLN COMPARISON:  Head CT from yesterday and 08/07/2018 FINDINGS: Brain: Mass in the posterior right temporal lobe which is peripherally enhancing at areas showing dense cellular features. Central non enhancement with necrotic appearance. The mass appears intra-axial and measures up to 4 cm. Regional T2 hyperintensity and swelling at least partially from vasogenic edema. The mass reaches the atrium of the right lateral ventricle without evidence of ependymal spread. No second mass is seen. No abnormality seen in this area on 2020 head CT. No acute infarct, hydrocephalus, or collection. Vascular: Major flow voids and vascular enhancements are preserved Skull and upper cervical spine: No aggressive bone lesion. Sinuses/Orbits:  Bilateral cataract resection. Partial left mastoid opacification. 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 Electronically Signed   By: Jorje Guild M.D.   On: 03/24/2022 07:27   CT Head Wo Contrast  Result Date: 03/23/2022 CLINICAL DATA:  Single episode EXAM: CT HEAD WITHOUT CONTRAST TECHNIQUE: Contiguous axial images were obtained from the base of the skull through the vertex without intravenous contrast. RADIATION DOSE REDUCTION: This exam was performed according to the departmental dose-optimization program which includes automated exposure control, adjustment of the mA and/or kV according to patient size and/or use of iterative reconstruction technique. COMPARISON:  Fifty 08/08/2018 FINDINGS: Brain: There is new subcortical hypodensity in the RIGHT parietal temporal lobe (image 13/series 2) which has a rounded shape measuring 1.5 cm. There is a vasogenic edema pattern extending into the temporal operculum (image 9/2. No intracranial hemorrhage. No extra-axial fluid collections. No midline shift or mass effect. Vascular: No  hyperdense vessel or unexpected calcification. Skull: Normal. Negative for fracture or focal lesion. Sinuses/Orbits: No acute finding. Other: None. IMPRESSION: 1. New subcortical hypodensity in the RIGHT temporoparietal lobe representing either a white matter infarction or mass lesion. Recommend MRI brain without and with contrast for further characterization. 2. No midline shift or mass effect.  No intraparenchymal hemorrhage. Electronically Signed   By: Suzy Bouchard M.D.   On: 03/23/2022 17:36   DG Chest Portable 1 View  Result Date: 03/23/2022 CLINICAL DATA:  Syncope EXAM: PORTABLE CHEST 1 VIEW COMPARISON:  Chest radiograph November 11, 2017. FINDINGS: Enlarged cardiac silhouette. Aortic atherosclerosis. No focal airspace consolidation or overt pulmonary edema. Left shoulder arthroplasty. Severe degenerative change of the right shoulder. Thoracic spondylosis. IMPRESSION: Enlarged cardiac silhouette without overt pulmonary edema or focal airspace consolidation. Electronically Signed   By: Dahlia Bailiff M.D.   On: 03/23/2022 13:31       IMPRESSION/PLAN: 1. High grade Glioma of the Right Temporal lobe. I discussed the pathology findings and reviewed the nature of primary brain disease.  I agree with Dr. Renda Rolls recommendation for chemoradiation corresponding to 6 weeks of chemoRT. We discussed the risks, benefits, short, and long term effects of radiotherapy, as well as the curative intent, and the patient is interested in proceeding. Dr. Lisbeth Renshaw discusses the delivery and logistics of radiotherapy and anticipates a course of 6 weeks of radiotherapy. Written consent is obtained and placed in the chart, a copy was provided to the patient. The patient will be contacted to coordinate treatment planning by our simulation department.    In a visit lasting 52 minutes, greater than 50% of the time was spent face to face discussing the patient's condition, in preparation for the discussion, and coordinating the  patient's care.    ________________________________   Jodelle Gross, MD, PhD    **Disclaimer: This note was dictated with voice recognition software. Similar sounding words can inadvertently be transcribed and this note may contain transcription errors which may not have been corrected upon publication of note.**

## 2022-04-08 NOTE — Progress Notes (Signed)
[facility] [place], [place] [address] [phone]   New Patient Evaluation  Date of Service: 04/08/22 Patient Name: Lauren Hill Patient MRN: [id number] Patient DOB: [birth year] Provider: Ventura Sellers, MD  Identifying Statement:  Lauren Hill is a 87 y.o. female with right temporal  high grade glioma  who presents for initial consultation and evaluation.    Referring Provider: Burnard Bunting, MD [address] [place],  [place] [address]  Oncologic History: Oncology History  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 [facility])  03/29/2022 Surgery   Craniotomy, resection of right temporal mass with Dr. Arnoldo Morale; path demonstrates high grade glioma, IDH pending   04/08/2022 Initial Diagnosis   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 ([facility])     Biomarkers:  MGMT Unknown.  IDH 1/2 Unknown.  EGFR Unknown  TERT Unknown   History of Present Illness: The patient's records from the referring physician were obtained and reviewed and the patient interviewed to confirm this HPI.  Lauren Hill presented last month with episode of LOC while at church.  She was "out for short time" then returned to normal shortly thereafter.  CNS imaging demonstrates large enhancing right temporal mass.  She underwent craniotomy, resection with Dr. Arnoldo Morale on 03/29/22; prelim path was high grade glioma pending final.  Since surgery, she complains of some issues with hearing, otherwise functionally intact.  Walking on her own with the walker, energy level is good.  Continues on Keppra 520m twice per day, Decadron 268mtwice per day.  Medications: Current Outpatient Medications on File Prior to Visit  Medication Sig Dispense Refill   acetaminophen (TYLENOL) 650 MG CR tablet Take 1,300 mg by mouth in the morning, at noon, and at bedtime.     amLODipine (NORVASC) 5 MG tablet Take 5 mg by mouth 2 (two) times daily.       atenolol (TENORMIN) 50 MG tablet Take 50 mg by mouth at bedtime.     atorvastatin (LIPITOR) 10 MG tablet Take 10 mg by mouth daily at 6 PM.      cholecalciferol (VITAMIN D3) 25 MCG (1000 UNIT) tablet Take 1,000 Units by mouth daily.     dexamethasone (DECADRON) 2 MG tablet Take 1 tablet (2 mg total) by mouth every 8 (eight) hours. 90 tablet 0   famotidine (PEPCID) 10 MG tablet Take 10 mg by mouth at bedtime as needed for heartburn or indigestion.     levETIRAcetam (KEPPRA) 500 MG tablet Take 1 tablet (500 mg total) by mouth 2 (two) times daily. 60 tablet 0   magnesium gluconate (MAGONATE) 500 MG tablet Take 500 mg by mouth at bedtime.     multivitamin-lutein (OCUVITE-LUTEIN) CAPS capsule Take 1 capsule by mouth in the morning and at bedtime.     Potassium 99 MG TABS Take 99 mg by mouth at bedtime.     No current facility-administered medications on file prior to visit.    Allergies: No Known Allergies Past Medical History:  Past Medical History:  Diagnosis Date   Arthritis    GERD (gastroesophageal reflux disease)    indigestion   History of blood transfusion    as an infant   History of hiatal hernia    History of pneumonia    Hyperlipidemia    Hypertension    Renal cyst    Past Surgical History:  Past Surgical History:  Procedure Laterality Date   APPLICATION OF CRANIAL NAVIGATION  Right 03/29/2022   Procedure: APPLICATION OF CRANIAL NAVIGATION;  Surgeon: Newman Pies, MD;  Location: [place];  Service: Neurosurgery;  Laterality: Right;   COLONOSCOPY     CRANIOTOMY Right 03/29/2022   Procedure: CRANIOTOMY TUMOR EXCISION;  Surgeon: Newman Pies, MD;  Location: [facility];  Service: Neurosurgery;  Laterality: Right;   DILATION AND CURETTAGE OF UTERUS     x2   EYE SURGERY     cataracts   HIP SURGERY  5/05   right hip replacement   HIP SURGERY  5/11   left hip replacement   ORIF HUMERUS FRACTURE Left 11/11/2017   Procedure: OPEN REDUCTION INTERNAL FIXATION (ORIF) LEFT DISTAL  HUMERUS FRACTURE;  Surgeon: Shona Needles, MD;  Location: [facility];  Service: Orthopedics;  Laterality: Left;   RHINOPLASTY     TONSILLECTOMY     TOTAL SHOULDER ARTHROPLASTY Left 04/01/2016   Procedure: LEFT TOTAL SHOULDER ARTHROPLASTY;  Surgeon: Justice Britain, MD;  Location: [facility];  Service: Orthopedics;  Laterality: Left;   Social History:  Social History   Socioeconomic History   Marital status: Widowed    Spouse name: Not on file   Number of children: Not on file   Years of education: Not on file   Highest education level: Not on file  Occupational History   Not on file  Tobacco Use   Smoking status: Never   Smokeless tobacco: Never  Vaping Use   Vaping Use: Never used  Substance and Sexual Activity   Alcohol use: No   Drug use: No   Sexual activity: Not Currently  Other Topics Concern   Not on file  Social History Narrative   Not on file   Social Determinants of Health   Financial Resource Strain: Not on file  Food Insecurity: No Food Insecurity (03/23/2022)   Hunger Vital Sign    Worried About Running Out of Food in the Last Year: Never true    Ran Out of Food in the Last Year: Never true  Transportation Needs: No Transportation Needs (03/24/2022)   PRAPARE - Hydrologist (Medical): No    Lack of Transportation (Non-Medical): No  Physical Activity: Not on file  Stress: Not on file  Social Connections: Not on file  Intimate Partner Violence: Not At Risk (03/24/2022)   Humiliation, Afraid, Rape, and Kick questionnaire    Fear of Current or Ex-Partner: No    Emotionally Abused: No    Physically Abused: No    Sexually Abused: No   Family History:  Family History  Problem Relation Age of Onset   Heart disease Mother     Review of Systems: Constitutional: Doesn't report fevers, chills or abnormal weight loss Eyes: Doesn't report blurriness of vision Ears, nose, mouth, throat, and face: Doesn't report sore throat Respiratory:  Doesn't report cough, dyspnea or wheezes Cardiovascular: Doesn't report palpitation, chest discomfort  Gastrointestinal:  Doesn't report nausea, constipation, diarrhea GU: Doesn't report incontinence Skin: Doesn't report skin rashes Neurological: Per HPI Musculoskeletal: Doesn't report joint pain Behavioral/Psych: Doesn't report anxiety  Physical Exam: Vitals:   04/08/22 1142  BP: 127/89  Pulse: 78  Resp: 16  Temp: 97.8 F (36.6 C)  SpO2: 93%   KPS: 80. ECOG 1  General: Alert, cooperative, pleasant, in no acute distress Head: Normal EENT: No conjunctival injection or scleral icterus.  Lungs: Resp effort normal Cardiac: Regular rate Abdomen: Non-distended abdomen Skin: No rashes cyanosis or petechiae. Extremities: No clubbing or edema  Neurologic Exam:  Mental Status: Awake, alert, attentive to examiner. Oriented to self and environment. Language is fluent with intact comprehension.  Cranial Nerves: Visual acuity is grossly normal. Visual fields are full. Extra-ocular movements intact. No ptosis. Face is symmetric Motor: Tone and bulk are normal. Power is full in both arms and legs. Reflexes are symmetric, no pathologic reflexes present.  Sensory: Intact to light touch Gait: Orthopedic and neurologic dystaxia   Labs: I have reviewed the data as listed    Component Value Date/Time   NA 137 03/29/2022 1655   K 3.1 (L) 03/29/2022 1655   CL 107 03/24/2022 0517   CO2 23 03/24/2022 0517   GLUCOSE 101 (H) 03/24/2022 0517   BUN 23 03/24/2022 0517   CREATININE 0.90 03/24/2022 0517   CALCIUM 9.6 03/24/2022 0517   CALCIUM 8.8 11/11/2017 0637   PROT 6.3 (L) 11/11/2017 0637   ALBUMIN 3.3 (L) 11/11/2017 0637   AST 19 11/11/2017 0637   ALT 18 11/11/2017 0637   ALKPHOS 73 11/11/2017 0637   BILITOT 0.8 11/11/2017 0637   GFRNONAA >60 03/24/2022 0517   GFRAA >60 11/11/2017 0637   Lab Results  Component Value Date   WBC 13.8 (H) 03/24/2022   NEUTROABS 9.7 (H) 03/24/2022    HGB 11.2 (L) 03/29/2022   HCT 33.0 (L) 03/29/2022   MCV 98.5 03/24/2022   PLT 267 03/24/2022    Imaging: MR BRAIN W WO CONTRAST  Result Date: 03/31/2022 CLINICAL DATA:  Post craniotomy EXAM: MRI HEAD WITHOUT AND WITH CONTRAST TECHNIQUE: Multiplanar, multiecho pulse sequences of the brain and surrounding structures were obtained without and with intravenous contrast. CONTRAST:  3m GADAVIST GADOBUTROL 1 MMOL/ML IV SOLN COMPARISON:  03/24/2022 FINDINGS: Brain: Status post right parietal craniotomy and resection of a previously noted posterior right temporal lobe mass. Right posterior temporal resection cavity demonstrates internal fluid and peripheral intrinsic T1 hyperintense material, likely blood products. Focal enhancement is noted along the medial (series 10, image 24), posterior(series 10, image 23), and anterior aspect of the resection cavity (series 10, image 20), which may be postsurgical but is concerning for residual tumor given the contours of the original mass in these locations. Slightly increased surrounding T2 hyperintense signal and trace fluid collection subjacent to the craniotomy flap, not unexpected post surgically. Small amount of pneumocephalus is also not unexpected post surgically. Restricted diffusion with ADC correlate about the resection cavity, also not unexpected postoperatively. Some abnormal signal on restricted diffusion is associated with the previously mentioned pneumocephalus along the vertex. No evidence of distal acute infarct or hemorrhage. No significant midline shift. No hydrocephalus. Vascular: Normal arterial flow voids. Normal arterial and venous enhancement. Skull and upper cervical spine: Right parietal craniotomy. Otherwise normal marrow signal. Sinuses/Orbits: No acute finding. Status post bilateral lens replacements. Other: Fluid in left mastoid air cells. IMPRESSION: Status post right parietal craniotomy and resection of a previously noted posterior right  temporal lobe mass. Enhancement about the resection cavity may be postsurgical but is concerning for residual tumor. Attention on follow-up. Electronically Signed   By: AMerilyn BabaM.D.   On: 03/31/2022 01:01   [place]NAL  Result Date: 03/25/2022 CLINICAL DATA:  Normal CT scan. Fullness of the left renal collecting system and ureter. EXAM: RENAL / URINARY TRACT ULTRASOUND COMPLETE COMPARISON:  CT of the abdomen and pelvis 03/24/2022 FINDINGS: Right Kidney: Renal measurements: 8.8 x 3.7 x 3.5 cm = volume: 60.6 mL. Echogenicity within normal limits. No mass or hydronephrosis visualized. Parenchymal thinning noted. Left Kidney: Renal measurements:  11.1 x 6.2 x 4.7 cm = volume: 171.0 ML. No hydronephrosis is present. Prominent central sinus fat again noted. Parenchyma is within normal limits. Bladder: Appears normal for degree of bladder distention. Details are not well seen due to lack of distension. IMPRESSION: 1. No hydronephrosis. 2. Right kidney is smaller than the left with parenchymal thinning. 3. Left kidney is normal in appearance. 4. Details of the bladder are not well seen due to lack of distension. Electronically Signed   By: San Morelle M.D.   On: 03/25/2022 14:31   ECHOCARDIOGRAM COMPLETE  Result Date: 03/24/2022    ECHOCARDIOGRAM REPORT   Patient Name:   Lauren Hill Date of Exam: 03/24/2022 Medical Rec #:  [id number]          Height:       62.0 in Accession #:    [id number]         Weight:       163.1 lb Date of Birth:  Nov 28, 1934         BSA:          1.753 m Patient Age:    74 years           BP:           141/70 mmHg Patient Gender: F                  HR:           97 bpm. Exam Location:  Inpatient Procedure: 2D Echo, Cardiac Doppler and Color Doppler Indications:    Syncope  History:        Patient has no prior history of Echocardiogram examinations.                 Signs/Symptoms:Syncope; Risk Factors:Hypertension and                 Dyslipidemia. Leukocytosis.  Sonographer:     Eartha Inch Referring Phys: [id number] VISHAL R PATEL  Sonographer Comments: Technically difficult study due to poor echo windows. Image acquisition challenging due to patient body habitus and Image acquisition challenging due to respiratory motion. IMPRESSIONS  1. Left ventricular ejection fraction, by estimation, is 65 to 70%. The left ventricle has normal function. The left ventricle has no regional wall motion abnormalities. Left ventricular diastolic parameters are indeterminate.  2. Right ventricular systolic function is normal. The right ventricular size is normal. There is mildly elevated pulmonary artery systolic pressure.  3. Left atrial size was moderately dilated.  4. Right atrial size was mildly dilated.  5. The mitral valve is normal in structure. Trivial mitral valve regurgitation. No evidence of mitral stenosis.  6. The aortic valve is tricuspid. There is mild calcification of the aortic valve. Aortic valve regurgitation is trivial. Aortic valve sclerosis/calcification is present, without any evidence of aortic stenosis.  7. The inferior vena cava is normal in size with greater than 50% respiratory variability, suggesting right atrial pressure of 3 mmHg. FINDINGS  Left Ventricle: Left ventricular ejection fraction, by estimation, is 65 to 70%. The left ventricle has normal function. The left ventricle has no regional wall motion abnormalities. The left ventricular internal cavity size was normal in size. There is  no left ventricular hypertrophy. Left ventricular diastolic parameters are indeterminate. Right Ventricle: The right ventricular size is normal. No increase in right ventricular wall thickness. Right ventricular systolic function is normal. There is mildly elevated pulmonary artery systolic pressure. The tricuspid regurgitant velocity is 2.84  m/s, and  with an assumed right atrial pressure of 8 mmHg, the estimated right ventricular systolic pressure is 27.7 mmHg. Left Atrium: Left  atrial size was moderately dilated. Right Atrium: Right atrial size was mildly dilated. Pericardium: There is no evidence of pericardial effusion. Mitral Valve: The mitral valve is normal in structure. Mild mitral annular calcification. Trivial mitral valve regurgitation. No evidence of mitral valve stenosis. Tricuspid Valve: The tricuspid valve is normal in structure. Tricuspid valve regurgitation is mild . No evidence of tricuspid stenosis. Aortic Valve: The aortic valve is tricuspid. There is mild calcification of the aortic valve. Aortic valve regurgitation is trivial. Aortic valve sclerosis/calcification is present, without any evidence of aortic stenosis. Pulmonic Valve: The pulmonic valve was normal in structure. Pulmonic valve regurgitation is trivial. No evidence of pulmonic stenosis. Aorta: The aortic root is normal in size and structure. Venous: The inferior vena cava is normal in size with greater than 50% respiratory variability, suggesting right atrial pressure of 3 mmHg. IAS/Shunts: No atrial level shunt detected by color flow Doppler.  LEFT VENTRICLE PLAX 2D LVIDd:         3.60 cm     Diastology LVIDs:         2.20 cm     LV e' medial:  12.10 cm/s LV PW:         0.90 cm     LV e' lateral: 6.96 cm/s LV IVS:        0.90 cm LVOT diam:     1.80 cm LV SV:         69 LV SV Index:   39 LVOT Area:     2.54 cm  LV Volumes (MOD) LV vol d, MOD A2C: 60.5 ml LV vol d, MOD A4C: 65.9 ml LV vol s, MOD A2C: 18.5 ml LV vol s, MOD A4C: 22.6 ml LV SV MOD A2C:     42.0 ml LV SV MOD A4C:     65.9 ml LV SV MOD BP:      41.5 ml RIGHT VENTRICLE             IVC RV S prime:     13.30 cm/s  IVC diam: 1.80 cm TAPSE (M-mode): 2.1 cm LEFT ATRIUM             Index        RIGHT ATRIUM           Index LA diam:        4.00 cm 2.28 cm/m   RA Area:     13.30 cm LA Vol (A2C):   58.0 ml 33.08 ml/m  RA Volume:   31.50 ml  17.97 ml/m LA Vol (A4C):   75.8 ml 43.24 ml/m LA Biplane Vol: 67.2 ml 38.33 ml/m  AORTIC VALVE LVOT Vmax:    123.00 cm/s LVOT Vmean:  86.100 cm/s LVOT VTI:    0.272 m  AORTA Ao Root diam: 3.00 cm Ao Asc diam:  2.80 cm TRICUSPID VALVE TR Peak grad:   32.3 mmHg TR Mean grad:   23.0 mmHg TR Vmax:        284.00 cm/s TR Vmean:       229.0 cm/s  SHUNTS Systemic VTI:  0.27 m Systemic Diam: 1.80 cm Glori Bickers MD Electronically signed by Glori Bickers MD Signature Date/Time: 03/24/2022/4:57:10 PM    Final    CT CHEST ABDOMEN PELVIS W CONTRAST  Result Date: 03/24/2022 CLINICAL DATA:  Suspected metastatic disease the brain versus is CNS primary, unknown  primary neoplasm. * Tracking Code: BO * EXAM: CT CHEST, ABDOMEN, AND PELVIS WITH CONTRAST TECHNIQUE: Multidetector CT imaging of the chest, abdomen and pelvis was performed following the standard protocol during bolus administration of intravenous contrast. RADIATION DOSE REDUCTION: This exam was performed according to the departmental dose-optimization program which includes automated exposure control, adjustment of the mA and/or kV according to patient size and/or use of iterative reconstruction technique. CONTRAST:  152m OMNIPAQUE IOHEXOL 300 MG/ML  SOLN COMPARISON:  None Available. FINDINGS: CT CHEST FINDINGS Cardiovascular: Heart size moderately enlarged. No pericardial effusion. Calcified aortic atherosclerotic changes. No aortic dilation. Normal caliber central pulmonary vasculature. Mediastinum/Nodes: No thoracic inlet, axillary, mediastinal or hilar adenopathy. Esophagus grossly normal. Lungs/Pleura: Mild basilar atelectasis. Airways are patent. No pleural effusion or dense consolidative changes. No suspicious pulmonary mass or nodule. Musculoskeletal: See below for full musculoskeletal details. No chest wall mass. CT ABDOMEN PELVIS FINDINGS Hepatobiliary: No focal, suspicious hepatic lesion. No pericholecystic stranding. Mild gallbladder distension. No biliary duct dilation. Cholelithiasis. Small portal to hepatic venous shunt in the LEFT hepatic lobe (image  56/2) 6 mm at the site of portal to hepatic venous shunting. Hepatic veins are patent. Portal vein is patent. Pancreas: Mild atrophy of the pancreas without ductal dilation, inflammation or visible lesion. Spleen: Normal. Adrenals/Urinary Tract: Adrenal glands are normal. 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 No suspicious renal lesion. Mild fullness of LEFT ureter and renal pelvis. No discrete site of ureteral transition. No perinephric stranding. LEFT UVJ in urinary bladder cannot be evaluated further due to streak artifact from bilateral hip arthroplasty changes. Stomach/Bowel: Small hiatal hernia. No acute gastrointestinal findings. Mild colonic diverticulosis of the sigmoid colon. Vascular/Lymphatic: Aortic atherosclerosis. No sign of aneurysm. Smooth contour of the IVC. There is no gastrohepatic or hepatoduodenal ligament lymphadenopathy. No retroperitoneal or mesenteric lymphadenopathy. No pelvic sidewall lymphadenopathy. Reproductive: Not well assessed given streak artifact from bilateral hip arthroplasty changes. Other: No ascites. Musculoskeletal: Post bilateral hip arthroplasty changes, incompletely evaluated. Spinal degenerative changes. Degenerative changes most pronounced in the lower thoracic and in the lumbar spine with levo convexity of the lumbar spine related to degenerative changes. No acute or destructive bone process. Signs of LEFT shoulder arthroplasty as well. IMPRESSION: 1. No evidence of primary or metastatic disease in the chest, abdomen or pelvis. 2. Mild fullness of LEFT ureter and renal pelvis. No discrete site of ureteral transition. LEFT UVJ in urinary bladder cannot be evaluated further due to streak artifact from bilateral hip arthroplasty changes. And consider short correlate with urinalysis interval follow-up with renal sonogram to exclude developing or worsening of collecting system and ureteral dilation in  this patient with a very small RIGHT kidney that likely shows limited function relative to the LEFT kidney. Note that the urinary bladder and LEFT UVJ cannot be assessed for signs of obstructing lesion in this location but the ureter is nondilated below the level of the mid LEFT ureter. 3. Cholelithiasis without evidence of acute cholecystitis. 4. 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 5. Mild colonic diverticulosis of the sigmoid colon. 6. Small hiatal hernia. 7. Aortic atherosclerosis. 8. 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 Aortic Atherosclerosis (ICD10-I70.0). Electronically Signed   By: GZetta BillsM.D.   On: 03/24/2022 15:12   MR BRAIN W WO CONTRAST  Result Date: 03/24/2022 CLINICAL DATA:  Passed out.  Abnormal head CT. EXAM: MRI HEAD WITHOUT AND WITH CONTRAST TECHNIQUE: Multiplanar, multiecho pulse sequences of the brain and surrounding structures were obtained without and with intravenous contrast. CONTRAST:  57m GADAVIST GADOBUTROL 1 MMOL/ML IV SOLN COMPARISON:  Head CT from yesterday and 08/07/2018 FINDINGS: Brain: Mass in the posterior right temporal lobe which is peripherally enhancing at areas showing dense cellular features. Central non enhancement with necrotic appearance. The mass appears intra-axial and measures up to 4 cm. Regional T2 hyperintensity and swelling at least partially from vasogenic edema. The mass reaches the atrium of the right lateral ventricle without evidence of ependymal spread. No second mass is seen. No abnormality seen in this area on 2020 head CT. No acute infarct, hydrocephalus, or collection. Vascular: Major flow voids and vascular enhancements are preserved Skull and upper cervical spine: No aggressive bone lesion. Sinuses/Orbits: Bilateral cataract resection. Partial left mastoid opacification. IMPRESSION: 4 cm necrotic  mass in the posterior right temporal lobe more concerning for glioblastoma than solitary metastasis. Electronically Signed   By: JJorje GuildM.D.   On: 03/24/2022 07:27   CT Head Wo Contrast  Result Date: 03/23/2022 CLINICAL DATA:  Single episode EXAM: CT HEAD WITHOUT CONTRAST TECHNIQUE: Contiguous axial images were obtained from the base of the skull through the vertex without intravenous contrast. RADIATION DOSE REDUCTION: This exam was performed according to the departmental dose-optimization program which includes automated exposure control, adjustment of the mA and/or kV according to patient size and/or use of iterative reconstruction technique. COMPARISON:  Fifty 08/08/2018 FINDINGS: Brain: There is new subcortical hypodensity in the RIGHT parietal temporal lobe (image 13/series 2) which has a rounded shape measuring 1.5 cm. There is a vasogenic edema pattern extending into the temporal operculum (image 9/2. No intracranial hemorrhage. No extra-axial fluid collections. No midline shift or mass effect. Vascular: No hyperdense vessel or unexpected calcification. Skull: Normal. Negative for fracture or focal lesion. Sinuses/Orbits: No acute finding. Other: None. IMPRESSION: 1. New subcortical hypodensity in the RIGHT temporoparietal lobe representing either a white matter infarction or mass lesion. Recommend MRI brain without and with contrast for further characterization. 2. No midline shift or mass effect.  No intraparenchymal hemorrhage. Electronically Signed   By: SSuzy BouchardM.D.   On: 03/23/2022 17:36   DG Chest Portable 1 View  Result Date: 03/23/2022 CLINICAL DATA:  Syncope EXAM: PORTABLE CHEST 1 VIEW COMPARISON:  Chest radiograph November 11, 2017. FINDINGS: Enlarged cardiac silhouette. Aortic atherosclerosis. No focal airspace consolidation or overt pulmonary edema. Left shoulder arthroplasty. Severe degenerative change of the right shoulder. Thoracic spondylosis. IMPRESSION: Enlarged  cardiac silhouette without overt pulmonary edema or focal airspace consolidation. Electronically Signed   By: JDahlia BailiffM.D.   On: 03/23/2022 13:31    Pathology:  SURGICAL PATHOLOGY CASE: [id number] PATIENT: [facility] Loeffler Surgical Pathology Report   Clinical History: Brain Mass    FINAL MICROSCOPIC DIAGNOSIS:  A. BRAIN TUMOR, RIGHT TEMPORAL, BIOPSY: -  Morphologically consistent with a high-grade glial neoplasm.  Note: [facility]  GROSS DESCRIPTION:  Received fresh are 2 x 1.6 x 0.3 cm of soft tan-white tissue.  The specimen is entirely submitted in 1 cassette.  (GRP 03/30/2022)   Final Diagnosis performed by MTilford PillarDO.   Electronically signed 03/31/2022 [facility] [address] [place] [address]  Professional component performed at  [facility], [place] [address], [place], [place] [address].  Immunohistochemistry Technical component (if applicable) was performed at [facility]. [address] [place], [place], [place] [address].   IMMUNOHISTOCHEMISTRY DISCLAIMER (if applicable): Some of these immunohistochemical stains may have been developed and the performance characteristics determine by [facility]. Some may not have been cleared or approved by the U.S. Food and Drug Administration. The FDA has determined that such clearance or approval is not necessary. This test is used for clinical purposes. It should not be regarded as investigational or for research. This laboratory is certified under the [place] (CLIA-88) as qualified to perform high complexity clinical laboratory testing.  The controls stained appropriately.    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   We had an extensive conversation with her and her family regarding pathology, prognosis, and available treatment pathways for high grade glioma.  They understand that final histology, tumor genetics are still pending for her case.  We are encouraged by her good functional status, good quality resection.   We ultimately recommended proceeding with course of intensity modulated radiation therapy and concurrent daily Temozolomide.  Radiation will be administered Mon-Fri over 6 weeks, Temodar will be dosed at 2m/m2 to be given daily over 42 days.  We reviewed side effects of temodar, including fatigue, nausea/vomiting, constipation, and cytopenias.  Informed consent was verbally obtained at bedside to proceed with oral chemotherapy.  Chemotherapy should be held for the following:  ANC less than 1,000  Platelets less than 100,000  LFT or creatinine greater than 2x ULN  If clinical concerns/contraindications develop  Every 2 weeks during radiation, labs will be checked accompanied by a clinical evaluation in the brain tumor clinic.  Decadron should be tapered off, as instructed.  Will con't Keppra 5034mBID given possible presentation with seizure.  Screening for potential clinical trials was performed and discussed using eligibility criteria for active protocols at [facility], as well as national database available on [email or web address]   The patient is a candidate and is interested in pursuing a research protocol.  We will have research team reach out to patient regarding ramipril cognition study.  Tumor components are all supratentorial.  Additionally recommended neuro-PT eval, social work consultation.  We also discussed and patient consented for additional tumor profiling and sequencing through [facility] tumor profiling could help identify actionable  mutation for targeted therapy and lead to direct clinical benefit.     We spent twenty additional minutes teaching regarding the natural history, biology, and historical experience in the treatment of brain tumors. We then discussed in detail the current recommendations for therapy focusing on the mode of administration, mechanism of action, anticipated toxicities, and quality of life issues associated with this plan. We also provided teaching sheets for the patient to take home as an additional resource.  All questions were answered. The patient knows to call the clinic with any problems, questions or concerns. No barriers to learning were detected.  The total time spent in the encounter was 60 minutes and more than 50% was on counseling and review of test results   ZaVentura SellersMD Medical Director of Neuro-Oncology [facility] [place] [place]1/02/23 12:05 PM

## 2022-04-08 NOTE — Research (Signed)
Trial:  [id number] A Single Arm, Pilot Study of Ramipril for Preventing Radiation-Induced Cognitive Decline in Glioblastoma (GBM) Patients Receiving Brain Radiotherapy Patient Lauren Hill was identified by Dr Mickeal Skinner as a potential candidate for the above listed study.  This Clinical Research Nurse met with Lauren Hill, [id number], on 04/08/22 in a manner and location that ensures patient privacy to discuss participation in the above listed research study.  Patient is Accompanied by her sister and daughter .  A copy of the informed consent document and separate HIPAA Authorization was provided to the patient.  Patient reads, speaks, and understands [place].   Patient was provided with the business card of this Nurse and encouraged to contact the research team with any questions.  Approximately 15 minutes were spent with the patient reviewing the informed consent documents.  Patient was provided the option of taking informed consent documents home to review and was encouraged to review at their convenience with their support network, including other care providers. Patient took the consent documents home to review. Plan made to follow up with her daughter via phone on Monday 04/12/22; confirmed phone number in the chart.  Marjie Skiff Cathlene Gardella, RN, BSN, [facility] She  Her  Hers Clinical Research Nurse [facility] Direct Dial [phone]  Pager [phone] 04/08/2022 1:58 PM

## 2022-04-09 DIAGNOSIS — C712 Malignant neoplasm of temporal lobe: Secondary | ICD-10-CM | POA: Diagnosis not present

## 2022-04-12 ENCOUNTER — Inpatient Hospital Stay: Payer: PPO

## 2022-04-12 ENCOUNTER — Telehealth: Payer: Self-pay | Admitting: *Deleted

## 2022-04-12 ENCOUNTER — Telehealth: Payer: Self-pay

## 2022-04-12 ENCOUNTER — Other Ambulatory Visit: Payer: Self-pay | Admitting: *Deleted

## 2022-04-12 DIAGNOSIS — C719 Malignant neoplasm of brain, unspecified: Secondary | ICD-10-CM

## 2022-04-12 LAB — SURGICAL PATHOLOGY

## 2022-04-12 NOTE — Telephone Encounter (Signed)
[facility] Tumor profile was completed and faxed.

## 2022-04-12 NOTE — Telephone Encounter (Signed)
CSW attempted to contact patient's daughter, [name], per the request of Dr. Mickeal Skinner.  Left vm.

## 2022-04-12 NOTE — Progress Notes (Signed)
[facility] Clinical Social Work  Clinical Social Work was referred by nurse navigator for assessment of psychosocial needs.  Clinical Social Worker contacted caregiver by phone  to offer support and assess for needs.     Patient's daughter, Jane Teel, wished to speak with CSW regarding services available.  Currently, patient lives alone.  She has all of her basic needs met.  Patient has several friends in the community and her sister lives near her.  Jane is an only child and lives in [place].  She has contacted [facility] and is making arrangements for someone to be with her through treatment.  Patient informed Jane that she wants to remain in her home and will not get depressed.  CSW informed her of counseling services for both patient and caregiver.  Jane stated she understood and is feeling somewhat overwhelmed at the moment.  CSW to mail her additional program information.  Jane has no other questions or needs at this time.     Lynn M Duffy, LCSW  Clinical Social Worker [facility] 

## 2022-04-12 NOTE — Telephone Encounter (Signed)
Mailed letter for wig plus gift certificate to daughter's home address.

## 2022-04-12 NOTE — Progress Notes (Signed)
Social work referral added

## 2022-04-13 ENCOUNTER — Encounter (HOSPITAL_COMMUNITY): Payer: Self-pay

## 2022-04-15 ENCOUNTER — Telehealth: Payer: Self-pay | Admitting: Internal Medicine

## 2022-04-15 NOTE — Telephone Encounter (Signed)
Called patient per 11/8 in basket. Left patient voicemail

## 2022-04-19 ENCOUNTER — Ambulatory Visit: Payer: PPO | Admitting: Occupational Therapy

## 2022-04-19 ENCOUNTER — Encounter: Payer: Self-pay | Admitting: Physical Therapy

## 2022-04-19 ENCOUNTER — Encounter: Payer: Self-pay | Admitting: Occupational Therapy

## 2022-04-19 ENCOUNTER — Ambulatory Visit: Payer: PPO | Attending: Physical Medicine & Rehabilitation | Admitting: Physical Therapy

## 2022-04-19 DIAGNOSIS — M6281 Muscle weakness (generalized): Secondary | ICD-10-CM | POA: Diagnosis not present

## 2022-04-19 DIAGNOSIS — R2681 Unsteadiness on feet: Secondary | ICD-10-CM | POA: Insufficient documentation

## 2022-04-19 DIAGNOSIS — R262 Difficulty in walking, not elsewhere classified: Secondary | ICD-10-CM | POA: Diagnosis not present

## 2022-04-19 DIAGNOSIS — Z9181 History of falling: Secondary | ICD-10-CM | POA: Insufficient documentation

## 2022-04-19 NOTE — Therapy (Signed)
OUTPATIENT OCCUPATIONAL THERAPY NEURO EVALUATION  Patient Name: Lauren Hill MRN: [id number] DOB:05-04-1935, 87 y.o., female Today's Date: 04/19/2022  PCP: Burnard Bunting, MD REFERRING PROVIDER: Ventura Sellers, MD   OT End of Session - 04/19/22 1523     Visit Number 1    Number of Visits 1    Authorization Type [employer]    OT Start Time 39    OT Stop Time 2876    OT Time Calculation (min) 45 min    Activity Tolerance Patient tolerated treatment well    Behavior During Therapy WFL for tasks assessed/performed             Past Medical History:  Diagnosis Date   Arthritis    GERD (gastroesophageal reflux disease)    indigestion   History of blood transfusion    as an infant   History of hiatal hernia    History of pneumonia    Hyperlipidemia    Hypertension    Renal cyst    Past Surgical History:  Procedure Laterality Date   APPLICATION OF CRANIAL NAVIGATION Right 03/29/2022   Procedure: APPLICATION OF CRANIAL NAVIGATION;  Surgeon: Newman Pies, MD;  Location: [place];  Service: Neurosurgery;  Laterality: Right;   COLONOSCOPY     CRANIOTOMY Right 03/29/2022   Procedure: CRANIOTOMY TUMOR EXCISION;  Surgeon: Newman Pies, MD;  Location: [place];  Service: Neurosurgery;  Laterality: Right;   DILATION AND CURETTAGE OF UTERUS     x2   EYE SURGERY     cataracts   HIP SURGERY  5/05   right hip replacement   HIP SURGERY  5/11   left hip replacement   ORIF HUMERUS FRACTURE Left 11/11/2017   Procedure: OPEN REDUCTION INTERNAL FIXATION (ORIF) LEFT DISTAL HUMERUS FRACTURE;  Surgeon: Shona Needles, MD;  Location: [place];  Service: Orthopedics;  Laterality: Left;   RHINOPLASTY     TONSILLECTOMY     TOTAL SHOULDER ARTHROPLASTY Left 04/01/2016   Procedure: LEFT TOTAL SHOULDER ARTHROPLASTY;  Surgeon: Justice Britain, MD;  Location: [place];  Service: Orthopedics;  Laterality: Left;   Patient Active Problem List   Diagnosis Date Noted   High grade  glioma not classifiable by WHO criteria ([place]) 04/08/2022   Primary malignant glioma of temporal lobe ([place]) 03/29/2022   Syncope 03/23/2022   AKI (acute kidney injury) ([place]) 03/23/2022   Hyperglycemia 03/23/2022   Leukocytosis 03/23/2022   Essential hypertension 03/22/2014   Hyperlipidemia 03/22/2014   Microscopic hematuria 03/22/2014    ONSET DATE: 04/12/2022  REFERRING DIAG: right temporal high grade glioma  THERAPY DIAG:  Unsteadiness on feet - Plan: Ot plan of care cert/re-cert  Rationale for Evaluation and Treatment: Rehabilitation  SUBJECTIVE:   SUBJECTIVE STATEMENT: Pt reports arthritis. I start chemo and radiation after Thanksgiving Pt accompanied by: family member (sister and daughter)   PERTINENT HISTORY: 03/29/2022 Surgery: Craniotomy, resection of right temporal mass with Dr. Arnoldo Morale, OA, HOH, HTN  PRECAUTIONS: Fall and Other: HOH  WEIGHT BEARING RESTRICTIONS: No  PAIN:  Are you having pain?  Yes d/t OA (O.T. not addressing due to chronic in nature)   FALLS: Has patient fallen in last 6 months? No  LIVING ENVIRONMENT: Lives with: lives alone (sister lives close by) Lives in: House/apartment (townhouse) but bedroom and full bath on first floor, 3 steps to enter from outside w/ railings on both sides Has following equipment at home: Programmer, multimedia, Environmental consultant - 2 wheeled, shower chair, bed side commode, and  Grab bars  PLOF: Independent  PATIENT GOALS: get better  OBJECTIVE:   HAND DOMINANCE: Right  ADLs:  Eating: independent Grooming: independent   UB Dressing: difficulty w/ jackets d/t previous Lt shoulder sx and reporting Rt shoulder needs sx LB Dressing: mod I w/ slip on shoes Toileting: independent Bathing: mod I w/ grab bars - therapist recommends supervision Tub Shower transfers: recommends sup Equipment: Shower seat with back and Grab bars  IADLs: Shopping: family or friends currently doing Light housekeeping: housekeeper 2x/month for  heavier cleaning, does own laundry, washing dishes Meal Prep: heats up things in microwave, gets snacks (pt reports never cooked) eats out a lot prior but now family bringing meals Community mobility: currently relies on family/friends Medication management: daughter placing in weekly pillbox, then pt does after set up Financial management: pt doing Handwriting: 100% legible  MOBILITY STATUS:  using RW   UPPER EXTREMITY ROM:  BUE AROM WFL's w/ noted OA in hands   HAND FUNCTION: Grip strength: Right: 33.5 lbs; Left: 34.3 lbs  COORDINATION: 9 Hole Peg test: Right: 27.93 sec; Left: 34.59 sec  SENSATION: WFL Pt does report slight numbness in fingertips both hands   EDEMA: none in UE's   COGNITION: Overall cognitive status:  subtracting by 3's w/ 2 errors, delayed recall 3/3  VISION: Subjective report: denies changes Baseline vision: Wears glasses for reading only Visual history:  cataract sx, beginning of age related macular degeneration   PERCEPTION: Not tested  PRAXIS: Not tested  OBSERVATIONS: Pt/family do not have any O.T. concerns at this time. Pt's family reports slightly decreased balance which P.T. to address   TODAY'S TREATMENT:                                                                                                                              DATE: n/a    PATIENT EDUCATION: Education details: OT eval findings and possible need for O.T. after chemo/radiation Person educated: Patient, Child(ren), and sister Education method: Explanation Education comprehension: verbalized understanding  HOME EXERCISE PROGRAM: N/A   GOALS: N/A at this time   ASSESSMENT:  CLINICAL IMPRESSION: Patient is a 87 y.o. female who was seen today for occupational therapy evaluation s/p craniotomy, resection d/t glioma Rt temporal lobe. Pt does not currently have any O.T. deficits and reports she is back to baseline prior to surgery except driving (d/t seizure). Pt also  with mild strength deficits d/t OA, and previous Lt shoulder surgery and need for surgery to Rt shoulder; however this is not new but chronic.    MODIFICATION OR ASSISTANCE TO COMPLETE EVALUATION: No modification of tasks or assist necessary to complete an evaluation.  OT OCCUPATIONAL PROFILE AND HISTORY: Problem focused assessment: Including review of records relating to presenting problem.  CLINICAL DECISION MAKING: LOW - limited treatment options, no task modification necessary    EVALUATION COMPLEXITY: Low    PLAN:  OT FREQUENCY: one time visit   RECOMMENDED OTHER SERVICES:  None at this time - pt is currently seeing P.T. for mild balance deficits  CONSULTED AND AGREED WITH PLAN OF CARE: Patient and family member/caregiver  PLAN FOR NEXT SESSION: N/A.  Recommended that pt return as needed after chemo/radiation if decline in function. Pt/family verbalized understanding   Hans Eden, OT 04/19/2022, 3:23 PM

## 2022-04-19 NOTE — Addendum Note (Signed)
Addended by: Hunt Oris on: 04/19/2022 02:25 PM   Modules accepted: Orders

## 2022-04-19 NOTE — Therapy (Signed)
OUTPATIENT PHYSICAL THERAPY NEURO EVALUATION   Patient Name: Lauren Hill MRN: [id number] DOB:Feb 18, 1935, 87 y.o., female Today's Date: 04/19/2022   PCP: Burnard Bunting MD REFERRING PROVIDER: Ventura Sellers, MD   PT End of Session - 04/19/22 1421     Visit Number 1    Number of Visits 9    Date for PT Re-Evaluation 06/14/22    Authorization Type HTA    Authorization Time Period 04/19/22 to 06/14/22    Progress Note Due on Visit 10    PT Start Time 1318    PT Stop Time 8675    PT Time Calculation (min) 40 min    Activity Tolerance Patient tolerated treatment well;Patient limited by fatigue;Patient limited by pain   chronic OA knee pain   Behavior During Therapy WFL for tasks assessed/performed             Past Medical History:  Diagnosis Date   Arthritis    GERD (gastroesophageal reflux disease)    indigestion   History of blood transfusion    as an infant   History of hiatal hernia    History of pneumonia    Hyperlipidemia    Hypertension    Renal cyst    Past Surgical History:  Procedure Laterality Date   APPLICATION OF CRANIAL NAVIGATION Right 03/29/2022   Procedure: APPLICATION OF CRANIAL NAVIGATION;  Surgeon: Newman Pies, MD;  Location: [place];  Service: Neurosurgery;  Laterality: Right;   COLONOSCOPY     CRANIOTOMY Right 03/29/2022   Procedure: CRANIOTOMY TUMOR EXCISION;  Surgeon: Newman Pies, MD;  Location: [place];  Service: Neurosurgery;  Laterality: Right;   DILATION AND CURETTAGE OF UTERUS     x2   EYE SURGERY     cataracts   HIP SURGERY  5/05   right hip replacement   HIP SURGERY  5/11   left hip replacement   ORIF HUMERUS FRACTURE Left 11/11/2017   Procedure: OPEN REDUCTION INTERNAL FIXATION (ORIF) LEFT DISTAL HUMERUS FRACTURE;  Surgeon: Shona Needles, MD;  Location: [place];  Service: Orthopedics;  Laterality: Left;   RHINOPLASTY     TONSILLECTOMY     TOTAL SHOULDER ARTHROPLASTY Left 04/01/2016   Procedure: LEFT TOTAL  SHOULDER ARTHROPLASTY;  Surgeon: Justice Britain, MD;  Location: [place];  Service: Orthopedics;  Laterality: Left;   Patient Active Problem List   Diagnosis Date Noted   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 ([place]) 04/08/2022   Primary malignant glioma of temporal lobe ([place]) 03/29/2022   Syncope 03/23/2022   AKI (acute kidney injury) ([place]) 03/23/2022   Hyperglycemia 03/23/2022   Leukocytosis 03/23/2022   Essential hypertension 03/22/2014   Hyperlipidemia 03/22/2014   Microscopic hematuria 03/22/2014    ONSET DATE: 04/08/2022  REFERRING DIAG:  C71.9 (ICD-10-CM) - 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 Starting chemo/radiation here in town at [place] after Thanksgiving. I do have bad arthritis all over, I get shots in my back for it.  Per family balance is a concern (might be a little slower than usual); per family, getting up can be hard and she falls back in the chair. Also per family, Lauren Hill has always been someone who falls, mostly from clumsiness did break L UE in one fall and has a plate in L UE because of this.   Pt accompanied by: family member  PERTINENT HISTORY: History of Present Illness: The patient's records from the referring physician were obtained and reviewed and the patient interviewed to confirm this HPI.  Lauren Hill presented last month with episode of LOC while at church.  She was "out for short  time" then returned to normal shortly thereafter.  CNS imaging demonstrates large enhancing right temporal mass.  She underwent craniotomy, resection with Dr. Arnoldo Morale on 03/29/22; prelim path was high grade glioma pending final.  Since surgery, she complains of some issues with hearing, otherwise functionally intact.  Walking on her own with the walker, energy level is good.  Continues on Keppra '500mg'$  twice per day, Decadron '2mg'$  twice per day.  PAIN:  Are you having pain? No  PRECAUTIONS: Fall and Other: chemo starting after Thanksgiving   WEIGHT BEARING RESTRICTIONS: No  FALLS: Has patient fallen in last 6 months? No  LIVING ENVIRONMENT: Lives with: lives alone Lives in: Other townhouse  Stairs: 3STE B rails, flight upstairs inside but does not have to go upstairs Has following equipment at home: Quad cane small base, Walker - 2 wheeled, shower chair, and bed side commode  PLOF: Independent, Independent with basic ADLs, Independent with gait, Independent with transfers, and Requires assistive device for independence  PATIENT GOALS: improve balance, be more independent, build LE strength   OBJECTIVE:   DIAGNOSTIC FINDINGS: IMPRESSION: Status post right parietal craniotomy and resection of a previously noted posterior right temporal lobe mass. Enhancement about the resection cavity may be postsurgical but is concerning for residual tumor. Attention on follow-up.    COGNITION: Overall cognitive status: Impaired 1/3 on 3 word recall    SENSATION: Not tested  COORDINATION:   EDEMA:    MUSCLE TONE:   MUSCLE LENGTH:   DTRs:    POSTURE:   LOWER EXTREMITY ROM:     Active  Right Eval Left Eval  Hip flexion    Hip extension    Hip abduction    Hip adduction    Hip internal rotation    Hip external rotation    Knee flexion    Knee extension    Ankle dorsiflexion    Ankle plantarflexion    Ankle inversion    Ankle eversion     (Blank rows = not  tested)  LOWER EXTREMITY MMT:    MMT Right Eval Left Eval  Hip flexion 3 3  Hip extension    Hip abduction 3+ 4  Hip adduction    Hip internal rotation    Hip external rotation    Knee flexion 4+ 4+  Knee extension 4+ 4+  Ankle dorsiflexion 3+ 3+  Ankle plantarflexion    Ankle inversion    Ankle eversion    (Blank rows = not tested)  BED MOBILITY:  Sit to supine Complete Independence Supine to sit Complete  Independence Rolling to Right Complete Independence Rolling to Left Complete Independence  TRANSFERS: Assistive device utilized: Environmental consultant - 2 wheeled  Sit to stand: SBA Stand to sit: SBA Chair to chair: SBA Floor:   RAMP:    CURB:    STAIRS: Level of Assistance:  Stair Negotiation Technique:  with  Number of Stairs:   Height of Stairs:   Comments: DNT   GAIT: Gait pattern: step through pattern, decreased ankle dorsiflexion- Right, knee flexed in stance- Right, knee flexed in stance- Left, trendelenburg, trunk flexed, and narrow BOS Distance walked: 370f Assistive device utilized: WEnvironmental consultant- 2 wheeled Level of assistance: Modified independence Comments: easily fatigued   FUNCTIONAL TESTS:  3 minute walk test: 3494fRW Berg Balance Scale: 34/56 5xSTS 12 seconds use of BUEs  Gait speed 1.67f50fec with RW   PATIENT SURVEYS:    TODAY'S TREATMENT:                                                                                                                              DATE:   Eval  Eval + appropriate education  TherEx  Bridges x10 2 second hold  Supine clams red TB x10 3 second hold  Standing ankle DF x10 in RW Standing hip ABD x10 in RW no resistance       PATIENT EDUCATION: Education details: exam findings, POC, HEP; extensive education on possible effects of chemo including increased fatigue and reduced tolerance to activity, POC to try PT at 1x/week after chemo starts- if she tolerates chemo tx well will increase frequency, if she does  not tolerate them well we may need to refer to HHPB[facility]tonerson educated: Patient and Child(ren), sister  Education method: Explanation, Demonstration, and Handouts Education comprehension: verbalized understanding, returned demonstration, and needs further education  HOME EXERCISE PROGRAM:  [id number]: Goals reviewed with patient? Yes  SHORT TERM GOALS: Target date: 05/03/2022  Will be compliant with appropriate progressive HEP  Baseline: Goal status: INITIAL  2.  Will be able to name 3 ways to reduce fall risk at home and in the community  Baseline:  Goal status: INITIAL  3.  Will be able to name 3 ways to conserve energy in order to prepare for possible increased fatigue/malaise from chemo/radiation treatments Baseline:  Goal status: INITIAL  4.  Will be compliant with regular walking program (at least 5 minutes of walking/hour when at home) Baseline:  Goal status: INITIAL   LONG TERM GOALS: Target date: 06/14/2022  MMT to improve by at least 1 grade in all weak groups  Baseline:  Goal status: INITIAL  2.  Will be able to ambulate 500f7f 3MWT to show improved functional activity tolerance and ability to access community  Baseline:  Goal status: INITIAL  3.  Will score at least 44/56 on Berg to show reduced fall risk  Baseline:  Goal status: INITIAL  4.  Will be compliant with advanced HEP to maintain  functional gains and prevent decline after DC from PT  Baseline:  Goal status: INITIAL   ASSESSMENT:  CLINICAL IMPRESSION: Patient is a 87 y.o. F who was seen today for physical therapy evaluation and treatment for skilled PT care for glioma. She did undergo right parietal craniotomy and resection of a previously noted posterior right temporal lobe mass on 03/29/22 and is scheduled to start chemo/radiation on 05/03/22. Exam reveals significant impairment in strength, balance, functional activity tolerance, and general safety awareness. Unfortunately time is a bit  limited before she starts her treatments- will focus on pre-chemo/radiation education and exercise for the next 2 sessions, after that we will drop to 1x/week until we can assess her tolerance to chemo/radiation and appropriateness for OP PT at that time.   OBJECTIVE IMPAIRMENTS: decreased activity tolerance, decreased balance, decreased coordination, decreased knowledge of use of DME, decreased mobility, difficulty walking, decreased strength, and decreased safety awareness.   ACTIVITY LIMITATIONS: standing, squatting, stairs, transfers, hygiene/grooming, and locomotion level  PARTICIPATION LIMITATIONS: cleaning, laundry, shopping, community activity, yard work, church   PERSONAL FACTORS: Age, Education, Fitness, Past/current experiences, and Time since onset of injury/illness/exacerbation are also affecting patient's functional outcome.   REHAB POTENTIAL: Fair starting chemo and radiation soon/unsure of tolerance to these treatments and possible effects on tolerance to PT   CLINICAL DECISION MAKING: Evolving/moderate complexity  EVALUATION COMPLEXITY: Moderate  PLAN:  PT FREQUENCY:  2 visits before chemo starts on 05/03/22, 1x/week for 6 weeks after that date   PT DURATION: 8 weeks  PLANNED INTERVENTIONS: Therapeutic exercises, Therapeutic activity, Neuromuscular re-education, Balance training, Gait training, Patient/Family education, Self Care, Joint mobilization, Stair training, DME instructions, Aquatic Therapy, Ultrasound, Manual therapy, and Re-evaluation  PLAN FOR NEXT SESSION: pre-chemo/radiation focus on building out HEP and appropriate education about fatigue/energy conservation/safety/etc; after chemo starts, strength/balance functional activity tolerance as able but may need to reassess POC depending on how she tolerates those treatments    Takeyla Million U PT DPT PN2  04/19/2022, 2:23 PM

## 2022-04-22 ENCOUNTER — Inpatient Hospital Stay: Payer: PPO

## 2022-04-22 ENCOUNTER — Other Ambulatory Visit: Payer: Self-pay

## 2022-04-22 ENCOUNTER — Other Ambulatory Visit (HOSPITAL_COMMUNITY): Payer: Self-pay

## 2022-04-22 ENCOUNTER — Ambulatory Visit
Admission: RE | Admit: 2022-04-22 | Discharge: 2022-04-22 | Disposition: A | Discharge status: Home / Self Care | Payer: PPO | Source: Ambulatory Visit | Attending: Radiation Oncology | Admitting: Radiation Oncology

## 2022-04-22 ENCOUNTER — Telehealth: Payer: Self-pay | Admitting: Pharmacist

## 2022-04-22 ENCOUNTER — Encounter: Payer: Self-pay | Admitting: Physical Therapy

## 2022-04-22 ENCOUNTER — Encounter: Payer: Self-pay | Admitting: Internal Medicine

## 2022-04-22 ENCOUNTER — Other Ambulatory Visit: Payer: Self-pay | Admitting: Internal Medicine

## 2022-04-22 ENCOUNTER — Telehealth: Payer: Self-pay | Admitting: Pharmacy Technician

## 2022-04-22 ENCOUNTER — Ambulatory Visit: Payer: PPO | Admitting: Physical Therapy

## 2022-04-22 DIAGNOSIS — Z510 Encounter for antineoplastic radiation therapy: Secondary | ICD-10-CM | POA: Insufficient documentation

## 2022-04-22 DIAGNOSIS — C712 Malignant neoplasm of temporal lobe: Secondary | ICD-10-CM | POA: Insufficient documentation

## 2022-04-22 DIAGNOSIS — R2681 Unsteadiness on feet: Secondary | ICD-10-CM

## 2022-04-22 DIAGNOSIS — M6281 Muscle weakness (generalized): Secondary | ICD-10-CM

## 2022-04-22 DIAGNOSIS — R262 Difficulty in walking, not elsewhere classified: Secondary | ICD-10-CM

## 2022-04-22 DIAGNOSIS — C719 Malignant neoplasm of brain, unspecified: Secondary | ICD-10-CM

## 2022-04-22 DIAGNOSIS — Z9181 History of falling: Secondary | ICD-10-CM

## 2022-04-22 MED ORDER — TEMOZOLOMIDE 140 MG PO CAPS
140.0000 mg | ORAL_CAPSULE | Freq: Every day | ORAL | 0 refills | Status: DC
  Filled 2022-04-22 – 2022-04-26 (×4): qty 42, 42d supply, fill #0

## 2022-04-22 MED ORDER — ONDANSETRON HCL 8 MG PO TABS
8.0000 mg | ORAL_TABLET | Freq: Three times a day (TID) | ORAL | 1 refills | Status: DC | PRN
  Filled 2022-04-22: qty 30, 10d supply, fill #0

## 2022-04-22 NOTE — Telephone Encounter (Signed)
Oral Oncology Pharmacist Encounter  Received new prescription for Temodar (temozolomide) for the treatment of glioblastoma, IDH-wild type in conjunction with radiation, planned duration 42 days.  BMP and CBC w/ Diff from 03/24/22 assessed, no relevant lab abnormalities requiring baseline dose adjustment required at this time. Prescription dose and frequency assessed for appropriateness.   Current medication list in Epic reviewed, no relevant/significant DDIs with Temodar identified.  Evaluated chart and no patient barriers to medication adherence noted.   Patient agreement for treatment documented in MD note on 04/08/22.  Prescription has been e-scribed to the [facility] for benefits analysis and approval.  Oral Oncology Clinic will continue to follow for insurance authorization, copayment issues, initial counseling and start date.  Leron Croak, PharmD, BCPS, [facility] Hematology/Oncology Clinical Pharmacist [facility] and [place] [phone] 04/22/2022 12:08 PM

## 2022-04-22 NOTE — Progress Notes (Signed)

## 2022-04-22 NOTE — Telephone Encounter (Signed)
Oral Oncology Patient Advocate Encounter   Received notification that prior authorization for Temozolomide is required.   PA submitted on 04/22/22 Key [id number] Status is pending     Lady Deutscher, CPhT-Adv Oncology Pharmacy Patient [place] Direct Number: [phone]  Fax: [phone]

## 2022-04-22 NOTE — Therapy (Signed)
OUTPATIENT PHYSICAL THERAPY NEURO TREATMENT    Patient Name: Lauren Hill MRN: [id number] DOB:1935/04/04, 87 y.o., female Today's Date: 04/22/2022   PCP: Burnard Bunting MD REFERRING PROVIDER: Ventura Sellers, MD   PT End of Session - 04/22/22 1414     Visit Number 2    Number of Visits 9    Date for PT Re-Evaluation 06/14/22    Authorization Type HTA    Authorization Time Period 04/19/22 to 06/14/22    Progress Note Due on Visit 10    PT Start Time 1400    PT Stop Time 7412    PT Time Calculation (min) 42 min    Activity Tolerance Patient tolerated treatment well;Patient limited by fatigue    Behavior During Therapy [facility] for tasks assessed/performed              Past Medical History:  Diagnosis Date   Arthritis    GERD (gastroesophageal reflux disease)    indigestion   History of blood transfusion    as an infant   History of hiatal hernia    History of pneumonia    Hyperlipidemia    Hypertension    Renal cyst    Past Surgical History:  Procedure Laterality Date   APPLICATION OF CRANIAL NAVIGATION Right 03/29/2022   Procedure: APPLICATION OF CRANIAL NAVIGATION;  Surgeon: Newman Pies, MD;  Location: [place];  Service: Neurosurgery;  Laterality: Right;   COLONOSCOPY     CRANIOTOMY Right 03/29/2022   Procedure: CRANIOTOMY TUMOR EXCISION;  Surgeon: Newman Pies, MD;  Location: [place];  Service: Neurosurgery;  Laterality: Right;   DILATION AND CURETTAGE OF UTERUS     x2   EYE SURGERY     cataracts   HIP SURGERY  5/05   right hip replacement   HIP SURGERY  5/11   left hip replacement   ORIF HUMERUS FRACTURE Left 11/11/2017   Procedure: OPEN REDUCTION INTERNAL FIXATION (ORIF) LEFT DISTAL HUMERUS FRACTURE;  Surgeon: Shona Needles, MD;  Location: [place];  Service: Orthopedics;  Laterality: Left;   RHINOPLASTY     TONSILLECTOMY     TOTAL SHOULDER ARTHROPLASTY Left 04/01/2016   Procedure: LEFT TOTAL SHOULDER ARTHROPLASTY;  Surgeon: Justice Britain,  MD;  Location: [place];  Service: Orthopedics;  Laterality: Left;   Patient Active Problem List   Diagnosis Date Noted   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 ([place]) 04/08/2022   Primary malignant glioma of temporal lobe ([name]) 03/29/2022   Syncope 03/23/2022   AKI (acute kidney injury) ([place]) 03/23/2022   Hyperglycemia 03/23/2022   Leukocytosis 03/23/2022   Essential hypertension 03/22/2014   Hyperlipidemia 03/22/2014   Microscopic hematuria 03/22/2014    ONSET DATE: 04/08/2022  REFERRING DIAG:  C71.9 (ICD-10-CM) - 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 Today I had to have a face mask done it was fun but it took some energy. Yesterday was my birthday and I had a lot company over so it was hard for me to get to them, I'll try to be better about them in the future. My back hurts today.    Pt accompanied by: family member  PERTINENT HISTORY: History of Present Illness: The patient's records from the referring physician were obtained and reviewed and the patient interviewed to confirm this HPI.  Lauren Hill presented last month with episode of LOC while at church.  She was "out for short time" then returned to normal shortly thereafter.  CNS imaging demonstrates large enhancing right temporal mass.  She underwent craniotomy, resection with Dr. Arnoldo Morale on 03/29/22; prelim path was high grade glioma pending final.  Since surgery, she complains of some issues with hearing, otherwise  functionally intact.  Walking on her own with the walker, energy level is good.  Continues on Keppra '500mg'$  twice per day, Decadron '2mg'$  twice per day.  PAIN:  Are you having pain? Yes: NPRS scale: 5/10 Pain location: middle of my low back Pain description: nagging  Aggravating factors: nothing  Relieving factors: ice   PRECAUTIONS: Fall and Other: chemo starting after Thanksgiving   WEIGHT BEARING RESTRICTIONS: No  FALLS: Has patient fallen in last 6 months? No  LIVING ENVIRONMENT: Lives with: lives alone Lives in: Other townhouse  Stairs: 3STE B rails, flight upstairs inside but does not have to go upstairs Has following equipment at home: Quad cane small base, Walker - 2 wheeled, shower chair, and bed side commode  PLOF: Independent, Independent with basic ADLs, Independent with gait, Independent with transfers, and Requires assistive device for independence  PATIENT GOALS: improve balance, be more independent, build LE strength   OBJECTIVE:   DIAGNOSTIC FINDINGS: IMPRESSION: Status post right parietal craniotomy and resection of a previously noted posterior right temporal lobe mass. Enhancement about the resection cavity may be postsurgical but is concerning for residual tumor. Attention on follow-up.    COGNITION: Overall cognitive status: Impaired 1/3 on 3 word recall    SENSATION: Not tested  COORDINATION:   EDEMA:    MUSCLE TONE:   MUSCLE LENGTH:   DTRs:    POSTURE:   LOWER EXTREMITY ROM:     Active  Right Eval Left Eval  Hip flexion    Hip extension    Hip abduction    Hip adduction    Hip internal rotation    Hip external rotation    Knee flexion    Knee extension    Ankle dorsiflexion    Ankle plantarflexion    Ankle inversion    Ankle eversion     (Blank rows = not tested)  LOWER EXTREMITY MMT:    MMT Right Eval Left Eval  Hip flexion 3 3  Hip extension    Hip abduction 3+ 4  Hip adduction    Hip internal rotation    Hip  external rotation    Knee flexion 4+ 4+  Knee extension 4+ 4+  Ankle dorsiflexion 3+ 3+  Ankle plantarflexion    Ankle inversion    Ankle eversion    (Blank rows = not tested)  BED MOBILITY:  Sit to supine Complete Independence Supine to sit Complete Independence Rolling to Right Complete Independence Rolling to Left Complete Independence  TRANSFERS: Assistive device utilized: Environmental consultant - 2 wheeled  Sit to stand: SBA Stand to sit: SBA Chair to chair: SBA Floor:   RAMP:    CURB:    STAIRS: Level of Assistance:  Stair Negotiation Technique:  with  Number of Stairs:   Height of Stairs:   Comments: DNT   GAIT: Gait pattern: step through pattern, decreased ankle dorsiflexion- Right, knee flexed in stance- Right, knee flexed in stance- Left, trendelenburg, trunk flexed, and narrow BOS Distance walked: 372f Assistive device utilized: WEnvironmental consultant- 2 wheeled Level of assistance: Modified independence Comments: easily fatigued   FUNCTIONAL TESTS:  3 minute walk test: 34158fRW Berg Balance Scale: 34/56 5xSTS 12 seconds use of BUEs  Gait speed 1.58f57fec with RW   PATIENT SURVEYS:    TODAY'S TREATMENT:                                                                                                                              DATE:   04/22/22  TherEx  Bridges x10 2 second holds  Supine clams red TB x10 with 3 second holds  Standing ankle DF x10 with RW Standing hip ABD x10 B with RW no resistance Squats in front of standard chair x10 no UEs Hip hikes x10 B Mod cues  Nustep L3 x5 minutes BLEs only (goal 40SPM, able to maintain at 50-60SPM), seat 8  NMR  Static stance on blue foam pad MinA for balance 3x30 seconds Tandem stance 2x30 seconds B solid surface MinA    Eval  Eval + appropriate education  TherEx  Bridges x10 2 second hold  Supine clams red TB x10 3 second hold  Standing ankle DF x10 in RW Standing hip ABD x10 in RW no resistance       PATIENT  EDUCATION: Education details: continued to encourage safe transfers, reviewed possible effects of chemo such as increased fatigue and numbness in LEs/hands; energy conservation strategies in the home and community; strategic placement of chairs at home for "rest stops" when she is very fatigued  Person educated: Patient and Child(ren), sister  Education method: Explanation, Demonstration, and Handouts Education comprehension: verbalized understanding, returned demonstration, and needs further education  HOME EXERCISE PROGRAM:  [id number]: Goals reviewed with patient? Yes  SHORT TERM GOALS: Target date: 05/03/2022  Will be compliant with appropriate progressive HEP  Baseline: Goal status: INITIAL  2.  Will be able to name 3 ways to reduce fall risk at home and in the community  Baseline:  Goal status: INITIAL  3.  Will be able to name 3 ways to conserve energy in order to prepare for possible increased fatigue/malaise from chemo/radiation treatments Baseline:  Goal status: INITIAL  4.  Will be compliant with regular walking program (at least 5 minutes of walking/hour when at home) Baseline:  Goal status: INITIAL   LONG TERM GOALS: Target date: 06/14/2022  MMT to improve by at least 1 grade in all weak groups  Baseline:  Goal status: INITIAL  2.  Will be able to ambulate 538f in 3MWT to show improved functional activity tolerance and ability to access community  Baseline:  Goal status: INITIAL  3.  Will score at least 44/56 on Berg to show reduced fall risk  Baseline:  Goal status: INITIAL  4.  Will be compliant with advanced HEP to maintain functional gains and prevent decline after DC from PT  Baseline:  Goal status: INITIAL   ASSESSMENT:  CLINICAL IMPRESSION:  Ms. PRiderarrives today fairly fatigued, asking for a lighter day today. We reviewed HEP and did all activities as tolerated, especially to get max benefit from PT sessions prior to chemo/radiation  starting. Lots of education provided today too especially regarding energy conservation and possible effects of chemo/radiation on energy levels, numbness in LEs/hands, etc. Did not expand HEP since she has not started it at home yet, will plan to do this next session when she is hopefully more consistent.    OBJECTIVE IMPAIRMENTS: decreased activity tolerance, decreased balance, decreased coordination, decreased knowledge of use of DME, decreased mobility, difficulty walking, decreased strength, and decreased safety awareness.   ACTIVITY LIMITATIONS: standing, squatting, stairs, transfers, hygiene/grooming, and locomotion level  PARTICIPATION LIMITATIONS: cleaning, laundry, shopping, community activity, yard work, church   PERSONAL FACTORS: Age, Education, Fitness, Past/current experiences, and Time since onset of injury/illness/exacerbation are also affecting patient's functional outcome.   REHAB POTENTIAL: Fair starting chemo and radiation soon/unsure of tolerance to these treatments and possible effects on tolerance to PT   CLINICAL DECISION MAKING: Evolving/moderate complexity  EVALUATION COMPLEXITY: Moderate  PLAN:  PT FREQUENCY:  2 visits before chemo starts on 05/03/22, 1x/week for 6 weeks after that date   PT DURATION: 8 weeks  PLANNED INTERVENTIONS: Therapeutic exercises, Therapeutic activity, Neuromuscular re-education, Balance training, Gait training, Patient/Family education, Self Care, Joint mobilization, Stair training, DME instructions, Aquatic Therapy, Ultrasound, Manual therapy, and Re-evaluation  PLAN FOR NEXT SESSION: pre-chemo/radiation focus on building out HEP and appropriate education about fatigue/energy conservation/safety/etc; after chemo starts, strength/balance functional activity tolerance as able but may need to reassess POC depending on how she tolerates those treatments    Yarethzi Branan U PT DPT PN2  04/22/2022, 2:43 PM

## 2022-04-23 ENCOUNTER — Encounter: Payer: Self-pay | Admitting: Internal Medicine

## 2022-04-23 ENCOUNTER — Other Ambulatory Visit (HOSPITAL_COMMUNITY): Payer: Self-pay

## 2022-04-23 NOTE — Telephone Encounter (Signed)
Oral Oncology Patient Advocate Encounter  Prior Authorization for Temozolomide has been approved through Medicare B  PA# [id number] Effective dates: 04/23/22 through 06/07/23  Patients co-pay is $325.02.    Lady Deutscher, CPhT-Adv Oncology Pharmacy Patient [place] Direct Number: [phone]  Fax: [phone]

## 2022-04-24 ENCOUNTER — Other Ambulatory Visit: Payer: Self-pay

## 2022-04-26 ENCOUNTER — Telehealth: Payer: Self-pay | Admitting: *Deleted

## 2022-04-26 ENCOUNTER — Encounter: Payer: Self-pay | Admitting: Physical Therapy

## 2022-04-26 ENCOUNTER — Ambulatory Visit: Payer: PPO | Admitting: Physical Therapy

## 2022-04-26 ENCOUNTER — Other Ambulatory Visit (HOSPITAL_COMMUNITY): Payer: Self-pay

## 2022-04-26 ENCOUNTER — Other Ambulatory Visit: Payer: Self-pay | Admitting: *Deleted

## 2022-04-26 DIAGNOSIS — M6281 Muscle weakness (generalized): Secondary | ICD-10-CM

## 2022-04-26 DIAGNOSIS — R2681 Unsteadiness on feet: Secondary | ICD-10-CM

## 2022-04-26 DIAGNOSIS — Z9181 History of falling: Secondary | ICD-10-CM

## 2022-04-26 DIAGNOSIS — R262 Difficulty in walking, not elsewhere classified: Secondary | ICD-10-CM

## 2022-04-26 NOTE — Telephone Encounter (Signed)
Patients daughter called to confirm when oral chemo would be ready.  Per [facility] outpatient available to pick up today.  Provided upcoming appointment times to daughter for week 2, 4, and 6.    Daughter also wanted to express concern for patient's overall health.  She states that at the instruction of Dr Mickeal Skinner they stopped the Decadron and since stopping that patient's chronic back pain has returned and is extremely limiting her quality of life.  She usually gets a steroid injection in her back but it only helps for approximately 1 week.  States that mother felt so much better and was able to enjoy her life when she was on the oral steroid.    Discussed with Dr Mickeal Skinner, based on quality of life determinents he advised to restart Decadron 2 mg once daily.    Communicated this to patients daughter.

## 2022-04-26 NOTE — Telephone Encounter (Signed)
error 

## 2022-04-26 NOTE — Progress Notes (Signed)
No need to order Decadron 2 mg at this time as patient has a lot on hand from order from Dr Arnoldo Morale.

## 2022-04-26 NOTE — Therapy (Signed)
OUTPATIENT PHYSICAL THERAPY NEURO TREATMENT    Patient Name: Lauren Hill MRN: [id number] DOB:May 25, 1935, 87 y.o., female Today's Date: 04/26/2022   PCP: Burnard Bunting MD REFERRING PROVIDER: Ventura Sellers, MD   PT End of Session - 04/26/22 1452     Visit Number 3    Number of Visits 9    Date for PT Re-Evaluation 06/14/22    Authorization Type HTA    Authorization Time Period 04/19/22 to 06/14/22    Progress Note Due on Visit 10    PT Start Time 1450    PT Stop Time 1529    PT Time Calculation (min) 39 min    Equipment Utilized During Treatment Gait belt    Activity Tolerance Patient tolerated treatment well;Patient limited by fatigue    Behavior During Therapy WFL for tasks assessed/performed              Past Medical History:  Diagnosis Date   Arthritis    GERD (gastroesophageal reflux disease)    indigestion   History of blood transfusion    as an infant   History of hiatal hernia    History of pneumonia    Hyperlipidemia    Hypertension    Renal cyst    Past Surgical History:  Procedure Laterality Date   APPLICATION OF CRANIAL NAVIGATION Right 03/29/2022   Procedure: APPLICATION OF CRANIAL NAVIGATION;  Surgeon: Newman Pies, MD;  Location: [facility];  Service: Neurosurgery;  Laterality: Right;   COLONOSCOPY     CRANIOTOMY Right 03/29/2022   Procedure: CRANIOTOMY TUMOR EXCISION;  Surgeon: Newman Pies, MD;  Location: [facility];  Service: Neurosurgery;  Laterality: Right;   DILATION AND CURETTAGE OF UTERUS     x2   EYE SURGERY     cataracts   HIP SURGERY  5/05   right hip replacement   HIP SURGERY  5/11   left hip replacement   ORIF HUMERUS FRACTURE Left 11/11/2017   Procedure: OPEN REDUCTION INTERNAL FIXATION (ORIF) LEFT DISTAL HUMERUS FRACTURE;  Surgeon: Shona Needles, MD;  Location: [facility];  Service: Orthopedics;  Laterality: Left;   RHINOPLASTY     TONSILLECTOMY     TOTAL SHOULDER ARTHROPLASTY Left 04/01/2016   Procedure: LEFT  TOTAL SHOULDER ARTHROPLASTY;  Surgeon: Justice Britain, MD;  Location: [facility];  Service: Orthopedics;  Laterality: Left;   Patient Active Problem List   Diagnosis Date Noted   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 ([place]) 04/08/2022   Primary malignant glioma of temporal lobe ([place]) 03/29/2022   Syncope 03/23/2022   AKI (acute kidney injury) ([place]) 03/23/2022   Hyperglycemia 03/23/2022   Leukocytosis 03/23/2022   Essential hypertension 03/22/2014   Hyperlipidemia 03/22/2014   Microscopic hematuria 03/22/2014    ONSET DATE: 04/08/2022  REFERRING DIAG:  C71.9 (ICD-10-CM) - 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 Has been trying to do some walking with the RW. Starts chemo/radiation right after thanksgiving. Will call afterwards regarding scheduling with chemo.    Pt accompanied by: family member  PERTINENT HISTORY: History of Present Illness: The patient's records from the referring physician were obtained and reviewed and the patient interviewed to confirm this HPI.  [name] presented last month with episode of LOC while at church.  She was "out for short time" then returned to normal shortly thereafter.  CNS imaging demonstrates large enhancing right temporal mass.  She underwent craniotomy, resection with Dr. Arnoldo Morale on 03/29/22; prelim path was high grade glioma pending final.  Since surgery, she complains of some issues with  hearing, otherwise functionally intact.  Walking on her own with the walker, energy level is good.  Continues on Keppra '500mg'$  twice per day, Decadron '2mg'$  twice per day.  PAIN:  Are you having pain? Yes: NPRS scale: 7-8/10 Pain location: both knees Pain description: "it just hurts" Aggravating factors: nothing  Relieving factors: put gel on both knees   6/10 low back pain. "I'm just full of arthritis"   PRECAUTIONS: Fall and Other: chemo starting after Thanksgiving   WEIGHT BEARING RESTRICTIONS: No  FALLS: Has patient fallen in last 6 months? No  LIVING ENVIRONMENT: Lives with: lives alone Lives in: Other townhouse  Stairs: 3STE B rails, flight upstairs inside but does not have to go upstairs Has following equipment at home: Lobbyist, Environmental consultant - 2 wheeled, shower chair, and bed side commode  PLOF: Independent, Independent with basic ADLs, Independent with gait, Independent with transfers, and Requires assistive device for independence  PATIENT GOALS: improve balance, be more independent, build LE strength   OBJECTIVE:   GAIT: Gait pattern: step through pattern, decreased ankle dorsiflexion- Right, knee flexed in stance- Right, knee flexed in stance- Left, trendelenburg, trunk flexed, and narrow BOS Distance walked: In and out of session.  Assistive device utilized: Environmental consultant - 2 wheeled Level of assistance: Modified independence Comments: easily fatigued    TODAY'S TREATMENT:                                                                                                                              DATE:   04/26/22   TherEx SciFit with BUE/BLE for strengthening, ROM, activity tolerance at gear 2.0 for 9 minutes. Starting at 6 minutes, pt using BLE only.   NMR: On air ex: -10 reps sit <> stands with UE support and working on immediate standing balance, with focus on eccentric controlling without plopping -Standing with wide BOS, ball toss with 2nd PT working on  standing tolerance and balance x30 reps, pt losing balance backwards at times onto the mat table.  -With feet hip width distance: 10 reps head turns, 10 reps head nods -With BUE support  x10 reps alternating marching for SLS, 10 reps heel toe raises for A/P weight shifting    PATIENT EDUCATION: Education details: Fall prevention handout, calling back to schedule more appts after pt begins chemo/radiation, continuing with HEP as she is able.  Person educated: Patient and Child(ren), sister  Education method: Explanation, Demonstration, and Handouts Education comprehension: verbalized understanding and returned demonstration  HOME EXERCISE PROGRAM:  [id number]  GOALS: Goals reviewed with patient? Yes  SHORT TERM GOALS: Target date: 05/03/2022  Will be compliant with appropriate progressive HEP  Baseline: Goal status: INITIAL  2.  Will be able to name 3 ways to reduce fall risk at home and in the community  Baseline:  Goal status: INITIAL  3.  Will be able to name 3 ways to conserve energy in order to prepare for possible increased fatigue/malaise from chemo/radiation treatments Baseline:  Goal status: INITIAL  4.  Will be compliant with regular walking program (at least 5 minutes of walking/hour when at home) Baseline:  Goal status: INITIAL   LONG TERM GOALS: Target date: 06/14/2022  MMT to improve by at least 1 grade in all weak groups  Baseline:  Goal status: INITIAL  2.  Will be able to ambulate 543f in 3MWT to show improved functional activity tolerance and ability to access community  Baseline:  Goal status: INITIAL  3.  Will score at least 44/56 on Berg to show reduced fall risk  Baseline:  Goal status: INITIAL  4.  Will be compliant with advanced HEP to maintain functional gains and prevent decline after DC from PT  Baseline:  Goal status: INITIAL   ASSESSMENT:  CLINICAL IMPRESSION:  Today's skilled session focused on BLE strengthening and standing balance  strategies on air ex. Pt needing intermittent rest breaks due to fatigue, but overall able to tolerate session well. Pt will plan to schedule more appts after she begins chemo/radiation and see how she feels. Will continue to progress towards LTGs.    OBJECTIVE IMPAIRMENTS: decreased activity tolerance, decreased balance, decreased coordination, decreased knowledge of use of DME, decreased mobility, difficulty walking, decreased strength, and decreased safety awareness.   ACTIVITY LIMITATIONS: standing, squatting, stairs, transfers, hygiene/grooming, and locomotion level  PARTICIPATION LIMITATIONS: cleaning, laundry, shopping, community activity, yard work, church   PERSONAL FACTORS: Age, Education, Fitness, Past/current experiences, and Time since onset of injury/illness/exacerbation are also affecting patient's functional outcome.   REHAB POTENTIAL: Fair starting chemo and radiation soon/unsure of tolerance to these treatments and possible effects on tolerance to PT   CLINICAL DECISION MAKING: Evolving/moderate complexity  EVALUATION COMPLEXITY: Moderate  PLAN:  PT FREQUENCY:  2 visits before chemo starts on 05/03/22, 1x/week for 6 weeks after that date   PT DURATION: 8 weeks  PLANNED INTERVENTIONS: Therapeutic exercises, Therapeutic activity, Neuromuscular re-education, Balance training, Gait training, Patient/Family education, Self Care, Joint mobilization, Stair training, DME instructions, Aquatic Therapy, Ultrasound, Manual therapy, and Re-evaluation  PLAN FOR NEXT SESSION: pre-chemo/radiation focus on building out HEP and appropriate education about fatigue/energy conservation/safety/etc; after chemo starts, strength/balance functional activity tolerance as able but may need to reassess POC depending on how she tolerates those treatments   CJanann August PT, DPT 04/26/22 3:30 PM

## 2022-04-26 NOTE — Telephone Encounter (Signed)
Oral Chemotherapy Pharmacist Encounter  I spoke with patient's daughter, [name], for overview of: Temodar (temozolomide) for the treatment of glioblastoma, IDH-wild type in conjunction with radiation, planned duration 42 days.   Counseled on administration, dosing, side effects, monitoring, drug-food interactions, safe handling, storage, and disposal.  Patient will take Temodar 113m capsules, 1 capsule by mouth once daily, may take at bedtime and on an empty stomach to decrease nausea and vomiting.  Patient will take Temodar concurrent with radiation for 42 days straight.  Temodar start date: 05/02/22 PM Radiation start date: 05/03/22  Adverse effects include but are not limited to: nausea, vomiting, anorexia, GI upset, rash, and fatigue. Rare but serious adverse effects of pneumocystis pneumonia and secondary malignancy also discussed. PCP prophylaxis will not be initiated at this time, but may be added based on lymphocyte count in the future.  Prophylactic Zofran will not be used at initiation of concurrent phase, but will be initiated if nausea develops despite Temodar administration on an empty stomach and at bedtime. If this occurs, patient will take Zofran 8 mg tablet, 1 tablet by mouth 30-60 min prior to Temodar dose to help decrease N/V   Reviewed importance of keeping a medication schedule and plan for any missed doses. No barriers to medication adherence identified.  Medication reconciliation performed and medication/allergy list updated.  All questions answered.  Patient's daughter voiced understanding and appreciation.   Medication education handout and medication calendar placed in mail for patient and patient's family. Patient's family knows to call the office with questions or concerns. Oral Chemotherapy Clinic phone number provided.   RLeron Croak PharmD, BCPS, [facility] GermanHematology/Oncology Clinical Pharmacist WElvina Sidleand [phone]/20/2023 11:37 AM

## 2022-04-27 ENCOUNTER — Inpatient Hospital Stay: Payer: PPO

## 2022-04-28 DIAGNOSIS — Z510 Encounter for antineoplastic radiation therapy: Secondary | ICD-10-CM | POA: Diagnosis not present

## 2022-04-29 DIAGNOSIS — R402411 Glasgow coma scale score 13-15, in the field [EMT or ambulance]: Secondary | ICD-10-CM | POA: Diagnosis not present

## 2022-04-29 DIAGNOSIS — I672 Cerebral atherosclerosis: Secondary | ICD-10-CM | POA: Diagnosis not present

## 2022-04-29 DIAGNOSIS — T68XXXA Hypothermia, initial encounter: Secondary | ICD-10-CM | POA: Diagnosis not present

## 2022-04-29 DIAGNOSIS — R54 Age-related physical debility: Secondary | ICD-10-CM | POA: Diagnosis not present

## 2022-04-29 DIAGNOSIS — G038 Meningitis due to other specified causes: Secondary | ICD-10-CM | POA: Diagnosis not present

## 2022-04-29 DIAGNOSIS — D72829 Elevated white blood cell count, unspecified: Secondary | ICD-10-CM | POA: Diagnosis not present

## 2022-04-29 DIAGNOSIS — T8140XA Infection following a procedure, unspecified, initial encounter: Secondary | ICD-10-CM | POA: Diagnosis not present

## 2022-04-29 DIAGNOSIS — R509 Fever, unspecified: Secondary | ICD-10-CM | POA: Diagnosis not present

## 2022-04-29 DIAGNOSIS — R918 Other nonspecific abnormal finding of lung field: Secondary | ICD-10-CM | POA: Diagnosis not present

## 2022-04-29 DIAGNOSIS — G936 Cerebral edema: Secondary | ICD-10-CM | POA: Diagnosis not present

## 2022-04-29 DIAGNOSIS — R410 Disorientation, unspecified: Secondary | ICD-10-CM | POA: Diagnosis not present

## 2022-04-29 DIAGNOSIS — G4489 Other headache syndrome: Secondary | ICD-10-CM | POA: Diagnosis not present

## 2022-04-29 DIAGNOSIS — Z1152 Encounter for screening for COVID-19: Secondary | ICD-10-CM | POA: Diagnosis not present

## 2022-04-29 DIAGNOSIS — Z9889 Other specified postprocedural states: Secondary | ICD-10-CM | POA: Diagnosis not present

## 2022-04-29 DIAGNOSIS — Z79899 Other long term (current) drug therapy: Secondary | ICD-10-CM | POA: Diagnosis not present

## 2022-04-29 DIAGNOSIS — E785 Hyperlipidemia, unspecified: Secondary | ICD-10-CM | POA: Diagnosis not present

## 2022-04-29 DIAGNOSIS — A419 Sepsis, unspecified organism: Secondary | ICD-10-CM | POA: Diagnosis not present

## 2022-04-29 DIAGNOSIS — C712 Malignant neoplasm of temporal lobe: Secondary | ICD-10-CM | POA: Diagnosis not present

## 2022-04-29 DIAGNOSIS — K219 Gastro-esophageal reflux disease without esophagitis: Secondary | ICD-10-CM | POA: Diagnosis not present

## 2022-04-29 DIAGNOSIS — R4182 Altered mental status, unspecified: Secondary | ICD-10-CM | POA: Diagnosis not present

## 2022-04-29 DIAGNOSIS — Z792 Long term (current) use of antibiotics: Secondary | ICD-10-CM | POA: Diagnosis not present

## 2022-04-29 DIAGNOSIS — R519 Headache, unspecified: Secondary | ICD-10-CM | POA: Diagnosis not present

## 2022-04-29 DIAGNOSIS — I10 Essential (primary) hypertension: Secondary | ICD-10-CM | POA: Diagnosis not present

## 2022-04-29 DIAGNOSIS — C719 Malignant neoplasm of brain, unspecified: Secondary | ICD-10-CM | POA: Diagnosis not present

## 2022-04-29 DIAGNOSIS — R52 Pain, unspecified: Secondary | ICD-10-CM | POA: Diagnosis not present

## 2022-04-29 DIAGNOSIS — R835 Abnormal microbiological findings in cerebrospinal fluid: Secondary | ICD-10-CM | POA: Diagnosis not present

## 2022-04-29 DIAGNOSIS — G039 Meningitis, unspecified: Secondary | ICD-10-CM | POA: Diagnosis not present

## 2022-04-30 ENCOUNTER — Telehealth: Payer: Self-pay

## 2022-04-30 NOTE — Telephone Encounter (Signed)
This nurse received a message from this patients daughter, [name], stating that patient spiked a fever and is currently in [place] at [facility].  They still have not been able to find the source of the infection and she was advised by the ER doctor to contact this provider about postponing the radiation treatment that is scheduled to start on Monday 11/27.  Daughter is requesting to speak with the provider about the treatment plan.  This nurse reached out to daughter and advised that this information will be forwarded to the provider however he is out of the office today.  She acknowledged understanding and has no further questions.

## 2022-05-03 ENCOUNTER — Ambulatory Visit: Payer: PPO | Admitting: Radiation Oncology

## 2022-05-04 ENCOUNTER — Encounter: Payer: Self-pay | Admitting: Internal Medicine

## 2022-05-04 ENCOUNTER — Ambulatory Visit: Payer: PPO

## 2022-05-04 ENCOUNTER — Other Ambulatory Visit: Payer: Self-pay

## 2022-05-05 ENCOUNTER — Telehealth: Payer: Self-pay

## 2022-05-05 ENCOUNTER — Ambulatory Visit: Payer: PPO

## 2022-05-05 ENCOUNTER — Telehealth: Payer: Self-pay | Admitting: *Deleted

## 2022-05-05 NOTE — Telephone Encounter (Signed)
Daughter called requesting a call back from Dr. Mickeal Skinner regarding her mother's hospitalization for meningitis. Requesting a call back from Dr. Mickeal Skinner. Patient is hospitalized at [facility] in [place], 5th floor room 29.  In basket message sent to Dr. Mickeal Skinner Shelle Iron RN.

## 2022-05-05 NOTE — Telephone Encounter (Signed)
Received call from Dr Jenny Reichmann [facility].  He reports pt was admitted with presumably Bacterial Meningitis & being d/c tomorrow on IV ATB.  MD can be reached at [phone] if Dr Mickeal Skinner would like to speak with him.  Message to Dr Mickeal Skinner.

## 2022-05-06 ENCOUNTER — Telehealth: Payer: Self-pay | Admitting: *Deleted

## 2022-05-06 ENCOUNTER — Ambulatory Visit: Payer: PPO

## 2022-05-06 NOTE — Telephone Encounter (Signed)
Received call from Carolynn Sayers with [employer] to advise that Lauren Hill was able to get a [facility] to manage the PICC line and we no longer need to PICC dressing change visit here.   Daughter is aware.

## 2022-05-06 NOTE — Telephone Encounter (Signed)
Update from Pam with [employer].  [facility] is unable to get a home health agency to accept patient for nursing and picc line care.  We will get her set up here for PICC dressing change and labs for next Tuesday since she is already going to be here.  Pending lab orders and PICC instructions from [facility].

## 2022-05-06 NOTE — Telephone Encounter (Signed)
Spoke with daugther to set up visit for evaluation post hospitalization.  She is hopeful that patient will get discharged soon but hospital is having difficulty finding agency to work with PICC line.  I encouraged daughter to give Case Manager at [facility] my phone number and in meantime I reached out to Carolynn Sayers, RN with [facility] and she will get in contact with a coworker at [facility] to see if she can help with getting this set up.  Patient should complete IV ABX 05/13/2022  Dr Mickeal Skinner advised that Radiation will be delayed until he meets with patient and daughter.  [place] was made aware of change.

## 2022-05-07 ENCOUNTER — Other Ambulatory Visit: Payer: Self-pay

## 2022-05-07 ENCOUNTER — Other Ambulatory Visit (HOSPITAL_COMMUNITY): Payer: Self-pay

## 2022-05-07 ENCOUNTER — Ambulatory Visit: Payer: PPO

## 2022-05-08 DIAGNOSIS — A419 Sepsis, unspecified organism: Secondary | ICD-10-CM | POA: Diagnosis not present

## 2022-05-08 DIAGNOSIS — D72829 Elevated white blood cell count, unspecified: Secondary | ICD-10-CM | POA: Diagnosis not present

## 2022-05-08 DIAGNOSIS — Z96612 Presence of left artificial shoulder joint: Secondary | ICD-10-CM | POA: Diagnosis not present

## 2022-05-08 DIAGNOSIS — K219 Gastro-esophageal reflux disease without esophagitis: Secondary | ICD-10-CM | POA: Diagnosis not present

## 2022-05-08 DIAGNOSIS — Z452 Encounter for adjustment and management of vascular access device: Secondary | ICD-10-CM | POA: Diagnosis not present

## 2022-05-08 DIAGNOSIS — C719 Malignant neoplasm of brain, unspecified: Secondary | ICD-10-CM | POA: Diagnosis not present

## 2022-05-08 DIAGNOSIS — E785 Hyperlipidemia, unspecified: Secondary | ICD-10-CM | POA: Diagnosis not present

## 2022-05-08 DIAGNOSIS — Z792 Long term (current) use of antibiotics: Secondary | ICD-10-CM | POA: Diagnosis not present

## 2022-05-08 DIAGNOSIS — I10 Essential (primary) hypertension: Secondary | ICD-10-CM | POA: Diagnosis not present

## 2022-05-08 DIAGNOSIS — G039 Meningitis, unspecified: Secondary | ICD-10-CM | POA: Diagnosis not present

## 2022-05-08 DIAGNOSIS — Z9181 History of falling: Secondary | ICD-10-CM | POA: Diagnosis not present

## 2022-05-10 ENCOUNTER — Ambulatory Visit: Payer: PPO | Admitting: Radiation Oncology

## 2022-05-10 DIAGNOSIS — D72829 Elevated white blood cell count, unspecified: Secondary | ICD-10-CM | POA: Diagnosis not present

## 2022-05-10 DIAGNOSIS — I10 Essential (primary) hypertension: Secondary | ICD-10-CM | POA: Diagnosis not present

## 2022-05-10 DIAGNOSIS — C719 Malignant neoplasm of brain, unspecified: Secondary | ICD-10-CM | POA: Diagnosis not present

## 2022-05-11 ENCOUNTER — Encounter: Payer: Self-pay | Admitting: Pharmacist

## 2022-05-11 ENCOUNTER — Other Ambulatory Visit (HOSPITAL_COMMUNITY): Payer: Self-pay

## 2022-05-11 ENCOUNTER — Encounter: Payer: Self-pay | Admitting: Radiation Therapy

## 2022-05-11 ENCOUNTER — Encounter: Payer: Self-pay | Admitting: Nurse Practitioner

## 2022-05-11 ENCOUNTER — Ambulatory Visit: Payer: PPO

## 2022-05-11 ENCOUNTER — Inpatient Hospital Stay: Payer: PPO | Attending: Internal Medicine | Admitting: Nurse Practitioner

## 2022-05-11 ENCOUNTER — Inpatient Hospital Stay (HOSPITAL_BASED_OUTPATIENT_CLINIC_OR_DEPARTMENT_OTHER): Payer: PPO | Admitting: Internal Medicine

## 2022-05-11 VITALS — BP 152/85 | HR 103 | Temp 98.5°F | Resp 16 | Ht 62.0 in | Wt 160.0 lb

## 2022-05-11 DIAGNOSIS — R530 Neoplastic (malignant) related fatigue: Secondary | ICD-10-CM | POA: Diagnosis not present

## 2022-05-11 DIAGNOSIS — E785 Hyperlipidemia, unspecified: Secondary | ICD-10-CM | POA: Insufficient documentation

## 2022-05-11 DIAGNOSIS — Z923 Personal history of irradiation: Secondary | ICD-10-CM | POA: Diagnosis not present

## 2022-05-11 DIAGNOSIS — C719 Malignant neoplasm of brain, unspecified: Secondary | ICD-10-CM

## 2022-05-11 DIAGNOSIS — Z515 Encounter for palliative care: Secondary | ICD-10-CM

## 2022-05-11 DIAGNOSIS — I10 Essential (primary) hypertension: Secondary | ICD-10-CM | POA: Diagnosis not present

## 2022-05-11 DIAGNOSIS — Z79899 Other long term (current) drug therapy: Secondary | ICD-10-CM | POA: Diagnosis not present

## 2022-05-11 DIAGNOSIS — R5383 Other fatigue: Secondary | ICD-10-CM | POA: Diagnosis not present

## 2022-05-11 DIAGNOSIS — Z79630 Long term (current) use of alkylating agent: Secondary | ICD-10-CM | POA: Diagnosis not present

## 2022-05-11 DIAGNOSIS — Z9221 Personal history of antineoplastic chemotherapy: Secondary | ICD-10-CM | POA: Diagnosis not present

## 2022-05-11 DIAGNOSIS — K219 Gastro-esophageal reflux disease without esophagitis: Secondary | ICD-10-CM | POA: Diagnosis not present

## 2022-05-11 MED ORDER — LEVETIRACETAM 500 MG PO TABS: 500.0000 mg | ORAL_TABLET | Freq: Two times a day (BID) | ORAL | 2 refills | Status: DC

## 2022-05-11 NOTE — Telephone Encounter (Signed)
Erroneous encounter

## 2022-05-11 NOTE — Progress Notes (Signed)
[place]  [phone]) [phone] [phone]) [phone]   Name: Lauren Hill Date: 05/11/2022 MRN: [id number]  DOB: 12/09/1934  Patient Care Team: Burnard Bunting, MD as PCP - General (Internal Medicine)    REASON FOR CONSULTATION: Lauren Hill is a 87 y.o. 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 Currently receiving IV antibiotics via PICC due to recent meningitis infection. Palliative ask to see for symptom management and goals of care.    SOCIAL HISTORY:     reports that she has never smoked. She has never used smokeless tobacco. She reports that she does not drink alcohol and does not use drugs.  ADVANCE DIRECTIVES:  Patient reports she has a documented advanced directive.  Her daughter [name] is her documented healthcare power of attorney.  CODE STATUS:   PAST MEDICAL HISTORY: Past Medical History:  Diagnosis Date   Arthritis    GERD (gastroesophageal reflux disease)    indigestion   History of blood transfusion    as an infant   History of hiatal hernia    History of pneumonia    Hyperlipidemia    Hypertension    Renal cyst     PAST SURGICAL HISTORY:  Past Surgical History:  Procedure Laterality Date   APPLICATION OF CRANIAL NAVIGATION Right 03/29/2022   Procedure: APPLICATION OF CRANIAL NAVIGATION;  Surgeon: Newman Pies, MD;  Location: [facility];  Service: Neurosurgery;  Laterality: Right;   COLONOSCOPY     CRANIOTOMY Right 03/29/2022   Procedure: CRANIOTOMY TUMOR EXCISION;  Surgeon: Newman Pies, MD;  Location: [facility];  Service: Neurosurgery;  Laterality: Right;   DILATION AND CURETTAGE OF UTERUS     x2   EYE SURGERY     cataracts   HIP SURGERY  5/05   right hip replacement   HIP SURGERY  5/11   left hip replacement   ORIF HUMERUS FRACTURE Left 11/11/2017   Procedure: OPEN REDUCTION INTERNAL FIXATION (ORIF) LEFT DISTAL  HUMERUS FRACTURE;  Surgeon: Shona Needles, MD;  Location: [facility];  Service: Orthopedics;  Laterality: Left;   RHINOPLASTY     TONSILLECTOMY     TOTAL SHOULDER ARTHROPLASTY Left 04/01/2016   Procedure: LEFT TOTAL SHOULDER ARTHROPLASTY;  Surgeon: Justice Britain, MD;  Location: [facility];  Service: Orthopedics;  Laterality: Left;    HEMATOLOGY/ONCOLOGY HISTORY:  Oncology History  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 ([place])  03/29/2022 Surgery   Craniotomy, resection of right temporal mass with Dr. Arnoldo Morale; path demonstrates high grade glioma, IDH pending   04/08/2022 Initial Diagnosis   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 ([place])   04/26/2022 -  Chemotherapy   Patient is on Treatment Plan : BRAIN GLIOBLASTOMA Radiation Therapy With Concurrent Temozolomide 75 mg/m2 Daily Followed By Sequential Maintenance Temozolomide x 6-12 cycles       ALLERGIES:  has No Known Allergies.  MEDICATIONS:  Current Outpatient Medications  Medication Sig Dispense Refill   acetaminophen (TYLENOL) 650 MG CR tablet Take 1,300 mg by mouth in the morning, at noon, and at bedtime.     amLODipine (NORVASC) 5 MG tablet Take 5 mg by mouth 2 (two) times daily.      atenolol (TENORMIN) 50 MG tablet Take 50 mg by mouth at bedtime.     atorvastatin (LIPITOR) 10 MG tablet Take 10 mg by mouth daily at 6 PM.      cholecalciferol (VITAMIN D3) 25  MCG (1000 UNIT) tablet Take 1,000 Units by mouth daily.     famotidine (PEPCID) 10 MG tablet Take 10 mg by mouth at bedtime as needed for heartburn or indigestion.     levETIRAcetam (KEPPRA) 500 MG tablet Take 1 tablet (500 mg total) by mouth 2 (two) times daily. 60 tablet 2   linezolid (ZYVOX) 600 MG tablet Take 1 tablet by mouth 2 (two) times daily. For total of 16 doses     magnesium gluconate (MAGONATE) 500 MG tablet Take 500 mg by mouth at bedtime. (Patient not taking: Reported on 05/11/2022)     multivitamin-lutein (OCUVITE-LUTEIN) CAPS capsule Take 1  capsule by mouth in the morning and at bedtime.     ondansetron (ZOFRAN) 8 MG tablet Take 1 tablet (8 mg total) by mouth every 8 (eight) hours as needed for nausea or vomiting. May take 30-60 minutes prior to Temodar administration if nausea/vomiting occurs as needed. 30 tablet 1   Potassium 99 MG TABS Take 99 mg by mouth at bedtime.     temozolomide (TEMODAR) 140 MG capsule Take 1 capsule (140 mg total) by mouth daily. May take on an empty stomach to decrease nausea & vomiting. 42 capsule 0   No current facility-administered medications for this visit.    VITAL SIGNS: LMP 06/08/1983  There were no vitals filed for this visit.  Estimated body mass index is 29.26 kg/m as calculated from the following:   Height as of an earlier encounter on 05/11/22: _0  (1.575 m).   Weight as of an earlier encounter on 05/11/22: 160 lb (72.6 kg).  LABS: CBC:    Component Value Date/Time   WBC 13.8 (H) 03/24/2022 0517   HGB 11.2 (L) 03/29/2022 1655   HCT 33.0 (L) 03/29/2022 1655   PLT 267 03/24/2022 0517   MCV 98.5 03/24/2022 0517   NEUTROABS 9.7 (H) 03/24/2022 0517   LYMPHSABS 2.7 03/24/2022 0517   MONOABS 1.3 (H) 03/24/2022 0517   EOSABS 0.1 03/24/2022 0517   BASOSABS 0.1 03/24/2022 0517   Comprehensive Metabolic Panel:    Component Value Date/Time   NA 137 03/29/2022 1655   K 3.1 (L) 03/29/2022 1655   CL 107 03/24/2022 0517   CO2 23 03/24/2022 0517   BUN 23 03/24/2022 0517   CREATININE 0.90 03/24/2022 0517   GLUCOSE 101 (H) 03/24/2022 0517   CALCIUM 9.6 03/24/2022 0517   CALCIUM 8.8 11/11/2017 0637   AST 19 11/11/2017 0637   ALT 18 11/11/2017 0637   ALKPHOS 73 11/11/2017 0637   BILITOT 0.8 11/11/2017 0637   PROT 6.3 (L) 11/11/2017 0637   ALBUMIN 3.3 (L) 11/11/2017 1856    RADIOGRAPHIC STUDIES: No results found.  PERFORMANCE STATUS (ECOG) : 1 - Symptomatic but completely ambulatory  Review of Systems  Constitutional:  Positive for fatigue.  Unless otherwise noted, a complete  review of systems is negative.  Physical Exam General: NAD Cardiovascular: regular rate and rhythm Pulmonary: normal breathing pattern  Abdomen: soft, nontender, + bowel sounds Extremities: no edema, no joint deformities Skin: no rashes Neurological:AAO x3, mood appropriate   IMPRESSION: This is my initial visit with Ms. Clinkscales. She is present with her daughter [name] and sister, [name]. No acute distress. Ambulatory. Able to engage appropriately in discussions.   I introduced myself, Maygan RN, and Palliative's role in collaboration with the oncology team. Concept of Palliative Care was introduced as specialized medical care for people and their families living with serious illness.  It focuses on providing relief  from the symptoms and stress of a serious illness.  The goal is to improve quality of life for both the patient and the family. Values and goals of care important to patient and family were attempted to be elicited.   Ms. Westerhoff lives in the home alone. Widowed. Former second Recruitment consultant and homemaker. Her Sister [name] lives close by.  Her daughter lives in [place] but is involved in her care.  She is able to perform most ADLs independently.  Ambulatory with walker.  Appetite is good.  Endorses ongoing fatigue.  We discussed her current illness and what it means in the larger context of Her on-going co-morbidities. Natural disease trajectory and expectations were discussed.   Patient and family are realistic in their understanding.  She is currently finishing up her course of antibiotic treatments for recent meningitis infection. Family is aware of all treatment options and plans to discuss making decisions in the near future on how they wish to proceed.   I discussed the importance of continued conversation with family and their medical providers regarding overall plan of care and treatment options, ensuring decisions are within the context of the patients values and  GOCs.  PLAN: Established therapeutic relationship. Education provided on palliative's role in collaboration with their Oncology/Radiation team. No symptom management needs at this time.  Patient has a documented advanced directive. Her daughter [name] is her Scientist, research (medical).  Ongoing goals of care support. Patient and family clear in their understanding of her condition. Remaining hopeful for stability. Quality of life is important to her. They plan to discuss options as a family while completing current antibiotic therapies. Will then make final decisions regarding how to proceed. I will plan to see patient back in 3-4 weeks in collaboration to other oncology appointments.    Patient expressed understanding and was in agreement with this plan. She also understands that She can call the clinic at any time with any questions, concerns, or complaints.   Thank you for your referral and allowing Palliative to assist in Ms. JELISSA ESPIRITU care.   Number and complexity of problems addressed: HIGH - 1 or more chronic illnesses with SEVERE exacerbation, progression, or side effects of treatment - advanced cancer, pain. Any controlled substances utilized were prescribed in the context of palliative care.  Time Total: 45 min   Visit consisted of counseling and education dealing with the complex and emotionally intense issues of symptom management and palliative care in the setting of serious and potentially life-threatening illness.Greater than 50%  of this time was spent counseling and coordinating care related to the above assessment and plan.  Signed by: Alda Lea, AGPCNP-BC Palliative Medicine Team/[place]

## 2022-05-11 NOTE — Progress Notes (Signed)
[facility] [facility] [place] [facility] [place], [place] [address] [phone]   Interval Evaluation  Date of Service: 05/11/22 Patient Name: Lauren Hill Patient MRN: [id number] Patient DOB: [birth year] Provider: Ventura Sellers, MD  Identifying Statement:  JOHANNE Hill is a 87 y.o. female with right temporal  high grade glioma    Oncologic History: Oncology History  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 [facility])  03/29/2022 Surgery   Craniotomy, resection of right temporal mass with Dr. Arnoldo Morale; path demonstrates high grade glioma, IDH pending   04/08/2022 Initial Diagnosis   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 ([place])   04/26/2022 -  Chemotherapy   Patient is on Treatment Plan : BRAIN GLIOBLASTOMA Radiation Therapy With Concurrent Temozolomide 75 mg/m2 Daily Followed By Sequential Maintenance Temozolomide x 6-12 cycles       Biomarkers:  MGMT Unknown.  IDH 1/2 Wild type.  EGFR Unknown  TERT Unknown   Interval History: RICKEY FARRIER presents today after follow up after hospitalization for meningitis.  She has continued to recover cognition, motor function.  No longer with neck rigidity.  At present, she is mostly independent with ADLs and is doing some independent walking.  Fatigue is significant, sleeping up to 16 hours per day.  Has 3 days of antibiotics remaining via inserted PICC.  Home nursing and home PT are on board currently.  H+P (04/22/22) Patient presented last month with episode of LOC while at church.  She was "out for short time" then returned to normal shortly thereafter.  CNS imaging demonstrates large enhancing right temporal mass.  She underwent craniotomy, resection with Dr. Arnoldo Morale on 03/29/22; prelim path was high grade glioma pending final.  Since surgery, she complains of some issues with hearing, otherwise functionally intact.  Walking on her own with the walker, energy level is good.   Continues on Keppra 540m twice per day, Decadron 211mtwice per day.  Medications: Current Outpatient Medications on File Prior to Visit  Medication Sig Dispense Refill   acetaminophen (TYLENOL) 650 MG CR tablet Take 1,300 mg by mouth in the morning, at noon, and at bedtime.     amLODipine (NORVASC) 5 MG tablet Take 5 mg by mouth 2 (two) times daily.      atenolol (TENORMIN) 50 MG tablet Take 50 mg by mouth at bedtime.     atorvastatin (LIPITOR) 10 MG tablet Take 10 mg by mouth daily at 6 PM.      cholecalciferol (VITAMIN D3) 25 MCG (1000 UNIT) tablet Take 1,000 Units by mouth daily.     famotidine (PEPCID) 10 MG tablet Take 10 mg by mouth at bedtime as needed for heartburn or indigestion.     levETIRAcetam (KEPPRA) 500 MG tablet Take 1 tablet (500 mg total) by mouth 2 (two) times daily. 60 tablet 0   linezolid (ZYVOX) 600 MG tablet Take 1 tablet by mouth 2 (two) times daily. For total of 16 doses     multivitamin-lutein (OCUVITE-LUTEIN) CAPS capsule Take 1 capsule by mouth in the morning and at bedtime.     ondansetron (ZOFRAN) 8 MG tablet Take 1 tablet (8 mg total) by mouth every 8 (eight) hours as needed for nausea or vomiting. May take 30-60 minutes prior to Temodar administration if nausea/vomiting occurs as needed. 30 tablet 1   Potassium 99 MG TABS Take 99 mg by mouth at bedtime.     temozolomide (TEMODAR) 140 MG capsule Take 1  capsule (140 mg total) by mouth daily. May take on an empty stomach to decrease nausea & vomiting. 42 capsule 0   magnesium gluconate (MAGONATE) 500 MG tablet Take 500 mg by mouth at bedtime. (Patient not taking: Reported on 05/11/2022)     No current facility-administered medications on file prior to visit.    Allergies: No Known Allergies Past Medical History:  Past Medical History:  Diagnosis Date   Arthritis    GERD (gastroesophageal reflux disease)    indigestion   History of blood transfusion    as an infant   History of hiatal hernia    History of  pneumonia    Hyperlipidemia    Hypertension    Renal cyst    Past Surgical History:  Past Surgical History:  Procedure Laterality Date   APPLICATION OF CRANIAL NAVIGATION Right 03/29/2022   Procedure: APPLICATION OF CRANIAL NAVIGATION;  Surgeon: Newman Pies, MD;  Location: [facility];  Service: Neurosurgery;  Laterality: Right;   COLONOSCOPY     CRANIOTOMY Right 03/29/2022   Procedure: CRANIOTOMY TUMOR EXCISION;  Surgeon: Newman Pies, MD;  Location: [facility];  Service: Neurosurgery;  Laterality: Right;   DILATION AND CURETTAGE OF UTERUS     x2   EYE SURGERY     cataracts   HIP SURGERY  5/05   right hip replacement   HIP SURGERY  5/11   left hip replacement   ORIF HUMERUS FRACTURE Left 11/11/2017   Procedure: OPEN REDUCTION INTERNAL FIXATION (ORIF) LEFT DISTAL HUMERUS FRACTURE;  Surgeon: Shona Needles, MD;  Location: [facility];  Service: Orthopedics;  Laterality: Left;   RHINOPLASTY     TONSILLECTOMY     TOTAL SHOULDER ARTHROPLASTY Left 04/01/2016   Procedure: LEFT TOTAL SHOULDER ARTHROPLASTY;  Surgeon: Justice Britain, MD;  Location: [facility];  Service: Orthopedics;  Laterality: Left;   Social History:  Social History   Socioeconomic History   Marital status: Widowed    Spouse name: Not on file   Number of children: Not on file   Years of education: Not on file   Highest education level: Not on file  Occupational History   Not on file  Tobacco Use   Smoking status: Never   Smokeless tobacco: Never  Vaping Use   Vaping Use: Never used  Substance and Sexual Activity   Alcohol use: No   Drug use: No   Sexual activity: Not Currently  Other Topics Concern   Not on file  Social History Narrative   Not on file   Social Determinants of Health   Financial Resource Strain: Low Risk  (04/12/2022)   Overall Financial Resource Strain (CARDIA)    Difficulty of Paying Living Expenses: Not hard at all  Food Insecurity: No Food Insecurity (03/23/2022)   Hunger Vital Sign    Worried  About Running Out of Food in the Last Year: Never true    Ran Out of Food in the Last Year: Never true  Transportation Needs: No Transportation Needs (03/24/2022)   PRAPARE - Hydrologist (Medical): No    Lack of Transportation (Non-Medical): No  Physical Activity: Not on file  Stress: Not on file  Social Connections: Not on file  Intimate Partner Violence: Not At Risk (03/24/2022)   Humiliation, Afraid, Rape, and Kick questionnaire    Fear of Current or Ex-Partner: No    Emotionally Abused: No    Physically Abused: No    Sexually Abused: No   Family History:  Family History  Problem Relation Age of Onset   Heart disease Mother     Review of Systems: Constitutional: Doesn't report fevers, chills or abnormal weight loss Eyes: Doesn't report blurriness of vision Ears, nose, mouth, throat, and face: Doesn't report sore throat Respiratory: Doesn't report cough, dyspnea or wheezes Cardiovascular: Doesn't report palpitation, chest discomfort  Gastrointestinal:  Doesn't report nausea, constipation, diarrhea GU: Doesn't report incontinence Skin: Doesn't report skin rashes Neurological: Per HPI Musculoskeletal: Doesn't report joint pain Behavioral/Psych: Doesn't report anxiety  Physical Exam: Vitals:   05/11/22 1118  BP: (!) 152/85  Pulse: (!) 103  Resp: 16  Temp: 98.5 F (36.9 C)  SpO2: 95%   KPS: 70. ECOG 1  General: Alert, cooperative, pleasant, in no acute distress Head: Normal EENT: No conjunctival injection or scleral icterus.  Lungs: Resp effort normal Cardiac: Regular rate Abdomen: Non-distended abdomen Skin: No rashes cyanosis or petechiae. Extremities: No clubbing or edema  Neurologic Exam: Mental Status: Awake, alert, attentive to examiner. Oriented to self and environment. Language is fluent with intact comprehension.  Cranial Nerves: Visual acuity is grossly normal. Visual fields are full. Extra-ocular movements intact. No  ptosis. Face is symmetric Motor: Tone and bulk are normal. Power is full in both arms and legs. Reflexes are symmetric, no pathologic reflexes present.  Sensory: Intact to light touch Gait: Orthopedic and neurologic dystaxia   Labs: I have reviewed the data as listed    Component Value Date/Time   NA 137 03/29/2022 1655   K 3.1 (L) 03/29/2022 1655   CL 107 03/24/2022 0517   CO2 23 03/24/2022 0517   GLUCOSE 101 (H) 03/24/2022 0517   BUN 23 03/24/2022 0517   CREATININE 0.90 03/24/2022 0517   CALCIUM 9.6 03/24/2022 0517   CALCIUM 8.8 11/11/2017 0637   PROT 6.3 (L) 11/11/2017 0637   ALBUMIN 3.3 (L) 11/11/2017 0637   AST 19 11/11/2017 0637   ALT 18 11/11/2017 0637   ALKPHOS 73 11/11/2017 0637   BILITOT 0.8 11/11/2017 0637   GFRNONAA >60 03/24/2022 0517   GFRAA >60 11/11/2017 0637   Lab Results  Component Value Date   WBC 13.8 (H) 03/24/2022   NEUTROABS 9.7 (H) 03/24/2022   HGB 11.2 (L) 03/29/2022   HCT 33.0 (L) 03/29/2022   MCV 98.5 03/24/2022   PLT 267 03/24/2022      Assessment/Plan 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 ([place])  [name] is clinically improving, following admission at [facility] for bacterial meningitis.  The infection did not speciate a specific pathogen, but diagnosis was straightforward from pleocytosis, fever, nuchal rigidity.    She will complete antibiotic therapy with linezolid on 05/13/22 per ID recommendations.    We had an additional goals of care conversation with her and her family regarding pathology, prognosis, and available treatment pathways for high grade glioma.  Her perspective has changed in the setting of this most recent setback.  She is hesitant regarding full 6 weeks of radiochemotherapy.    We discussed possibility of 3 weeks of RT without chemo, vs direct hospice referral.  Palliative care will meet with them today.  Will con't Keppra 558m BID given possible presentation with seizure.  Will initiate home  health given lapse of planned support from home nursing after completing antibiotics.  Patient and family will get back to [facility] this week with final treatment plan decision.  All questions were answered. The patient knows to call the clinic with any problems, questions or concerns. No  barriers to learning were detected.  The total time spent in the encounter was 40 minutes and more than 50% was on counseling and review of test results   Ventura Sellers, MD Medical Director of Neuro-Oncology [facility] [place] 05/11/22 11:34 AM

## 2022-05-11 NOTE — Progress Notes (Signed)
Dr. Mickeal Skinner met with the patient and family today. She is recovering well from the meningitis, still has a few days of antibiotics via PICC. ID is recommending a full week between abx and initiating RT/chemo. In [place] discussion, she is unsure of path forward. They discussed abbreviated (3 week) course of RT without TMZ, which she seemed interested in. Palliative care also meeting with them. They will get back to Dr. Mickeal Skinner with decision in the next few days.   I have informed Linac #1 of the change in her start date and the possibility she will need to be re-planned for a 3 week course instead. They are cancelling her treatments through 12/15, we will wait to hear back from the patient to determine when her course of antibiotics have been completed and how she would like to move forward with radiation treatment.   Mont Dutton R.T.(R)(T) Radiation Special Procedure Navigator

## 2022-05-12 ENCOUNTER — Other Ambulatory Visit: Payer: Self-pay

## 2022-05-12 ENCOUNTER — Ambulatory Visit: Payer: PPO

## 2022-05-13 ENCOUNTER — Inpatient Hospital Stay: Payer: PPO

## 2022-05-13 ENCOUNTER — Inpatient Hospital Stay: Payer: PPO | Admitting: Internal Medicine

## 2022-05-13 ENCOUNTER — Telehealth: Payer: Self-pay | Admitting: *Deleted

## 2022-05-13 ENCOUNTER — Ambulatory Visit: Payer: PPO

## 2022-05-13 NOTE — Telephone Encounter (Signed)
Spoke with Levada Dy with [name] [phone].  She needed to confirm is nursing needed to be extended for this patient.  Advised yes.  Plan for 1 visit per week.   She also questioned if PICC line should still be pulled on 05/14/2022.  Per Dr Mickeal Skinner he does not have any reason to leave PICC.    Bay[facility] completed labs on 05/12/2022 and will fax those to our office now.

## 2022-05-13 NOTE — Telephone Encounter (Signed)
Occupational Therapist with [name] called regarding blood pressure reading and parameters they have set that may require adjustment.  Blood pressure at beginning was 160/90 and his parameters are call about SBP>150.  Returned call to therapist and he advised that he rechecked and her pressure was 144/80.  Explained that we are giving any treatment that should impact her blood pressure and because she is on blood pressure medication by her PCP we would encourage him to call that provider Dr Reynaldo Minium.  Provided phone for that provider to therapist.

## 2022-05-14 ENCOUNTER — Ambulatory Visit: Payer: PPO

## 2022-05-14 ENCOUNTER — Telehealth: Payer: Self-pay | Admitting: Radiation Therapy

## 2022-05-14 ENCOUNTER — Encounter: Payer: Self-pay | Admitting: Internal Medicine

## 2022-05-14 ENCOUNTER — Telehealth: Payer: Self-pay | Admitting: *Deleted

## 2022-05-14 NOTE — Telephone Encounter (Signed)
Received vm message from daughter requesting a call back.  Spoke with daughter, Lauren Hill. She had a few issues she wanted to address.  #1   Pt has finished her antibiotics. These were given through a PICC line. She wants to confirm with Dr. Mickeal Skinner that the PICC line can be pulled. She states Dr. Mickeal Skinner told her on 05/11/22 to keep it in. Dr. Renda Rolls nurse, Shelle Iron, has entered a note yesterday saying that Dr. Mickeal Skinner is ok with the PICC line being pulled.  Please clarify.  And if PICC line stays in, can pt have labs taken from the PICC line. Lauren Hill states she will call the Infectious disease provider about the PICC line as well.  Advised that under normal circumstances, we pull the PICC line once the antibiotics are done as the PICC line is a source of infection and we don't leave them in longer than necessary.  #2   Pt and daughter want to pursue the radiation but do not want to add the chemotherapy portion to this.  Pt has her initail radiation oncology appt  scheduled for 05/17/22 as well as subsequent treatment dates and times.  #3   [name] confirmed that pt will keep her appt with Dr. Mickeal Skinner on 05/27/22

## 2022-05-14 NOTE — Telephone Encounter (Signed)
I called the patient's daughter today in response to an epic message about the number of treatments scheduled. Ms. Massing has completed her antibiotics and wants to proceed with 3-week radiation course, starting on 12/18. They have asked if the existing appointment on 12/18 @ 2:00 still works?  This message was sent Linac #1 and the rest of her treatment team for clarification and initiation of re-plan.   Mont Dutton R.T.(R)(T) Radiation Special Procedures Navigator

## 2022-05-17 ENCOUNTER — Encounter: Payer: Self-pay | Admitting: Internal Medicine

## 2022-05-17 ENCOUNTER — Ambulatory Visit: Payer: PPO

## 2022-05-17 ENCOUNTER — Telehealth: Payer: Self-pay | Admitting: *Deleted

## 2022-05-17 NOTE — Telephone Encounter (Signed)
Per Dr Mickeal Skinner agreed that ok for PICC line to be pulled.  Called Lauren Hill with Lauren Hill [phone] and gave ok to remove PICC line.  Daughter is also aware.

## 2022-05-18 ENCOUNTER — Ambulatory Visit: Payer: PPO

## 2022-05-18 ENCOUNTER — Telehealth: Payer: Self-pay | Admitting: Radiation Therapy

## 2022-05-18 NOTE — Telephone Encounter (Signed)
Verified with the patient's daughter that Dr. Lisbeth Renshaw is adjusting Lauren Hill's treatment course to 3 weeks and informed them of the start date of 12/18 and treatment time.   Mont Dutton R.T.(R)(T) Radiation Special Procedures Navigator

## 2022-05-19 ENCOUNTER — Ambulatory Visit: Payer: PPO

## 2022-05-19 DIAGNOSIS — C712 Malignant neoplasm of temporal lobe: Secondary | ICD-10-CM | POA: Diagnosis not present

## 2022-05-20 ENCOUNTER — Ambulatory Visit: Payer: PPO

## 2022-05-20 ENCOUNTER — Telehealth: Payer: Self-pay | Admitting: *Deleted

## 2022-05-20 DIAGNOSIS — C712 Malignant neoplasm of temporal lobe: Secondary | ICD-10-CM | POA: Diagnosis not present

## 2022-05-20 NOTE — Telephone Encounter (Signed)
Thank you for your recent order for [facility] for Lauren Hill 1935-05-28. We have been unable to obtain the sample requested after multiple attempts made since 04/12/2022 and cannot move forward with testing at this time. This case will be deactivated, and we will not make any further attempts to contact the pathology lab. However, if we do receive the sample in the future, we will be happy to reactivate this case with [facility] priority.      Please let us know if you have any questions.     Thank you     [place] [address]  Phone: [phone] Fax: [phone]  Email: [email or web address]  Web: [email or web address]  Twitter: [email or web address]   Facebook: [email or web address]

## 2022-05-21 ENCOUNTER — Other Ambulatory Visit (HOSPITAL_COMMUNITY): Payer: Self-pay

## 2022-05-21 ENCOUNTER — Ambulatory Visit: Payer: PPO

## 2022-05-24 ENCOUNTER — Other Ambulatory Visit: Payer: Self-pay

## 2022-05-24 ENCOUNTER — Ambulatory Visit
Admission: RE | Admit: 2022-05-24 | Discharge: 2022-05-24 | Disposition: A | Discharge status: Home / Self Care | Payer: PPO | Source: Ambulatory Visit | Attending: Radiation Oncology | Admitting: Radiation Oncology

## 2022-05-24 DIAGNOSIS — Z510 Encounter for antineoplastic radiation therapy: Secondary | ICD-10-CM | POA: Diagnosis not present

## 2022-05-24 DIAGNOSIS — C712 Malignant neoplasm of temporal lobe: Secondary | ICD-10-CM | POA: Diagnosis not present

## 2022-05-24 LAB — RAD ONC ARIA SESSION SUMMARY
Course Elapsed Days: 0
Plan Fractions Treated to Date: 1
Plan Prescribed Dose Per Fraction: 2.67 Gy
Plan Total Fractions Prescribed: 15
Plan Total Prescribed Dose: 40.05 Gy
Reference Point Dosage Given to Date: 2.67 Gy
Reference Point Session Dosage Given: 2.67 Gy
Session Number: 1

## 2022-05-25 ENCOUNTER — Other Ambulatory Visit: Payer: Self-pay

## 2022-05-25 ENCOUNTER — Ambulatory Visit
Admission: RE | Admit: 2022-05-25 | Discharge: 2022-05-25 | Disposition: A | Discharge status: Home / Self Care | Payer: PPO | Source: Ambulatory Visit | Attending: Radiation Oncology | Admitting: Radiation Oncology

## 2022-05-25 DIAGNOSIS — C712 Malignant neoplasm of temporal lobe: Secondary | ICD-10-CM | POA: Diagnosis not present

## 2022-05-25 DIAGNOSIS — Z510 Encounter for antineoplastic radiation therapy: Secondary | ICD-10-CM | POA: Diagnosis not present

## 2022-05-25 LAB — RAD ONC ARIA SESSION SUMMARY
Course Elapsed Days: 1
Plan Fractions Treated to Date: 2
Plan Prescribed Dose Per Fraction: 2.67 Gy
Plan Total Fractions Prescribed: 15
Plan Total Prescribed Dose: 40.05 Gy
Reference Point Dosage Given to Date: 5.34 Gy
Reference Point Session Dosage Given: 2.67 Gy
Session Number: 2

## 2022-05-26 ENCOUNTER — Other Ambulatory Visit: Payer: Self-pay

## 2022-05-26 ENCOUNTER — Ambulatory Visit
Admission: RE | Admit: 2022-05-26 | Discharge: 2022-05-26 | Disposition: A | Discharge status: Home / Self Care | Payer: PPO | Source: Ambulatory Visit | Attending: Radiation Oncology | Admitting: Radiation Oncology

## 2022-05-26 DIAGNOSIS — C712 Malignant neoplasm of temporal lobe: Secondary | ICD-10-CM | POA: Diagnosis not present

## 2022-05-26 DIAGNOSIS — Z510 Encounter for antineoplastic radiation therapy: Secondary | ICD-10-CM | POA: Diagnosis not present

## 2022-05-26 LAB — RAD ONC ARIA SESSION SUMMARY
Course Elapsed Days: 2
Plan Fractions Treated to Date: 3
Plan Prescribed Dose Per Fraction: 2.67 Gy
Plan Total Fractions Prescribed: 15
Plan Total Prescribed Dose: 40.05 Gy
Reference Point Dosage Given to Date: 8.01 Gy
Reference Point Session Dosage Given: 2.67 Gy
Session Number: 3

## 2022-05-27 ENCOUNTER — Other Ambulatory Visit: Payer: PPO

## 2022-05-27 ENCOUNTER — Other Ambulatory Visit: Payer: Self-pay

## 2022-05-27 ENCOUNTER — Ambulatory Visit: Payer: PPO | Admitting: Internal Medicine

## 2022-05-27 ENCOUNTER — Ambulatory Visit
Admission: RE | Admit: 2022-05-27 | Discharge: 2022-05-27 | Disposition: A | Discharge status: Home / Self Care | Payer: PPO | Source: Ambulatory Visit | Attending: Radiation Oncology | Admitting: Radiation Oncology

## 2022-05-27 DIAGNOSIS — C712 Malignant neoplasm of temporal lobe: Secondary | ICD-10-CM | POA: Diagnosis not present

## 2022-05-27 DIAGNOSIS — Z510 Encounter for antineoplastic radiation therapy: Secondary | ICD-10-CM | POA: Diagnosis not present

## 2022-05-27 LAB — RAD ONC ARIA SESSION SUMMARY
Course Elapsed Days: 3
Plan Fractions Treated to Date: 4
Plan Prescribed Dose Per Fraction: 2.67 Gy
Plan Total Fractions Prescribed: 15
Plan Total Prescribed Dose: 40.05 Gy
Reference Point Dosage Given to Date: 10.68 Gy
Reference Point Session Dosage Given: 2.67 Gy
Session Number: 4

## 2022-05-28 ENCOUNTER — Other Ambulatory Visit: Payer: Self-pay

## 2022-05-28 ENCOUNTER — Ambulatory Visit
Admission: RE | Admit: 2022-05-28 | Discharge: 2022-05-28 | Disposition: A | Discharge status: Home / Self Care | Payer: PPO | Source: Ambulatory Visit | Attending: Radiation Oncology | Admitting: Radiation Oncology

## 2022-05-28 DIAGNOSIS — Z510 Encounter for antineoplastic radiation therapy: Secondary | ICD-10-CM | POA: Diagnosis not present

## 2022-05-28 DIAGNOSIS — C712 Malignant neoplasm of temporal lobe: Secondary | ICD-10-CM | POA: Diagnosis not present

## 2022-05-28 LAB — RAD ONC ARIA SESSION SUMMARY
Course Elapsed Days: 4
Plan Fractions Treated to Date: 5
Plan Prescribed Dose Per Fraction: 2.67 Gy
Plan Total Fractions Prescribed: 15
Plan Total Prescribed Dose: 40.05 Gy
Reference Point Dosage Given to Date: 13.35 Gy
Reference Point Session Dosage Given: 2.67 Gy
Session Number: 5

## 2022-06-01 ENCOUNTER — Other Ambulatory Visit: Payer: Self-pay

## 2022-06-01 ENCOUNTER — Ambulatory Visit
Admission: RE | Admit: 2022-06-01 | Discharge: 2022-06-01 | Disposition: A | Discharge status: Home / Self Care | Payer: PPO | Source: Ambulatory Visit | Attending: Radiation Oncology | Admitting: Radiation Oncology

## 2022-06-01 DIAGNOSIS — Z510 Encounter for antineoplastic radiation therapy: Secondary | ICD-10-CM | POA: Diagnosis not present

## 2022-06-01 DIAGNOSIS — C712 Malignant neoplasm of temporal lobe: Secondary | ICD-10-CM | POA: Diagnosis not present

## 2022-06-01 LAB — RAD ONC ARIA SESSION SUMMARY
Course Elapsed Days: 8
Plan Fractions Treated to Date: 6
Plan Prescribed Dose Per Fraction: 2.67 Gy
Plan Total Fractions Prescribed: 15
Plan Total Prescribed Dose: 40.05 Gy
Reference Point Dosage Given to Date: 16.02 Gy
Reference Point Session Dosage Given: 2.67 Gy
Session Number: 6

## 2022-06-02 ENCOUNTER — Ambulatory Visit
Admission: RE | Admit: 2022-06-02 | Discharge: 2022-06-02 | Disposition: A | Discharge status: Home / Self Care | Payer: PPO | Source: Ambulatory Visit | Attending: Radiation Oncology | Admitting: Radiation Oncology

## 2022-06-02 ENCOUNTER — Other Ambulatory Visit: Payer: Self-pay

## 2022-06-02 DIAGNOSIS — C712 Malignant neoplasm of temporal lobe: Secondary | ICD-10-CM | POA: Diagnosis not present

## 2022-06-02 DIAGNOSIS — Z510 Encounter for antineoplastic radiation therapy: Secondary | ICD-10-CM | POA: Diagnosis not present

## 2022-06-02 LAB — RAD ONC ARIA SESSION SUMMARY
Course Elapsed Days: 9
Plan Fractions Treated to Date: 7
Plan Prescribed Dose Per Fraction: 2.67 Gy
Plan Total Fractions Prescribed: 15
Plan Total Prescribed Dose: 40.05 Gy
Reference Point Dosage Given to Date: 18.69 Gy
Reference Point Session Dosage Given: 2.67 Gy
Session Number: 7

## 2022-06-03 ENCOUNTER — Inpatient Hospital Stay (HOSPITAL_BASED_OUTPATIENT_CLINIC_OR_DEPARTMENT_OTHER): Payer: PPO | Admitting: Internal Medicine

## 2022-06-03 ENCOUNTER — Ambulatory Visit
Admission: RE | Admit: 2022-06-03 | Discharge: 2022-06-03 | Disposition: A | Discharge status: Home / Self Care | Payer: PPO | Source: Ambulatory Visit | Attending: Radiation Oncology | Admitting: Radiation Oncology

## 2022-06-03 ENCOUNTER — Other Ambulatory Visit: Payer: Self-pay

## 2022-06-03 VITALS — BP 150/69 | HR 105 | Temp 97.7°F | Resp 20 | Wt 164.1 lb

## 2022-06-03 DIAGNOSIS — C712 Malignant neoplasm of temporal lobe: Secondary | ICD-10-CM | POA: Diagnosis not present

## 2022-06-03 DIAGNOSIS — C719 Malignant neoplasm of brain, unspecified: Secondary | ICD-10-CM

## 2022-06-03 DIAGNOSIS — Z510 Encounter for antineoplastic radiation therapy: Secondary | ICD-10-CM | POA: Diagnosis not present

## 2022-06-03 LAB — RAD ONC ARIA SESSION SUMMARY
Course Elapsed Days: 10
Plan Fractions Treated to Date: 8
Plan Prescribed Dose Per Fraction: 2.67 Gy
Plan Total Fractions Prescribed: 15
Plan Total Prescribed Dose: 40.05 Gy
Reference Point Dosage Given to Date: 21.36 Gy
Reference Point Session Dosage Given: 2.67 Gy
Session Number: 8

## 2022-06-03 NOTE — Progress Notes (Signed)
[facility] [place], [place] [address] [phone]   Interval Evaluation  Date of Service: 06/03/22 Patient Name: Lauren Hill Patient MRN: [id number] Patient DOB: [birth year] Provider: Ventura Sellers, MD  Identifying Statement:  Lauren Hill is a 87 y.o. female with right temporal  high grade glioma    Oncologic History: Oncology History  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 [facility])  03/29/2022 Surgery   Craniotomy, resection of right temporal mass with Dr. Arnoldo Morale; path demonstrates high grade glioma, IDH pending   04/08/2022 Initial Diagnosis   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 ([place])   05/24/2022 -  Radiation Therapy   3 weeks IMRT with Dr. Lisbeth Renshaw without TMZ     Biomarkers:  MGMT Unknown.  IDH 1/2 Wild type.  EGFR Unknown  TERT Unknown   Interval History: Lauren Hill presents today, now halfway through 3 weeks course of radiation.  No significant new or progressive changes today.  At present, she is mostly independent with ADLs and is doing some independent walking.  Fatigue and sleep volume have improved.  Home nursing and home PT are on board currently.  H+P (04/22/22) Patient presented last month with episode of LOC while at church.  She was "out for short time" then returned to normal shortly thereafter.  CNS imaging demonstrates large enhancing right temporal mass.  She underwent craniotomy, resection with Dr. Arnoldo Morale on 03/29/22; prelim path was high grade glioma pending final.  Since surgery, she complains of some issues with hearing, otherwise functionally intact.  Walking on her own with the walker, energy level is good.  Continues on Keppra 563m twice per day, Decadron 224mtwice per day.  Medications: Current Outpatient Medications on File Prior to Visit  Medication Sig Dispense Refill   acetaminophen (TYLENOL) 650 MG CR tablet Take 1,300 mg by mouth in the  morning, at noon, and at bedtime.     amLODipine (NORVASC) 5 MG tablet Take 5 mg by mouth 2 (two) times daily.      atenolol (TENORMIN) 50 MG tablet Take 50 mg by mouth at bedtime.     atorvastatin (LIPITOR) 10 MG tablet Take 10 mg by mouth daily at 6 PM.      cholecalciferol (VITAMIN D3) 25 MCG (1000 UNIT) tablet Take 1,000 Units by mouth daily.     famotidine (PEPCID) 10 MG tablet Take 10 mg by mouth at bedtime as needed for heartburn or indigestion.     levETIRAcetam (KEPPRA) 500 MG tablet Take 1 tablet (500 mg total) by mouth 2 (two) times daily. 60 tablet 2   magnesium gluconate (MAGONATE) 500 MG tablet Take 500 mg by mouth at bedtime. (Patient not taking: Reported on 05/11/2022)     multivitamin-lutein (OCUVITE-LUTEIN) CAPS capsule Take 1 capsule by mouth in the morning and at bedtime.     ondansetron (ZOFRAN) 8 MG tablet Take 1 tablet (8 mg total) by mouth every 8 (eight) hours as needed for nausea or vomiting. May take 30-60 minutes prior to Temodar administration if nausea/vomiting occurs as needed. 30 tablet 1   Potassium 99 MG TABS Take 99 mg by mouth at bedtime.     No current facility-administered medications on file prior to visit.    Allergies: No Known Allergies Past Medical History:  Past Medical History:  Diagnosis Date   Arthritis    GERD (gastroesophageal reflux disease)    indigestion   History of  blood transfusion    as an infant   History of hiatal hernia    History of pneumonia    Hyperlipidemia    Hypertension    Renal cyst    Past Surgical History:  Past Surgical History:  Procedure Laterality Date   APPLICATION OF CRANIAL NAVIGATION Right 03/29/2022   Procedure: APPLICATION OF CRANIAL NAVIGATION;  Surgeon: Jenkins, Jeffrey, MD;  Location: [facility] OR;  Service: Neurosurgery;  Laterality: Right;   COLONOSCOPY     CRANIOTOMY Right 03/29/2022   Procedure: CRANIOTOMY TUMOR EXCISION;  Surgeon: Jenkins, Jeffrey, MD;  Location: [facility] OR;  Service: Neurosurgery;   Laterality: Right;   DILATION AND CURETTAGE OF UTERUS     x2   EYE SURGERY     cataracts   HIP SURGERY  5/05   right hip replacement   HIP SURGERY  5/11   left hip replacement   ORIF HUMERUS FRACTURE Left 11/11/2017   Procedure: OPEN REDUCTION INTERNAL FIXATION (ORIF) LEFT DISTAL HUMERUS FRACTURE;  Surgeon: Haddix, Kevin P, MD;  Location: [facility] OR;  Service: Orthopedics;  Laterality: Left;   RHINOPLASTY     TONSILLECTOMY     TOTAL SHOULDER ARTHROPLASTY Left 04/01/2016   Procedure: LEFT TOTAL SHOULDER ARTHROPLASTY;  Surgeon: Kevin Supple, MD;  Location: [facility] OR;  Service: Orthopedics;  Laterality: Left;   Social History:  Social History   Socioeconomic History   Marital status: Widowed    Spouse name: Not on file   Number of children: Not on file   Years of education: Not on file   Highest education level: Not on file  Occupational History   Not on file  Tobacco Use   Smoking status: Never   Smokeless tobacco: Never  Vaping Use   Vaping Use: Never used  Substance and Sexual Activity   Alcohol use: No   Drug use: No   Sexual activity: Not Currently  Other Topics Concern   Not on file  Social History Narrative   Not on file   Social Determinants of Health   Financial Resource Strain: Low Risk  (04/12/2022)   Overall Financial Resource Strain (CARDIA)    Difficulty of Paying Living Expenses: Not hard at all  Food Insecurity: No Food Insecurity (03/23/2022)   Hunger Vital Sign    Worried About Running Out of Food in the Last Year: Never true    Ran Out of Food in the Last Year: Never true  Transportation Needs: No Transportation Needs (03/24/2022)   PRAPARE - Transportation    Lack of Transportation (Medical): No    Lack of Transportation (Non-Medical): No  Physical Activity: Not on file  Stress: Not on file  Social Connections: Not on file  Intimate Partner Violence: Not At Risk (03/24/2022)   Humiliation, Afraid, Rape, and Kick questionnaire    Fear of Current or  Ex-Partner: No    Emotionally Abused: No    Physically Abused: No    Sexually Abused: No   Family History:  Family History  Problem Relation Age of Onset   Heart disease Mother     Review of Systems: Constitutional: Doesn't report fevers, chills or abnormal weight loss Eyes: Doesn't report blurriness of vision Ears, nose, mouth, throat, and face: Doesn't report sore throat Respiratory: Doesn't report cough, dyspnea or wheezes Cardiovascular: Doesn't report palpitation, chest discomfort  Gastrointestinal:  Doesn't report nausea, constipation, diarrhea GU: Doesn't report incontinence Skin: Doesn't report skin rashes Neurological: Per HPI Musculoskeletal: Doesn't report joint pain Behavioral/Psych: Doesn't report anxiety  Physical   Exam: Vitals:   06/03/22 1159  BP: (!) 150/69  Pulse: (!) 105  Resp: 20  Temp: 97.7 F (36.5 C)  SpO2: 97%   KPS: 70. ECOG 1  General: Alert, cooperative, pleasant, in no acute distress Head: Normal EENT: No conjunctival injection or scleral icterus.  Lungs: Resp effort normal Cardiac: Regular rate Abdomen: Non-distended abdomen Skin: No rashes cyanosis or petechiae. Extremities: No clubbing or edema  Neurologic Exam: Mental Status: Awake, alert, attentive to examiner. Oriented to self and environment. Language is fluent with intact comprehension.  Cranial Nerves: Visual acuity is grossly normal. Visual fields are full. Extra-ocular movements intact. No ptosis. Face is symmetric Motor: Tone and bulk are normal. Power is full in both arms and legs. Reflexes are symmetric, no pathologic reflexes present.  Sensory: Intact to light touch Gait: Orthopedic and neurologic dystaxia   Labs: I have reviewed the data as listed    Component Value Date/Time   NA 137 03/29/2022 1655   K 3.1 (L) 03/29/2022 1655   CL 107 03/24/2022 0517   CO2 23 03/24/2022 0517   GLUCOSE 101 (H) 03/24/2022 0517   BUN 23 03/24/2022 0517   CREATININE 0.90  03/24/2022 0517   CALCIUM 9.6 03/24/2022 0517   CALCIUM 8.8 11/11/2017 0637   PROT 6.3 (L) 11/11/2017 0637   ALBUMIN 3.3 (L) 11/11/2017 0637   AST 19 11/11/2017 0637   ALT 18 11/11/2017 0637   ALKPHOS 73 11/11/2017 0637   BILITOT 0.8 11/11/2017 0637   GFRNONAA >60 03/24/2022 0517   GFRAA >60 11/11/2017 0637   Lab Results  Component Value Date   WBC 13.8 (H) 03/24/2022   NEUTROABS 9.7 (H) 03/24/2022   HGB 11.2 (L) 03/29/2022   HCT 33.0 (L) 03/29/2022   MCV 98.5 03/24/2022   PLT 267 03/24/2022      Assessment/Plan 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 ([place])  Lauren Hill is clinically stable, now having completed 1.5 weeks of 3 week course of IMRT without chemotherapy.  No new or progressive changes, doing well with treatment.  Will con't Keppra 590m BID given possible presentation with seizure.  We ask that EJabier Muttonreturn to clinic in 1 months following post-RT brain MRI, or sooner as needed.  All questions were answered. The patient knows to call the clinic with any problems, questions or concerns. No barriers to learning were detected.  The total time spent in the encounter was 30 minutes and more than 50% was on counseling and review of test results   ZVentura Sellers MD Medical Director of Neuro-Oncology [facility]12/28/23 12:08 PM

## 2022-06-04 ENCOUNTER — Other Ambulatory Visit: Payer: Self-pay

## 2022-06-04 ENCOUNTER — Ambulatory Visit
Admission: RE | Admit: 2022-06-04 | Discharge: 2022-06-04 | Disposition: A | Discharge status: Home / Self Care | Payer: PPO | Source: Ambulatory Visit | Attending: Radiation Oncology | Admitting: Radiation Oncology

## 2022-06-04 DIAGNOSIS — C712 Malignant neoplasm of temporal lobe: Secondary | ICD-10-CM | POA: Diagnosis not present

## 2022-06-04 DIAGNOSIS — C719 Malignant neoplasm of brain, unspecified: Secondary | ICD-10-CM | POA: Diagnosis not present

## 2022-06-04 DIAGNOSIS — Z510 Encounter for antineoplastic radiation therapy: Secondary | ICD-10-CM | POA: Diagnosis not present

## 2022-06-04 LAB — RAD ONC ARIA SESSION SUMMARY
Course Elapsed Days: 11
Plan Fractions Treated to Date: 9
Plan Prescribed Dose Per Fraction: 2.67 Gy
Plan Total Fractions Prescribed: 15
Plan Total Prescribed Dose: 40.05 Gy
Reference Point Dosage Given to Date: 24.03 Gy
Reference Point Session Dosage Given: 2.67 Gy
Session Number: 9

## 2022-06-08 ENCOUNTER — Other Ambulatory Visit: Payer: Self-pay

## 2022-06-08 ENCOUNTER — Ambulatory Visit
Admission: RE | Admit: 2022-06-08 | Discharge: 2022-06-08 | Disposition: A | Discharge status: Home / Self Care | Payer: PPO | Source: Ambulatory Visit | Attending: Radiation Oncology | Admitting: Radiation Oncology

## 2022-06-08 DIAGNOSIS — Z510 Encounter for antineoplastic radiation therapy: Secondary | ICD-10-CM | POA: Diagnosis not present

## 2022-06-08 DIAGNOSIS — C712 Malignant neoplasm of temporal lobe: Secondary | ICD-10-CM | POA: Diagnosis not present

## 2022-06-08 LAB — RAD ONC ARIA SESSION SUMMARY
Course Elapsed Days: 15
Plan Fractions Treated to Date: 10
Plan Prescribed Dose Per Fraction: 2.67 Gy
Plan Total Fractions Prescribed: 15
Plan Total Prescribed Dose: 40.05 Gy
Reference Point Dosage Given to Date: 26.7 Gy
Reference Point Session Dosage Given: 2.67 Gy
Session Number: 10

## 2022-06-09 ENCOUNTER — Ambulatory Visit
Admission: RE | Admit: 2022-06-09 | Discharge: 2022-06-09 | Disposition: A | Discharge status: Home / Self Care | Payer: PPO | Source: Ambulatory Visit | Attending: Radiation Oncology | Admitting: Radiation Oncology

## 2022-06-09 ENCOUNTER — Other Ambulatory Visit: Payer: Self-pay

## 2022-06-09 DIAGNOSIS — Z510 Encounter for antineoplastic radiation therapy: Secondary | ICD-10-CM | POA: Diagnosis not present

## 2022-06-09 DIAGNOSIS — C712 Malignant neoplasm of temporal lobe: Secondary | ICD-10-CM | POA: Diagnosis not present

## 2022-06-09 LAB — RAD ONC ARIA SESSION SUMMARY
Course Elapsed Days: 16
Plan Fractions Treated to Date: 11
Plan Prescribed Dose Per Fraction: 2.67 Gy
Plan Total Fractions Prescribed: 15
Plan Total Prescribed Dose: 40.05 Gy
Reference Point Dosage Given to Date: 29.37 Gy
Reference Point Session Dosage Given: 2.67 Gy
Session Number: 11

## 2022-06-10 ENCOUNTER — Other Ambulatory Visit: Payer: Self-pay

## 2022-06-10 ENCOUNTER — Ambulatory Visit: Payer: PPO | Admitting: Internal Medicine

## 2022-06-10 ENCOUNTER — Ambulatory Visit
Admission: RE | Admit: 2022-06-10 | Discharge: 2022-06-10 | Disposition: A | Discharge status: Home / Self Care | Payer: PPO | Source: Ambulatory Visit | Attending: Radiation Oncology | Admitting: Radiation Oncology

## 2022-06-10 ENCOUNTER — Other Ambulatory Visit: Payer: PPO

## 2022-06-10 DIAGNOSIS — Z96612 Presence of left artificial shoulder joint: Secondary | ICD-10-CM | POA: Diagnosis not present

## 2022-06-10 DIAGNOSIS — Z452 Encounter for adjustment and management of vascular access device: Secondary | ICD-10-CM | POA: Diagnosis not present

## 2022-06-10 DIAGNOSIS — D72829 Elevated white blood cell count, unspecified: Secondary | ICD-10-CM | POA: Diagnosis not present

## 2022-06-10 DIAGNOSIS — I10 Essential (primary) hypertension: Secondary | ICD-10-CM | POA: Diagnosis not present

## 2022-06-10 DIAGNOSIS — C712 Malignant neoplasm of temporal lobe: Secondary | ICD-10-CM | POA: Diagnosis not present

## 2022-06-10 DIAGNOSIS — Z510 Encounter for antineoplastic radiation therapy: Secondary | ICD-10-CM | POA: Diagnosis not present

## 2022-06-10 DIAGNOSIS — A419 Sepsis, unspecified organism: Secondary | ICD-10-CM | POA: Diagnosis not present

## 2022-06-10 DIAGNOSIS — Z9181 History of falling: Secondary | ICD-10-CM | POA: Diagnosis not present

## 2022-06-10 DIAGNOSIS — K219 Gastro-esophageal reflux disease without esophagitis: Secondary | ICD-10-CM | POA: Diagnosis not present

## 2022-06-10 DIAGNOSIS — G039 Meningitis, unspecified: Secondary | ICD-10-CM | POA: Diagnosis not present

## 2022-06-10 DIAGNOSIS — Z792 Long term (current) use of antibiotics: Secondary | ICD-10-CM | POA: Diagnosis not present

## 2022-06-10 DIAGNOSIS — E785 Hyperlipidemia, unspecified: Secondary | ICD-10-CM | POA: Diagnosis not present

## 2022-06-10 DIAGNOSIS — C719 Malignant neoplasm of brain, unspecified: Secondary | ICD-10-CM | POA: Diagnosis not present

## 2022-06-10 LAB — RAD ONC ARIA SESSION SUMMARY
Course Elapsed Days: 17
Plan Fractions Treated to Date: 12
Plan Prescribed Dose Per Fraction: 2.67 Gy
Plan Total Fractions Prescribed: 15
Plan Total Prescribed Dose: 40.05 Gy
Reference Point Dosage Given to Date: 32.04 Gy
Reference Point Session Dosage Given: 2.67 Gy
Session Number: 12

## 2022-06-11 ENCOUNTER — Other Ambulatory Visit: Payer: Self-pay

## 2022-06-11 ENCOUNTER — Ambulatory Visit
Admission: RE | Admit: 2022-06-11 | Discharge: 2022-06-11 | Disposition: A | Discharge status: Home / Self Care | Payer: PPO | Source: Ambulatory Visit | Attending: Radiation Oncology | Admitting: Radiation Oncology

## 2022-06-11 DIAGNOSIS — C712 Malignant neoplasm of temporal lobe: Secondary | ICD-10-CM | POA: Diagnosis not present

## 2022-06-11 DIAGNOSIS — Z510 Encounter for antineoplastic radiation therapy: Secondary | ICD-10-CM | POA: Diagnosis not present

## 2022-06-11 LAB — RAD ONC ARIA SESSION SUMMARY
Course Elapsed Days: 18
Plan Fractions Treated to Date: 13
Plan Prescribed Dose Per Fraction: 2.67 Gy
Plan Total Fractions Prescribed: 15
Plan Total Prescribed Dose: 40.05 Gy
Reference Point Dosage Given to Date: 34.71 Gy
Reference Point Session Dosage Given: 2.67 Gy
Session Number: 13

## 2022-06-14 ENCOUNTER — Other Ambulatory Visit: Payer: Self-pay

## 2022-06-14 ENCOUNTER — Inpatient Hospital Stay: Payer: PPO | Attending: Internal Medicine | Admitting: Nurse Practitioner

## 2022-06-14 ENCOUNTER — Ambulatory Visit
Admission: RE | Admit: 2022-06-14 | Discharge: 2022-06-14 | Disposition: A | Discharge status: Home / Self Care | Payer: PPO | Source: Ambulatory Visit | Attending: Radiation Oncology | Admitting: Radiation Oncology

## 2022-06-14 ENCOUNTER — Encounter: Payer: Self-pay | Admitting: Nurse Practitioner

## 2022-06-14 VITALS — BP 156/80 | HR 103 | Temp 97.6°F | Resp 16 | Wt 162.9 lb

## 2022-06-14 DIAGNOSIS — Z510 Encounter for antineoplastic radiation therapy: Secondary | ICD-10-CM | POA: Diagnosis not present

## 2022-06-14 DIAGNOSIS — R530 Neoplastic (malignant) related fatigue: Secondary | ICD-10-CM

## 2022-06-14 DIAGNOSIS — C712 Malignant neoplasm of temporal lobe: Secondary | ICD-10-CM

## 2022-06-14 DIAGNOSIS — Z515 Encounter for palliative care: Secondary | ICD-10-CM | POA: Diagnosis not present

## 2022-06-14 LAB — RAD ONC ARIA SESSION SUMMARY
Course Elapsed Days: 21
Plan Fractions Treated to Date: 14
Plan Prescribed Dose Per Fraction: 2.67 Gy
Plan Total Fractions Prescribed: 15
Plan Total Prescribed Dose: 40.05 Gy
Reference Point Dosage Given to Date: 37.38 Gy
Reference Point Session Dosage Given: 2.67 Gy
Session Number: 14

## 2022-06-14 NOTE — Progress Notes (Signed)
[facility]  Telephone:([phone] [phone]   Name: BRITTLEY REGNER Date: 06/14/2022 MRN: [id number]  DOB: 08/09/1934  Patient Care Team: Burnard Bunting, MD as PCP - General (Internal Medicine) Pickenpack-Cousar, Carlena Sax, NP as Nurse Practitioner (Nurse Practitioner)    INTERVAL HISTORY: Lauren Hill is a 88 y.o. female with  oncologic medical history including high grade glioma 04/2022 (right temporal) s/p craniotomy (03/2022). Currently receiving IV antibiotics via PICC due to recent meningitis infection. Palliative ask to see for symptom management and goals of care.   SOCIAL HISTORY:     reports that she has never smoked. She has never used smokeless tobacco. She reports that she does not drink alcohol and does not use drugs.  ADVANCE DIRECTIVES:    CODE STATUS: Full code  PAST MEDICAL HISTORY: Past Medical History:  Diagnosis Date   Arthritis    GERD (gastroesophageal reflux disease)    indigestion   History of blood transfusion    as an infant   History of hiatal hernia    History of pneumonia    Hyperlipidemia    Hypertension    Renal cyst     ALLERGIES:  has No Known Allergies.  MEDICATIONS:  Current Outpatient Medications  Medication Sig Dispense Refill   acetaminophen (TYLENOL) 650 MG CR tablet Take 1,300 mg by mouth in the morning, at noon, and at bedtime.     amLODipine (NORVASC) 5 MG tablet Take 5 mg by mouth 2 (two) times daily.      atenolol (TENORMIN) 50 MG tablet Take 50 mg by mouth at bedtime.     atorvastatin (LIPITOR) 10 MG tablet Take 10 mg by mouth daily at 6 PM.      cholecalciferol (VITAMIN D3) 25 MCG (1000 UNIT) tablet Take 1,000 Units by mouth daily.     famotidine (PEPCID) 10 MG tablet Take 10 mg by mouth at bedtime as needed for heartburn or indigestion.     levETIRAcetam (KEPPRA) 500 MG tablet Take 1 tablet (500 mg total) by mouth 2 (two) times daily. 60 tablet 2    magnesium gluconate (MAGONATE) 500 MG tablet Take 500 mg by mouth at bedtime. (Patient not taking: Reported on 05/11/2022)     Potassium 99 MG TABS Take 99 mg by mouth at bedtime.     No current facility-administered medications for this visit.    VITAL SIGNS: LMP 06/08/1983  There were no vitals filed for this visit.  Estimated body mass index is 30.01 kg/m as calculated from the following:   Height as of 05/11/22: '5\' 2"'$  (1.575 m).   Weight as of 06/03/22: 164 lb 1.6 oz (74.4 kg).   PERFORMANCE STATUS (ECOG) : 1 - Symptomatic but completely ambulatory   Physical Exam General: NAD, ambulatory with walker Cardiovascular: regular rate and rhythm Pulmonary: normal breathing patterns Abdomen: soft, nontender, + bowel sounds Extremities: no edema, no joint deformities Skin: no rashes Neurological: AAO x3  IMPRESSION: [name] presents to clinic today for follow-up. Her daughter, Lauren Hill is also present. No acute distress. Patient is remaining as active as possible. Tolerating radiation treatments. Her last treatment is scheduled for tomorrow.   Patient denies nausea, vomiting, constipation, or diarrhea. Appetite continues to be good. Her current weight is stable at 162 pounds.  We discussed Her current illness and what it means in the larger context of Her on-going co-morbidities. Natural disease trajectory and expectations were discussed.  Patient and family continues to take  things one day at a time while remaining hopeful for continued stability.  I discussed the importance of continued conversation with family and their medical providers regarding overall plan of care and treatment options, ensuring decisions are within the context of the patients values and GOCs.  PLAN:  Tolerate radiation treatments. no symptom management needs at this time.  Patient has a documented advanced directive. Her daughter Lauren Hill is her Scientist, research (medical).  Ongoing goals of care support.  Patient and family clear in their understanding of her condition. Remaining hopeful for stability. Quality of life is important to her. They plan to discuss options as a family while completing current antibiotic therapies. Will then make final decisions regarding how to proceed. I will plan to see patient back in 4-6 weeks in collaboration to other oncology appointments.  Patient and family knows to contact office sooner if needed.   Patient expressed understanding and was in agreement with this plan. She also understands that She can call the clinic at any time with any questions, concerns, or complaints.   Time Total: 20 min   Visit consisted of counseling and education dealing with the complex and emotionally intense issues of symptom management and palliative care in the setting of serious and potentially life-threatening illness.Greater than 50%  of this time was spent counseling and coordinating care related to the above assessment and plan.  Alda Lea, AGPCNP-BC  Palliative Medicine Team/[place]

## 2022-06-15 ENCOUNTER — Other Ambulatory Visit: Payer: Self-pay

## 2022-06-15 ENCOUNTER — Encounter: Payer: Self-pay | Admitting: Radiation Oncology

## 2022-06-15 ENCOUNTER — Ambulatory Visit
Admission: RE | Admit: 2022-06-15 | Discharge: 2022-06-15 | Disposition: A | Discharge status: Home / Self Care | Payer: PPO | Source: Ambulatory Visit | Attending: Radiation Oncology | Admitting: Radiation Oncology

## 2022-06-15 ENCOUNTER — Ambulatory Visit: Payer: PPO

## 2022-06-15 DIAGNOSIS — Z510 Encounter for antineoplastic radiation therapy: Secondary | ICD-10-CM | POA: Diagnosis not present

## 2022-06-15 DIAGNOSIS — C712 Malignant neoplasm of temporal lobe: Secondary | ICD-10-CM | POA: Diagnosis not present

## 2022-06-15 LAB — RAD ONC ARIA SESSION SUMMARY
Course Elapsed Days: 22
Plan Fractions Treated to Date: 15
Plan Prescribed Dose Per Fraction: 2.67 Gy
Plan Total Fractions Prescribed: 15
Plan Total Prescribed Dose: 40.05 Gy
Reference Point Dosage Given to Date: 40.05 Gy
Reference Point Session Dosage Given: 2.67 Gy
Session Number: 15

## 2022-06-15 NOTE — Progress Notes (Signed)
                                                                                                                                                            Patient Name: Lauren Hill MRN: [id number] DOB: [birth year] Referring Physician: Elissa Hefty Date of Service: 06/15/2022 [facility], [place]                                                        End Of Treatment Note  Diagnoses: C71.2-Malignant neoplasm of temporal lobe  Cancer Staging:  High grade Glioma of the Right Temporal lobe   Intent: Palliative  Radiation Treatment Dates: 05/24/2022 through 06/15/2022 Site Technique Total Dose (Gy) Dose per Fx (Gy) Completed Fx Beam Energies  Brain: Brain IMRT 40.05/40.05 2.67 15/15 6X   Narrative: The patient tolerated radiation therapy relatively well. Her course was delayed by a hospitalization for meningitis. She was able to finish her treatment. She developed fatigue and anticipated skin changes in the treatment field.  Plan: The patient will receive a call in about one month from the radiation oncology department. She will continue follow up with Dr. Barbaraann Cao as well.   ________________________________________________    Osker Mason, [facility]

## 2022-06-16 ENCOUNTER — Ambulatory Visit: Payer: PPO

## 2022-06-16 ENCOUNTER — Encounter: Payer: Self-pay | Admitting: Internal Medicine

## 2022-06-17 ENCOUNTER — Ambulatory Visit: Payer: PPO

## 2022-06-18 ENCOUNTER — Ambulatory Visit: Payer: PPO

## 2022-06-21 ENCOUNTER — Ambulatory Visit: Payer: PPO

## 2022-06-22 ENCOUNTER — Ambulatory Visit: Payer: PPO

## 2022-06-22 DIAGNOSIS — C712 Malignant neoplasm of temporal lobe: Secondary | ICD-10-CM | POA: Diagnosis not present

## 2022-06-23 ENCOUNTER — Ambulatory Visit: Payer: PPO

## 2022-06-23 DIAGNOSIS — C719 Malignant neoplasm of brain, unspecified: Secondary | ICD-10-CM | POA: Diagnosis not present

## 2022-06-23 DIAGNOSIS — G039 Meningitis, unspecified: Secondary | ICD-10-CM | POA: Diagnosis not present

## 2022-06-23 DIAGNOSIS — Z792 Long term (current) use of antibiotics: Secondary | ICD-10-CM | POA: Diagnosis not present

## 2022-06-23 DIAGNOSIS — D72829 Elevated white blood cell count, unspecified: Secondary | ICD-10-CM | POA: Diagnosis not present

## 2022-06-23 DIAGNOSIS — K219 Gastro-esophageal reflux disease without esophagitis: Secondary | ICD-10-CM | POA: Diagnosis not present

## 2022-06-23 DIAGNOSIS — I10 Essential (primary) hypertension: Secondary | ICD-10-CM | POA: Diagnosis not present

## 2022-06-23 DIAGNOSIS — Z9181 History of falling: Secondary | ICD-10-CM | POA: Diagnosis not present

## 2022-06-23 DIAGNOSIS — A419 Sepsis, unspecified organism: Secondary | ICD-10-CM | POA: Diagnosis not present

## 2022-06-23 DIAGNOSIS — Z96612 Presence of left artificial shoulder joint: Secondary | ICD-10-CM | POA: Diagnosis not present

## 2022-06-23 DIAGNOSIS — E785 Hyperlipidemia, unspecified: Secondary | ICD-10-CM | POA: Diagnosis not present

## 2022-06-23 DIAGNOSIS — Z452 Encounter for adjustment and management of vascular access device: Secondary | ICD-10-CM | POA: Diagnosis not present

## 2022-06-24 ENCOUNTER — Encounter (HOSPITAL_COMMUNITY): Payer: Self-pay

## 2022-06-24 ENCOUNTER — Telehealth: Payer: Self-pay | Admitting: *Deleted

## 2022-06-24 ENCOUNTER — Ambulatory Visit: Payer: PPO

## 2022-06-24 NOTE — Telephone Encounter (Signed)
Appeal letter created for [employer] for letter of medical necessity.

## 2022-06-25 ENCOUNTER — Ambulatory Visit: Payer: PPO

## 2022-06-28 ENCOUNTER — Ambulatory Visit: Payer: PPO

## 2022-06-28 ENCOUNTER — Other Ambulatory Visit: Payer: Self-pay | Admitting: Radiation Therapy

## 2022-06-29 ENCOUNTER — Ambulatory Visit: Payer: PPO

## 2022-06-30 ENCOUNTER — Ambulatory Visit: Payer: PPO

## 2022-07-01 ENCOUNTER — Ambulatory Visit: Payer: PPO

## 2022-07-02 ENCOUNTER — Ambulatory Visit: Payer: PPO

## 2022-07-05 ENCOUNTER — Ambulatory Visit: Payer: PPO

## 2022-07-06 ENCOUNTER — Ambulatory Visit: Payer: PPO

## 2022-07-08 ENCOUNTER — Emergency Department (HOSPITAL_COMMUNITY)
Admission: RE | Admit: 2022-07-08 | Discharge: 2022-07-08 | Disposition: A | Discharge status: Home / Self Care | Payer: PPO | Source: Ambulatory Visit | Attending: Internal Medicine | Admitting: Internal Medicine

## 2022-07-08 DIAGNOSIS — C719 Malignant neoplasm of brain, unspecified: Secondary | ICD-10-CM

## 2022-07-08 DIAGNOSIS — M19011 Primary osteoarthritis, right shoulder: Secondary | ICD-10-CM | POA: Diagnosis not present

## 2022-07-08 DIAGNOSIS — M5030 Other cervical disc degeneration, unspecified cervical region: Secondary | ICD-10-CM | POA: Diagnosis not present

## 2022-07-08 DIAGNOSIS — G936 Cerebral edema: Secondary | ICD-10-CM | POA: Diagnosis present

## 2022-07-08 DIAGNOSIS — R55 Syncope and collapse: Secondary | ICD-10-CM | POA: Diagnosis not present

## 2022-07-08 DIAGNOSIS — R569 Unspecified convulsions: Secondary | ICD-10-CM | POA: Diagnosis not present

## 2022-07-08 DIAGNOSIS — R600 Localized edema: Secondary | ICD-10-CM | POA: Diagnosis not present

## 2022-07-08 DIAGNOSIS — I4430 Unspecified atrioventricular block: Secondary | ICD-10-CM | POA: Diagnosis not present

## 2022-07-08 DIAGNOSIS — Z96643 Presence of artificial hip joint, bilateral: Secondary | ICD-10-CM | POA: Diagnosis present

## 2022-07-08 DIAGNOSIS — Z96612 Presence of left artificial shoulder joint: Secondary | ICD-10-CM | POA: Diagnosis present

## 2022-07-08 DIAGNOSIS — M2578 Osteophyte, vertebrae: Secondary | ICD-10-CM | POA: Diagnosis not present

## 2022-07-08 DIAGNOSIS — Z8249 Family history of ischemic heart disease and other diseases of the circulatory system: Secondary | ICD-10-CM | POA: Diagnosis not present

## 2022-07-08 DIAGNOSIS — K219 Gastro-esophageal reflux disease without esophagitis: Secondary | ICD-10-CM | POA: Diagnosis present

## 2022-07-08 DIAGNOSIS — I10 Essential (primary) hypertension: Secondary | ICD-10-CM | POA: Diagnosis present

## 2022-07-08 DIAGNOSIS — R220 Localized swelling, mass and lump, head: Secondary | ICD-10-CM | POA: Diagnosis not present

## 2022-07-08 DIAGNOSIS — M6282 Rhabdomyolysis: Secondary | ICD-10-CM | POA: Diagnosis present

## 2022-07-08 DIAGNOSIS — Z923 Personal history of irradiation: Secondary | ICD-10-CM | POA: Diagnosis not present

## 2022-07-08 DIAGNOSIS — Z043 Encounter for examination and observation following other accident: Secondary | ICD-10-CM | POA: Diagnosis not present

## 2022-07-08 DIAGNOSIS — Z79899 Other long term (current) drug therapy: Secondary | ICD-10-CM | POA: Diagnosis not present

## 2022-07-08 DIAGNOSIS — E785 Hyperlipidemia, unspecified: Secondary | ICD-10-CM | POA: Diagnosis present

## 2022-07-08 MED ORDER — GADOBUTROL 1 MMOL/ML IV SOLN
7.0000 mL | Freq: Once | INTRAVENOUS | Status: AC | PRN
  Administered 2022-07-08: 7 mL via INTRAVENOUS

## 2022-07-11 ENCOUNTER — Emergency Department (HOSPITAL_COMMUNITY): Payer: PPO

## 2022-07-11 ENCOUNTER — Encounter: Payer: Self-pay | Admitting: Internal Medicine

## 2022-07-11 ENCOUNTER — Other Ambulatory Visit: Payer: Self-pay

## 2022-07-11 ENCOUNTER — Encounter (HOSPITAL_COMMUNITY): Payer: Self-pay

## 2022-07-11 ENCOUNTER — Inpatient Hospital Stay (HOSPITAL_COMMUNITY)
Admission: EM | Admit: 2022-07-11 | Discharge: 2022-07-13 | DRG: 054 | Disposition: A | Discharge status: Home Health | Payer: PPO | Attending: Internal Medicine | Admitting: Internal Medicine

## 2022-07-11 DIAGNOSIS — I10 Essential (primary) hypertension: Secondary | ICD-10-CM | POA: Diagnosis present

## 2022-07-11 DIAGNOSIS — C719 Malignant neoplasm of brain, unspecified: Principal | ICD-10-CM | POA: Diagnosis present

## 2022-07-11 DIAGNOSIS — Z96612 Presence of left artificial shoulder joint: Secondary | ICD-10-CM | POA: Diagnosis present

## 2022-07-11 DIAGNOSIS — Y92009 Unspecified place in unspecified non-institutional (private) residence as the place of occurrence of the external cause: Secondary | ICD-10-CM

## 2022-07-11 DIAGNOSIS — G936 Cerebral edema: Secondary | ICD-10-CM | POA: Diagnosis present

## 2022-07-11 DIAGNOSIS — Z923 Personal history of irradiation: Secondary | ICD-10-CM | POA: Diagnosis not present

## 2022-07-11 DIAGNOSIS — Z96643 Presence of artificial hip joint, bilateral: Secondary | ICD-10-CM | POA: Diagnosis present

## 2022-07-11 DIAGNOSIS — R569 Unspecified convulsions: Secondary | ICD-10-CM | POA: Diagnosis not present

## 2022-07-11 DIAGNOSIS — Z8249 Family history of ischemic heart disease and other diseases of the circulatory system: Secondary | ICD-10-CM

## 2022-07-11 DIAGNOSIS — K219 Gastro-esophageal reflux disease without esophagitis: Secondary | ICD-10-CM | POA: Diagnosis present

## 2022-07-11 DIAGNOSIS — M6282 Rhabdomyolysis: Secondary | ICD-10-CM | POA: Diagnosis present

## 2022-07-11 DIAGNOSIS — W19XXXA Unspecified fall, initial encounter: Principal | ICD-10-CM

## 2022-07-11 DIAGNOSIS — E785 Hyperlipidemia, unspecified: Secondary | ICD-10-CM | POA: Diagnosis present

## 2022-07-11 DIAGNOSIS — R748 Abnormal levels of other serum enzymes: Secondary | ICD-10-CM

## 2022-07-11 DIAGNOSIS — Z79899 Other long term (current) drug therapy: Secondary | ICD-10-CM | POA: Diagnosis not present

## 2022-07-11 LAB — CBG MONITORING, ED: Glucose-Capillary: 137 mg/dL — ABNORMAL HIGH (ref 70–99)

## 2022-07-11 LAB — URINALYSIS, ROUTINE W REFLEX MICROSCOPIC
Bacteria, UA: NONE SEEN
Bilirubin Urine: NEGATIVE
Glucose, UA: NEGATIVE mg/dL
Hgb urine dipstick: NEGATIVE
Ketones, ur: 20 mg/dL — AB
Leukocytes,Ua: NEGATIVE
Nitrite: NEGATIVE
Protein, ur: NEGATIVE mg/dL
Specific Gravity, Urine: 1.015 (ref 1.005–1.030)
pH: 6 (ref 5.0–8.0)

## 2022-07-11 LAB — CBC WITH DIFFERENTIAL/PLATELET
Abs Immature Granulocytes: 0.05 10*3/uL (ref 0.00–0.07)
Basophils Absolute: 0 10*3/uL (ref 0.0–0.1)
Basophils Relative: 0 %
Eosinophils Absolute: 0 10*3/uL (ref 0.0–0.5)
Eosinophils Relative: 0 %
HCT: 41.1 % (ref 36.0–46.0)
Hemoglobin: 13.8 g/dL (ref 12.0–15.0)
Immature Granulocytes: 0 %
Lymphocytes Relative: 5 %
Lymphs Abs: 0.8 10*3/uL (ref 0.7–4.0)
MCH: 31.7 pg (ref 26.0–34.0)
MCHC: 33.6 g/dL (ref 30.0–36.0)
MCV: 94.3 fL (ref 80.0–100.0)
Monocytes Absolute: 0.8 10*3/uL (ref 0.1–1.0)
Monocytes Relative: 5 %
Neutro Abs: 13.8 10*3/uL — ABNORMAL HIGH (ref 1.7–7.7)
Neutrophils Relative %: 90 %
Platelets: 250 10*3/uL (ref 150–400)
RBC: 4.36 MIL/uL (ref 3.87–5.11)
RDW: 13.2 % (ref 11.5–15.5)
WBC: 15.5 10*3/uL — ABNORMAL HIGH (ref 4.0–10.5)
nRBC: 0 % (ref 0.0–0.2)

## 2022-07-11 LAB — COMPREHENSIVE METABOLIC PANEL
ALT: 22 U/L (ref 0–44)
AST: 40 U/L (ref 15–41)
Albumin: 4 g/dL (ref 3.5–5.0)
Alkaline Phosphatase: 58 U/L (ref 38–126)
Anion gap: 10 (ref 5–15)
BUN: 25 mg/dL — ABNORMAL HIGH (ref 8–23)
CO2: 24 mmol/L (ref 22–32)
Calcium: 9.3 mg/dL (ref 8.9–10.3)
Chloride: 103 mmol/L (ref 98–111)
Creatinine, Ser: 0.84 mg/dL (ref 0.44–1.00)
GFR, Estimated: >60 mL/min (ref >60)
Glucose, Bld: 133 mg/dL — ABNORMAL HIGH (ref 70–99)
Potassium: 3.8 mmol/L (ref 3.5–5.1)
Sodium: 137 mmol/L (ref 135–145)
Total Bilirubin: 0.9 mg/dL (ref 0.3–1.2)
Total Protein: 7.6 g/dL (ref 6.5–8.1)

## 2022-07-11 LAB — CK: Total CK: 734 U/L — ABNORMAL HIGH (ref 38–234)

## 2022-07-11 LAB — LACTIC ACID, PLASMA: Lactic Acid, Venous: 1.5 mmol/L (ref 0.5–1.9)

## 2022-07-11 MED ORDER — IBUPROFEN 200 MG PO TABS: 600.0000 mg | ORAL_TABLET | Freq: Once | ORAL | Status: DC

## 2022-07-11 MED ORDER — ATENOLOL 50 MG PO TABS: 50.0000 mg | ORAL_TABLET | Freq: Once | ORAL | Status: DC

## 2022-07-11 MED ORDER — ATENOLOL 50 MG PO TABS
50.0000 mg | ORAL_TABLET | Freq: Every day | ORAL | Status: DC
  Administered 2022-07-12 (×2): 50 mg via ORAL
  Filled 2022-07-11 (×2): qty 1

## 2022-07-11 MED ORDER — SODIUM CHLORIDE 0.9 % IV SOLN: INTRAVENOUS | Status: DC

## 2022-07-11 MED ORDER — LEVETIRACETAM 500 MG PO TABS
500.0000 mg | ORAL_TABLET | Freq: Two times a day (BID) | ORAL | Status: DC
  Administered 2022-07-12 – 2022-07-13 (×4): 500 mg via ORAL
  Filled 2022-07-11 (×4): qty 1

## 2022-07-11 MED ORDER — ACETAMINOPHEN 650 MG RE SUPP: 650.0000 mg | Freq: Four times a day (QID) | RECTAL | Status: DC | PRN

## 2022-07-11 MED ORDER — SODIUM CHLORIDE 0.9 % IV SOLN: INTRAVENOUS | Status: AC

## 2022-07-11 MED ORDER — SODIUM CHLORIDE 0.9 % IV BOLUS
1000.0000 mL | Freq: Once | INTRAVENOUS | Status: AC
  Administered 2022-07-11: 1000 mL via INTRAVENOUS

## 2022-07-11 MED ORDER — SENNOSIDES-DOCUSATE SODIUM 8.6-50 MG PO TABS: 1.0000 | ORAL_TABLET | Freq: Every evening | ORAL | Status: DC | PRN

## 2022-07-11 MED ORDER — DEXAMETHASONE SODIUM PHOSPHATE 4 MG/ML IJ SOLN: 4.0000 mg | Freq: Once | INTRAMUSCULAR | Status: DC

## 2022-07-11 MED ORDER — AMLODIPINE BESYLATE 5 MG PO TABS
5.0000 mg | ORAL_TABLET | Freq: Two times a day (BID) | ORAL | Status: DC
  Administered 2022-07-12 – 2022-07-13 (×4): 5 mg via ORAL
  Filled 2022-07-11 (×4): qty 1

## 2022-07-11 MED ORDER — ONDANSETRON HCL 4 MG/2ML IJ SOLN: 4.0000 mg | Freq: Four times a day (QID) | INTRAMUSCULAR | Status: DC | PRN

## 2022-07-11 MED ORDER — ONDANSETRON HCL 4 MG PO TABS: 4.0000 mg | ORAL_TABLET | Freq: Four times a day (QID) | ORAL | Status: DC | PRN

## 2022-07-11 MED ORDER — ACETAMINOPHEN 325 MG PO TABS: 650.0000 mg | ORAL_TABLET | Freq: Four times a day (QID) | ORAL | Status: DC | PRN

## 2022-07-11 MED ORDER — LEVETIRACETAM 500 MG PO TABS: 500.0000 mg | ORAL_TABLET | Freq: Once | ORAL | Status: DC

## 2022-07-11 MED ORDER — DEXAMETHASONE SODIUM PHOSPHATE 4 MG/ML IJ SOLN
4.0000 mg | Freq: Four times a day (QID) | INTRAMUSCULAR | Status: DC
  Administered 2022-07-11 – 2022-07-13 (×7): 4 mg via INTRAVENOUS
  Filled 2022-07-11 (×7): qty 1

## 2022-07-11 MED ORDER — SODIUM CHLORIDE 0.9% FLUSH
3.0000 mL | Freq: Two times a day (BID) | INTRAVENOUS | Status: DC
  Administered 2022-07-12 – 2022-07-13 (×3): 3 mL via INTRAVENOUS

## 2022-07-11 MED ORDER — ATORVASTATIN CALCIUM 10 MG PO TABS
10.0000 mg | ORAL_TABLET | Freq: Every day | ORAL | Status: DC
  Administered 2022-07-12: 10 mg via ORAL
  Filled 2022-07-11: qty 1

## 2022-07-11 MED ORDER — ENOXAPARIN SODIUM 40 MG/0.4ML IJ SOSY
40.0000 mg | PREFILLED_SYRINGE | INTRAMUSCULAR | Status: DC
  Administered 2022-07-11 – 2022-07-12 (×2): 40 mg via SUBCUTANEOUS
  Filled 2022-07-11 (×2): qty 0.4

## 2022-07-11 NOTE — H&P (Signed)
History and Physical    Lauren Hill:[id number] DOB: April 16, 1935 DOA: 07/11/2022  PCP: Burnard Bunting, MD  Patient coming from: Home  I have personally briefly reviewed patient's old medical records in [place]  Chief Complaint: Fall at home, left-sided weakness  HPI: Lauren Hill is a 88 y.o. 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  Patient states she has had 2 falls at home without significant injury but inability to get up on her own.  She fell last night and and was found facedown on the ground by family.  She was apparently down at least the whole night.  She does not know if she lost consciousness.  Daughter did note that she has had weakness in her left arm and left leg, which is new over the last few days.  She has been dragging her left leg when walking.  She did complete 3 weeks of radiation therapy with last treatment on 06/15/2022.  Follow-up MRI 07/08/2022 showed much progressed enhancement and T2 signal with swelling at the right temporal lobe mass with new ependymal enhancement at the temporal horn of the right lateral ventricle.  Midline shift measures 5 mm.  Of note, patient was recently admitted at [facility] from 04/29/2022-05/07/2022 for culture-negative presumed bacterial meningitis which was treated with prolonged course of IV antibiotics.  ED Course  Labs/Imaging on admission: I have personally reviewed following labs and imaging studies.  Initial vitals showed BP 137/80, pulse 100, RR 20, temp 98.0 F, SpO2 95% on room air.  Labs show WBC 15.5, hemoglobin 13.8, platelets 250,000, sodium 137, potassium 3.8, bicarb 24, BUN 25, creatinine 0.84, serum glucose 133, LFTs within normal limits, CK7 194, lactic acid 1.5.  UA negative for  UTI.  Portable chest x-ray shows low lung volumes without acute findings.  Pelvic x-ray negative for acute findings, s/p bilateral hip arthroplasty changes noted.  CT head again shows severe diffuse edema involving the mid and posterior right cerebral hemisphere with mass effect on the right lateral ventricle and right to left midline shift of approximately 0.6 cm.  No hemorrhage or other new finding.  CT C-spine negative for fracture or static subluxation.  Patient was given 1 L normal saline.  EDP discussed with neuro-oncology, Dr. Mickeal Skinner, who recommended IV Decadron 4 mg every 6 hours.  The hospitalist service was consulted to admit for further evaluation and management.  Review of Systems: All systems reviewed and are negative except as documented in history of present illness above.   Past Medical History:  Diagnosis Date   Arthritis    GERD (gastroesophageal reflux disease)    indigestion   History of blood transfusion    as an infant   History of hiatal hernia    History of pneumonia    Hyperlipidemia    Hypertension    Renal cyst     Past Surgical History:  Procedure Laterality Date   APPLICATION OF CRANIAL NAVIGATION Right 03/29/2022   Procedure: APPLICATION OF CRANIAL NAVIGATION;  Surgeon: Newman Pies, MD;  Location: [place];  Service: Neurosurgery;  Laterality: Right;   COLONOSCOPY     CRANIOTOMY Right 03/29/2022   Procedure: CRANIOTOMY TUMOR EXCISION;  Surgeon: Newman Pies, MD;  Location: [place];  Service: Neurosurgery;  Laterality: Right;   DILATION AND CURETTAGE OF UTERUS  x2   EYE SURGERY     cataracts   HIP SURGERY  5/05   right hip replacement   HIP SURGERY  5/11   left hip replacement   ORIF HUMERUS FRACTURE Left 11/11/2017   Procedure: OPEN REDUCTION INTERNAL FIXATION (ORIF) LEFT DISTAL HUMERUS FRACTURE;  Surgeon: Shona Needles, MD;  Location: [place];  Service: Orthopedics;  Laterality: Left;   RHINOPLASTY     TONSILLECTOMY     TOTAL  SHOULDER ARTHROPLASTY Left 04/01/2016   Procedure: LEFT TOTAL SHOULDER ARTHROPLASTY;  Surgeon: Justice Britain, MD;  Location: [place];  Service: Orthopedics;  Laterality: Left;    Social History:  reports that she has never smoked. She has never used smokeless tobacco. She reports that she does not drink alcohol and does not use drugs.  No Known Allergies  Family History  Problem Relation Age of Onset   Heart disease Mother      Prior to Admission medications   Medication Sig Start Date End Date Taking? Authorizing Provider  acetaminophen (TYLENOL) 650 MG CR tablet Take 1,300 mg by mouth in the morning, at noon, and at bedtime.    [provider]  amLODipine (NORVASC) 5 MG tablet Take 5 mg by mouth 2 (two) times daily.  01/12/16   [provider]  atenolol (TENORMIN) 50 MG tablet Take 50 mg by mouth at bedtime.    [provider]  atorvastatin (LIPITOR) 10 MG tablet Take 10 mg by mouth daily at 6 PM.     [provider]  cholecalciferol (VITAMIN D3) 25 MCG (1000 UNIT) tablet Take 1,000 Units by mouth daily.    [provider]  famotidine (PEPCID) 10 MG tablet Take 10 mg by mouth at bedtime as needed for heartburn or indigestion.    [provider]  levETIRAcetam (KEPPRA) 500 MG tablet Take 1 tablet (500 mg total) by mouth 2 (two) times daily. 05/11/22   Ventura Sellers, MD  magnesium gluconate (MAGONATE) 500 MG tablet Take 500 mg by mouth at bedtime. Patient not taking: Reported on 05/11/2022    [provider]  Potassium 99 MG TABS Take 99 mg by mouth at bedtime.    [provider]    Physical Exam: Vitals:   07/11/22 1800 07/11/22 1815 07/11/22 1830 07/11/22 1845  BP: (!) 140/71     Pulse: (!) 106 (!) 101 (!) 104 (!) 104  Resp:      Temp:      TempSrc:      SpO2: 96% 98% 95% 94%  Weight:      Height:       Constitutional: Chronically ill-appearing woman resting in bed with head elevated.  Appears fatigued but  in NAD, calm, comfortable Head: Well-healed postsurgical right temporal craniotomy scar Eyes: EOMI, lids and conjunctivae normal ENMT: Mucous membranes are dry. Posterior pharynx clear of any exudate or lesions.Normal dentition.  Neck: normal, supple, no masses. Respiratory: clear to auscultation bilaterally, no wheezing, no crackles. Normal respiratory effort. No accessory muscle use.  Cardiovascular: Regular rate and rhythm, no murmurs / rubs / gallops. No extremity edema. 2+ pedal pulses. Abdomen: no tenderness, no masses palpated.  Musculoskeletal: no clubbing / cyanosis. No joint deformity upper and lower extremities. Good ROM, no contractures. Normal muscle tone.  Skin: no rashes, lesions, ulcers. No induration Neurologic: CN 2-12 grossly intact. Sensation intact. Strength 5/5 in RUE and RLE 4/5 LUE and LLE. Psychiatric:  Alert and oriented x 3.   EKG: Personally reviewed.  Sinus rhythm, rate 99, first-degree AV block, no acute ischemic changes.  Similar to prior.  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  Assessment and Plan: Right temporal high-grade glioma s/p craniotomy (03/2022): Completed 3 weeks radiation treatment last month.  Brain imaging shows severe diffuse edema involving the posterior right cerebral hemisphere with mass effect on right lateral ventricle.  Likely contributing to patient's left-sided weakness.  EDP spoke with neuro-oncology who recommended starting IV Decadron. -IV Decadron 4 mg  every 6 hours -Continue home Keppra 500 mg twice daily  Fall at home with elevated CK: Fall without injury secondary to left-sided weakness in setting of right cerebral edema as above.  Prolonged downtime with CK elevated at 734. -IV fluid hydration overnight -Repeat CK level in a.m. -Monitor renal function, UOP -PT/OT eval, fall precautions  Hypertension: Continue amlodipine and atenolol.  Hyperlipidemia: Continue atorvastatin.   DVT prophylaxis: enoxaparin (LOVENOX) injection 40 mg Start: 07/11/22 1945 Code Status: Full code, discussed with patient on admission Family Communication: Daughter at bedside Disposition Plan: From home, dispo pending clinical progress Consults called: EDP discussed with neuro-oncology Severity of Illness: The appropriate patient status for this patient is INPATIENT. Inpatient status is judged to be reasonable and necessary in order to provide the required intensity of service to ensure the patient's safety. The patient's presenting symptoms, physical exam findings, and initial radiographic and laboratory data in the context of their chronic comorbidities is felt to place them at high risk for further clinical deterioration. Furthermore, it is not anticipated that the patient will be medically stable for discharge from the hospital within 2 midnights of admission.   * I certify that at the point of admission it is my clinical judgment that the patient will require inpatient hospital care spanning beyond 2 midnights from the point of admission due to high intensity of service, high risk for further deterioration and high frequency of surveillance required.Zada Finders MD [facility]  If 7PM-7AM, please contact night-coverage [email or web address]  07/11/2022, 7:46 PM

## 2022-07-11 NOTE — ED Notes (Signed)
Attempted to ambulate pt. Was only able to stand pt. Pt not able to take any steps. Pt sts "my back and knees hurt to much to walk". Pt assisted back to bed.

## 2022-07-11 NOTE — ED Provider Notes (Signed)
[place] [facility] AT [facility] Provider Note   CSN: [id number] Arrival date & time: 07/11/22  1302     History  Chief Complaint  Patient presents with   Lytle Michaels    Lauren Hill is a 88 y.o. female.  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  She finished her final radiation tx last month.  She lives alone.  This am, her daughter tried calling her and pt did not answer.  Another family member went to check on her and found her on the ground, face down in her bedroom.  Pt does not remember how she ended up on the ground.  Pt said she feels fine and nothing hurts.     Daughter said pt has had increased weakness in left arm and left leg        Home Medications Prior to Admission medications   Medication Sig Start Date End Date Taking? Authorizing Provider  acetaminophen (TYLENOL) 650 MG CR tablet Take 1,300 mg by mouth in the morning, at noon, and at bedtime.    [provider]  amLODipine (NORVASC) 5 MG tablet Take 5 mg by mouth 2 (two) times daily.  01/12/16   [provider]  atenolol (TENORMIN) 50 MG tablet Take 50 mg by mouth at bedtime.    [provider]  atorvastatin (LIPITOR) 10 MG tablet Take 10 mg by mouth daily at 6 PM.     [provider]  cholecalciferol (VITAMIN D3) 25 MCG (1000 UNIT) tablet Take 1,000 Units by mouth daily.    [provider]  famotidine (PEPCID) 10 MG tablet Take 10 mg by mouth at bedtime as needed for heartburn or indigestion.    [provider]  levETIRAcetam (KEPPRA) 500 MG tablet Take 1 tablet (500 mg total) by mouth 2 (two) times daily. 05/11/22   Ventura Sellers, MD  magnesium gluconate (MAGONATE) 500 MG tablet Take 500 mg by mouth at bedtime. Patient not taking: Reported on 05/11/2022    [provider]  Potassium 99 MG TABS Take 99 mg by mouth at bedtime.     [provider]      Allergies    Patient has no known allergies.    Review of Systems   Review of Systems  Neurological:  Positive for weakness.  All other systems reviewed and are negative.   Physical Exam Updated Vital Signs BP (!) 154/69   Pulse 98   Temp 98.1 F (36.7 C)   Resp (!) 22   Ht '5\' 2"'$  (1.575 m)   Wt 73 kg   LMP 06/08/1983   SpO2 95%   BMI 29.44 kg/m  Physical Exam Vitals and nursing note reviewed.  Constitutional:      Appearance: Normal appearance.  HENT:     Head: Normocephalic.     Comments: Contusion to cheek and chin    Right Ear: External ear normal.     Left Ear: External ear normal.     Nose: Nose normal.     Mouth/Throat:     Mouth: Mucous membranes are dry.  Eyes:     Extraocular Movements: Extraocular movements intact.     Conjunctiva/sclera: Conjunctivae normal.     Pupils: Pupils are equal, round, and reactive to light.  Neck:     Comments: In c-collar Cardiovascular:     Rate and Rhythm: Regular rhythm. Tachycardia  present.     Pulses: Normal pulses.     Heart sounds: Normal heart sounds.  Pulmonary:     Effort: Pulmonary effort is normal.     Breath sounds: Normal breath sounds.  Abdominal:     General: Abdomen is flat. Bowel sounds are normal.     Palpations: Abdomen is soft.  Musculoskeletal:        General: Normal range of motion.  Skin:    General: Skin is warm.     Capillary Refill: Capillary refill takes less than 2 seconds.  Neurological:     General: No focal deficit present.     Mental Status: She is alert and oriented to person, place, and time.  Psychiatric:        Mood and Affect: Mood normal.        Behavior: Behavior normal.     ED Results / Procedures / Treatments   Labs (all labs ordered are listed, but only abnormal results are displayed) Labs Reviewed  CBC WITH DIFFERENTIAL/PLATELET - Abnormal; Notable for the following components:      Result Value   WBC 15.5 (*)    Neutro Abs 13.8 (*)     All other components within normal limits  COMPREHENSIVE METABOLIC PANEL - Abnormal; Notable for the following components:   Glucose, Bld 133 (*)    BUN 25 (*)    All other components within normal limits  URINALYSIS, ROUTINE W REFLEX MICROSCOPIC - Abnormal; Notable for the following components:   Ketones, ur 20 (*)    All other components within normal limits  CK - Abnormal; Notable for the following components:   Total CK 734 (*)    All other components within normal limits  CBG MONITORING, ED - Abnormal; Notable for the following components:   Glucose-Capillary 137 (*)    All other components within normal limits  LACTIC ACID, PLASMA    EKG EKG Interpretation  Date/Time:  Sunday July 11 2022 13:12:26 EST Ventricular Rate:  99 PR Interval:  209 QRS Duration: 95 QT Interval:  375 QTC Calculation: 482 R Axis:   -31 Text Interpretation: Sinus rhythm Borderline prolonged PR interval Probable left atrial enlargement Left axis deviation Consider anterior infarct No significant change since last tracing Confirmed by Isla Pence [id number]) on 07/11/2022 1:33:09 PM  Radiology CT HEAD WO CONTRAST  Result Date: 07/11/2022 CLINICAL DATA:  Fall, history of glioblastoma EXAM: CT HEAD WITHOUT CONTRAST CT CERVICAL SPINE WITHOUT CONTRAST TECHNIQUE: Multidetector CT imaging of the head and cervical spine was performed following the standard protocol without intravenous contrast. Multiplanar CT image reconstructions of the cervical spine were also generated. RADIATION DOSE REDUCTION: This exam was performed according to the departmental dose-optimization program which includes automated exposure control, adjustment of the mA and/or kV according to patient size and/or use of iterative reconstruction technique. COMPARISON:  MR brain, 07/08/2022 FINDINGS: CT HEAD FINDINGS Brain: Unchanged appearance of the brain, compared to recent prior MR, with severe, diffuse edema involving the mid and  posterior right cerebral hemisphere, with mass effect on the right lateral ventricle and right-to-left midline shift of approximately 0.6 cm, as well as underlying periventricular and deep white matter hypodensity. No hemorrhage or other new finding. Vascular: No hyperdense vessel or unexpected calcification. Skull: Status post right parietal craniotomy. Negative for fracture or focal lesion. Sinuses/Orbits: No acute finding. Other: None. CT CERVICAL SPINE FINDINGS Alignment: Normal. Skull base and vertebrae: No acute fracture. No primary bone lesion or focal pathologic process. Soft tissues  and spinal canal: No prevertebral fluid or swelling. No visible canal hematoma. Disc levels: Severe multilevel disc space height loss and osteophytosis from C4-C7. Upper chest: Negative. Other: None. IMPRESSION: 1. Unchanged appearance of the brain, compared to recent prior MR, with severe, diffuse edema involving the mid and posterior right cerebral hemisphere, with mass effect on the right lateral ventricle and right-to-left midline shift of approximately 0.6 cm, as well as underlying periventricular and deep white matter hypodensity. Findings are in keeping with known glioblastoma. 2. No hemorrhage or other new finding. 3. No fracture or static subluxation of the cervical spine. 4. Severe multilevel cervical disc degenerative disease. Electronically Signed   By: Delanna Ahmadi M.D.   On: 07/11/2022 14:22   CT CERVICAL SPINE WO CONTRAST  Result Date: 07/11/2022 CLINICAL DATA:  Fall, history of glioblastoma EXAM: CT HEAD WITHOUT CONTRAST CT CERVICAL SPINE WITHOUT CONTRAST TECHNIQUE: Multidetector CT imaging of the head and cervical spine was performed following the standard protocol without intravenous contrast. Multiplanar CT image reconstructions of the cervical spine were also generated. RADIATION DOSE REDUCTION: This exam was performed according to the departmental dose-optimization program which includes automated  exposure control, adjustment of the mA and/or kV according to patient size and/or use of iterative reconstruction technique. COMPARISON:  MR brain, 07/08/2022 FINDINGS: CT HEAD FINDINGS Brain: Unchanged appearance of the brain, compared to recent prior MR, with severe, diffuse edema involving the mid and posterior right cerebral hemisphere, with mass effect on the right lateral ventricle and right-to-left midline shift of approximately 0.6 cm, as well as underlying periventricular and deep white matter hypodensity. No hemorrhage or other new finding. Vascular: No hyperdense vessel or unexpected calcification. Skull: Status post right parietal craniotomy. Negative for fracture or focal lesion. Sinuses/Orbits: No acute finding. Other: None. CT CERVICAL SPINE FINDINGS Alignment: Normal. Skull base and vertebrae: No acute fracture. No primary bone lesion or focal pathologic process. Soft tissues and spinal canal: No prevertebral fluid or swelling. No visible canal hematoma. Disc levels: Severe multilevel disc space height loss and osteophytosis from C4-C7. Upper chest: Negative. Other: None. IMPRESSION: 1. Unchanged appearance of the brain, compared to recent prior MR, with severe, diffuse edema involving the mid and posterior right cerebral hemisphere, with mass effect on the right lateral ventricle and right-to-left midline shift of approximately 0.6 cm, as well as underlying periventricular and deep white matter hypodensity. Findings are in keeping with known glioblastoma. 2. No hemorrhage or other new finding. 3. No fracture or static subluxation of the cervical spine. 4. Severe multilevel cervical disc degenerative disease. Electronically Signed   By: Delanna Ahmadi M.D.   On: 07/11/2022 14:22   DG Pelvis 1-2 Views  Result Date: 07/11/2022 CLINICAL DATA:  Status post fall. EXAM: PELVIS - 1-2 VIEW COMPARISON:  10/27/2009 FINDINGS: The patient is status post bilateral hip arthroplasty. The hardware components are  in anatomic alignment without signs of fracture or dislocation. No signs of pelvic fracture or diastasis. IMPRESSION: Status post bilateral hip arthroplasty. No acute findings. Electronically Signed   By: Kerby Moors M.D.   On: 07/11/2022 14:02   DG Chest Portable 1 View  Result Date: 07/11/2022 CLINICAL DATA:  Status post fall. EXAM: PORTABLE CHEST 1 VIEW COMPARISON:  03/23/2022 FINDINGS: Normal heart size. No pleural effusion or edema. Decreased lung volumes. No airspace opacities. Severe degenerative changes noted in the right glenohumeral joint. Prior left shoulder arthroplasty. IMPRESSION: Low lung volumes. No acute findings. Electronically Signed   By: Queen Slough.D.  On: 07/11/2022 14:00    Procedures Procedures    Medications Ordered in ED Medications  sodium chloride 0.9 % bolus 1,000 mL (0 mLs Intravenous Stopped 07/11/22 1625)    And  0.9 %  sodium chloride infusion (0 mLs Intravenous Hold 07/11/22 1625)  ibuprofen (ADVIL) tablet 600 mg (has no administration in time range)  levETIRAcetam (KEPPRA) tablet 500 mg (has no administration in time range)  atenolol (TENORMIN) tablet 50 mg (has no administration in time range)  dexamethasone (DECADRON) injection 4 mg (has no administration in time range)    ED Course/ Medical Decision Making/ A&P                             Medical Decision Making Amount and/or Complexity of Data Reviewed Labs: ordered. Radiology: ordered.  Risk OTC drugs. Prescription drug management. Decision regarding hospitalization.   This patient presents to the ED for concern of fall, this involves an extensive number of treatment options, and is a complaint that carries with it a high risk of complications and morbidity.  The differential diagnosis includes multiple trauma, seizure, rhabdo, electrolyte abn   Co morbidities that complicate the patient evaluation  htn, hld, renal cyst, arthritis, gerd, and  right temporal glioma s/p  craniotomy   Additional history obtained:  Additional history obtained from epic chart review External records from outside source obtained and reviewed including family   Lab Tests:  I Ordered, and personally interpreted labs.  The pertinent results include:  cbc with wbc elevated at 15.5, cmp nl, lactic 1.5, ck 734   Imaging Studies ordered:  I ordered imaging studies including ct head/ct c-spine  I independently visualized and interpreted imaging which showed  CT head/c-spine: Unchanged appearance of the brain, compared to recent prior MR,  with severe, diffuse edema involving the mid and posterior right  cerebral hemisphere, with mass effect on the right lateral ventricle  and right-to-left midline shift of approximately 0.6 cm, as well as  underlying periventricular and deep white matter hypodensity.  Findings are in keeping with known glioblastoma.  2. No hemorrhage or other new finding.  3. No fracture or static subluxation of the cervical spine.  4. Severe multilevel cervical disc degenerative disease.  CXR: Low lung volumes. No acute findings.  Pelvis: Status post bilateral hip arthroplasty. No acute findings.  I agree with the radiologist interpretation I reviewed MRI from 2/1 which showed: Much progressed enhancement and T2 signal with swelling at the right temporal lobe mass with new ependymal enhancement at the temporal horn of the right lateral ventricle. Midline shift measures 5 mm.   Cardiac Monitoring:  The patient was maintained on a cardiac monitor.  I personally viewed and interpreted the cardiac monitored which showed an underlying rhythm of: nsr   Medicines ordered and prescription drug management:  I ordered medication including IVFs  for dehydration  Reevaluation of the patient after these medicines showed that the patient improved I have reviewed the patients home medicines and have made adjustments as needed   Test  Considered:  Mri/ct   Critical Interventions:  ivfs   Consultations Obtained:  I requested consultation with the neuro-oncologist,  and discussed lab and imaging findings as well as pertinent plan - he recommends decadron 4 mg IV q 6 hrs Pt d/w Dr. Posey Pronto (triad) for admission   Problem List / ED Course:  Fall:  no fx.  Likely from left sided weakness due to vasogenic  edema.  Pt is unable to ambulate here.   Vasogenic edema:  decadron 4 mg iv q 6 hr Mild rhabdo:  will likely resolve with ivfs.  No myoglobin in urine.   Reevaluation:  After the interventions noted above, I reevaluated the patient and found that they have :improved   Social Determinants of Health:  Lives alone   Dispostion:  After consideration of the diagnostic results and the patients response to treatment, I feel that the patent would benefit from admission.    CRITICAL CARE Performed by: Isla Pence   Total critical care time: 30 minutes  Critical care time was exclusive of separately billable procedures and treating other patients.  Critical care was necessary to treat or prevent imminent or life-threatening deterioration.  Critical care was time spent personally by me on the following activities: development of treatment plan with patient and/or surrogate as well as nursing, discussions with consultants, evaluation of patient's response to treatment, examination of patient, obtaining history from patient or surrogate, ordering and performing treatments and interventions, ordering and review of laboratory studies, ordering and review of radiographic studies, pulse oximetry and re-evaluation of patient's condition.         Final Clinical Impression(s) / ED Diagnoses Final diagnoses:  Fall, initial encounter  Glioma [facility])  [facility])    Rx / DC Orders ED Discharge Orders     None         Isla Pence, MD 07/11/22 [phone]

## 2022-07-11 NOTE — Hospital Course (Signed)
Lauren Hill is a 88 y.o. 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

## 2022-07-11 NOTE — ED Notes (Signed)
ED TO INPATIENT HANDOFF REPORT  ED Nurse Name and Phone #: Alroy Bailiff Name/Age/Gender Lauren Hill 88 y.o. female Room/Bed: WA09/WA09  Code Status   Code Status: Prior  Home/SNF/Other Home Patient oriented to: self, place, time, and situation Is this baseline? 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 Hx glioblastoma. Family found prone on floor today. Pt reports sliding from bed to floor yesterday, but does not know how she got facedown. Small hematoma to left cheek. Arrives in c-collar. No complaints. Denies SHOB. Did not take am atenolol. 12L 1st degree block.  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 [facility]) [[id number]]  Admitting Physician: Lenore Cordia [[id number]]  Attending Physician: Lenore Cordia [[id number]]  Certification:: I certify this patient will need inpatient services for at least 2 midnights  Estimated Length of Stay: 3          B Medical/Surgery History Past Medical History:  Diagnosis Date   Arthritis    GERD (gastroesophageal reflux disease)    indigestion   History of blood transfusion    as an infant   History of hiatal hernia    History of pneumonia    Hyperlipidemia    Hypertension    Renal cyst    Past Surgical History:  Procedure Laterality Date   APPLICATION OF CRANIAL NAVIGATION  Right 03/29/2022   Procedure: APPLICATION OF CRANIAL NAVIGATION;  Surgeon: Newman Pies, MD;  Location: [facility];  Service: Neurosurgery;  Laterality: Right;   COLONOSCOPY     CRANIOTOMY Right 03/29/2022   Procedure: CRANIOTOMY TUMOR EXCISION;  Surgeon: Newman Pies, MD;  Location: [facility];  Service: Neurosurgery;  Laterality: Right;   DILATION AND CURETTAGE OF UTERUS     x2   EYE SURGERY     cataracts   HIP SURGERY  5/05   right hip replacement   HIP SURGERY  5/11   left hip replacement   ORIF HUMERUS FRACTURE Left 11/11/2017   Procedure: OPEN REDUCTION INTERNAL FIXATION (ORIF) LEFT DISTAL HUMERUS FRACTURE;  Surgeon: Shona Needles, MD;  Location: [facility];  Service: Orthopedics;  Laterality: Left;   RHINOPLASTY     TONSILLECTOMY     TOTAL SHOULDER ARTHROPLASTY Left 04/01/2016   Procedure: LEFT TOTAL SHOULDER ARTHROPLASTY;  Surgeon: Justice Britain, MD;  Location: [facility];  Service: Orthopedics;  Laterality: Left;     A IV Location/Drains/Wounds Patient Lines/Drains/Airways Status     Active Line/Drains/Airways     Name Placement date Placement time Site Days   Peripheral IV 07/11/22 20 G Left Antecubital 07/11/22  1432  Antecubital  less than 1   External Urinary Catheter 07/11/22  1527  --  less than 1   Wound / Incision (Open or Dehisced) 11/11/17 Face Upper 11/11/17  1300  Face  1703  Intake/Output Last 24 hours  Intake/Output Summary (Last 24 hours) at 07/11/2022 1855 Last data filed at 07/11/2022 1625 Gross per 24 hour  Intake 999 ml  Output --  Net 999 ml    Labs/Imaging Results for orders placed or performed during the hospital encounter of 07/11/22 (from the past 48 hour(s))  CBG monitoring, ED     Status: Abnormal   Collection Time: 07/11/22  1:31 PM  Result Value Ref Range   Glucose-Capillary 137 (H) 70 - 99 mg/dL    Comment: Glucose reference range applies only to samples taken after fasting for at least 8 hours.  CBC WITH DIFFERENTIAL     Status:  Abnormal   Collection Time: 07/11/22  2:32 PM  Result Value Ref Range   WBC 15.5 (H) 4.0 - 10.5 K/uL   RBC 4.36 3.87 - 5.11 MIL/uL   Hemoglobin 13.8 12.0 - 15.0 g/dL   HCT 41.1 36.0 - 46.0 %   MCV 94.3 80.0 - 100.0 fL   MCH 31.7 26.0 - 34.0 pg   MCHC 33.6 30.0 - 36.0 g/dL   RDW 13.2 11.5 - 15.5 %   Platelets 250 150 - 400 K/uL   nRBC 0.0 0.0 - 0.2 %   Neutrophils Relative % 90 %   Neutro Abs 13.8 (H) 1.7 - 7.7 K/uL   Lymphocytes Relative 5 %   Lymphs Abs 0.8 0.7 - 4.0 K/uL   Monocytes Relative 5 %   Monocytes Absolute 0.8 0.1 - 1.0 K/uL   Eosinophils Relative 0 %   Eosinophils Absolute 0.0 0.0 - 0.5 K/uL   Basophils Relative 0 %   Basophils Absolute 0.0 0.0 - 0.1 K/uL   Immature Granulocytes 0 %   Abs Immature Granulocytes 0.05 0.00 - 0.07 K/uL    Comment: Performed at [facility], [place] [address] [place], [place] [address]  Comprehensive metabolic panel     Status: Abnormal   Collection Time: 07/11/22  2:32 PM  Result Value Ref Range   Sodium 137 135 - 145 mmol/L   Potassium 3.8 3.5 - 5.1 mmol/L   Chloride 103 98 - 111 mmol/L   CO2 24 22 - 32 mmol/L   Glucose, Bld 133 (H) 70 - 99 mg/dL    Comment: Glucose reference range applies only to samples taken after fasting for at least 8 hours.   BUN 25 (H) 8 - 23 mg/dL   Creatinine, Ser 0.84 0.44 - 1.00 mg/dL   Calcium 9.3 8.9 - 10.3 mg/dL   Total Protein 7.6 6.5 - 8.1 g/dL   Albumin 4.0 3.5 - 5.0 g/dL   AST 40 15 - 41 U/L   ALT 22 0 - 44 U/L   Alkaline Phosphatase 58 38 - 126 U/L   Total Bilirubin 0.9 0.3 - 1.2 mg/dL   GFR, Estimated >60 >60 mL/min    Comment: (NOTE) Calculated using the CKD-EPI Creatinine Equation (2021)    Anion gap 10 5 - 15    Comment: Performed at [facility], [place] [address], [place], [place] [address]  CK     Status: Abnormal   Collection Time: 07/11/22  2:32 PM  Result Value Ref Range   Total CK 734 (H) 38 - 234 U/L    Comment: Performed at [facility], [place] [address], [place], [place] [address]  Lactic acid, plasma     Status: None   Collection Time: 07/11/22  2:32 PM  Result Value Ref Range  Lactic Acid, Venous 1.5 0.5 - 1.9 mmol/L    Comment: Performed at [facility], [place] [address], [place], [place] [address]  Urinalysis, Routine w reflex microscopic -Urine, Clean Catch     Status: Abnormal   Collection Time: 07/11/22  5:18 PM  Result Value Ref Range   Color, Urine YELLOW YELLOW   APPearance CLEAR CLEAR   Specific Gravity, Urine 1.015 1.005 - 1.030   pH 6.0 5.0 - 8.0   Glucose, UA NEGATIVE NEGATIVE mg/dL   Hgb urine dipstick NEGATIVE NEGATIVE   Bilirubin Urine NEGATIVE NEGATIVE   Ketones, ur 20 (A) NEGATIVE mg/dL   Protein, ur NEGATIVE NEGATIVE mg/dL   Nitrite NEGATIVE NEGATIVE   Leukocytes,Ua NEGATIVE NEGATIVE   RBC / HPF 11-20 0 - 5 RBC/hpf   WBC, UA 0-5 0 - 5 WBC/hpf   Bacteria, UA NONE SEEN NONE SEEN   Squamous Epithelial / HPF 0-5 0 - 5 /HPF   Mucus PRESENT    Hyaline Casts, UA PRESENT     Comment: Performed at [facility], [place] [address], [place], [place] [address]   CT HEAD WO CONTRAST  Result Date: 07/11/2022 CLINICAL DATA:  Fall, history of glioblastoma EXAM: CT HEAD WITHOUT CONTRAST CT CERVICAL SPINE WITHOUT CONTRAST TECHNIQUE: Multidetector CT imaging of the head and cervical spine was performed following the standard protocol without intravenous contrast. Multiplanar CT image reconstructions of the cervical spine were also generated. RADIATION DOSE REDUCTION: This exam was performed according to the departmental dose-optimization program which includes automated exposure control, adjustment of the mA and/or kV according to patient size and/or use of iterative reconstruction technique. COMPARISON:  MR brain, 07/08/2022 FINDINGS: CT HEAD FINDINGS Brain: Unchanged appearance of the brain, compared to recent prior MR, with severe, diffuse edema involving the mid  and posterior right cerebral hemisphere, with mass effect on the right lateral ventricle and right-to-left midline shift of approximately 0.6 cm, as well as underlying periventricular and deep white matter hypodensity. No hemorrhage or other new finding. Vascular: No hyperdense vessel or unexpected calcification. Skull: Status post right parietal craniotomy. Negative for fracture or focal lesion. Sinuses/Orbits: No acute finding. Other: None. CT CERVICAL SPINE FINDINGS Alignment: Normal. Skull base and vertebrae: No acute fracture. No primary bone lesion or focal pathologic process. Soft tissues and spinal canal: No prevertebral fluid or swelling. No visible canal hematoma. Disc levels: Severe multilevel disc space height loss and osteophytosis from C4-C7. Upper chest: Negative. Other: None. IMPRESSION: 1. Unchanged appearance of the brain, compared to recent prior MR, with severe, diffuse edema involving the mid and posterior right cerebral hemisphere, with mass effect on the right lateral ventricle and right-to-left midline shift of approximately 0.6 cm, as well as underlying periventricular and deep white matter hypodensity. Findings are in keeping with known glioblastoma. 2. No hemorrhage or other new finding. 3. No fracture or static subluxation of the cervical spine. 4. Severe multilevel cervical disc degenerative disease. Electronically Signed   By: Delanna Ahmadi M.D.   On: 07/11/2022 14:22   CT CERVICAL SPINE WO CONTRAST  Result Date: 07/11/2022 CLINICAL DATA:  Fall, history of glioblastoma EXAM: CT HEAD WITHOUT CONTRAST CT CERVICAL SPINE WITHOUT CONTRAST TECHNIQUE: Multidetector CT imaging of the head and cervical spine was performed following the standard protocol without intravenous contrast. Multiplanar CT image reconstructions of the cervical spine were also generated. RADIATION DOSE REDUCTION: This exam was performed according to the departmental dose-optimization program which includes automated  exposure control, adjustment of the  mA and/or kV according to patient size and/or use of iterative reconstruction technique. COMPARISON:  MR brain, 07/08/2022 FINDINGS: CT HEAD FINDINGS Brain: Unchanged appearance of the brain, compared to recent prior MR, with severe, diffuse edema involving the mid and posterior right cerebral hemisphere, with mass effect on the right lateral ventricle and right-to-left midline shift of approximately 0.6 cm, as well as underlying periventricular and deep white matter hypodensity. No hemorrhage or other new finding. Vascular: No hyperdense vessel or unexpected calcification. Skull: Status post right parietal craniotomy. Negative for fracture or focal lesion. Sinuses/Orbits: No acute finding. Other: None. CT CERVICAL SPINE FINDINGS Alignment: Normal. Skull base and vertebrae: No acute fracture. No primary bone lesion or focal pathologic process. Soft tissues and spinal canal: No prevertebral fluid or swelling. No visible canal hematoma. Disc levels: Severe multilevel disc space height loss and osteophytosis from C4-C7. Upper chest: Negative. Other: None. IMPRESSION: 1. Unchanged appearance of the brain, compared to recent prior MR, with severe, diffuse edema involving the mid and posterior right cerebral hemisphere, with mass effect on the right lateral ventricle and right-to-left midline shift of approximately 0.6 cm, as well as underlying periventricular and deep white matter hypodensity. Findings are in keeping with known glioblastoma. 2. No hemorrhage or other new finding. 3. No fracture or static subluxation of the cervical spine. 4. Severe multilevel cervical disc degenerative disease. Electronically Signed   By: Delanna Ahmadi M.D.   On: 07/11/2022 14:22   DG Pelvis 1-2 Views  Result Date: 07/11/2022 CLINICAL DATA:  Status post fall. EXAM: PELVIS - 1-2 VIEW COMPARISON:  10/27/2009 FINDINGS: The patient is status post bilateral hip arthroplasty. The hardware components are  in anatomic alignment without signs of fracture or dislocation. No signs of pelvic fracture or diastasis. IMPRESSION: Status post bilateral hip arthroplasty. No acute findings. Electronically Signed   By: Kerby Moors M.D.   On: 07/11/2022 14:02   DG Chest Portable 1 View  Result Date: 07/11/2022 CLINICAL DATA:  Status post fall. EXAM: PORTABLE CHEST 1 VIEW COMPARISON:  03/23/2022 FINDINGS: Normal heart size. No pleural effusion or edema. Decreased lung volumes. No airspace opacities. Severe degenerative changes noted in the right glenohumeral joint. Prior left shoulder arthroplasty. IMPRESSION: Low lung volumes. No acute findings. Electronically Signed   By: Kerby Moors M.D.   On: 07/11/2022 14:00    Pending Labs Unresulted Labs (From admission, onward)    None       Vitals/Pain Today's Vitals   07/11/22 1630 07/11/22 1645 07/11/22 1700 07/11/22 1715  BP: (!) 149/72  (!) 154/69   Pulse: (!) 101 (!) 101 (!) 103 98  Resp: '19 18 18 '$ (!) 22  Temp:   98.1 F (36.7 C)   TempSrc:      SpO2: 95% 94% 95% 95%  Weight:      Height:        Isolation Precautions No active isolations  Medications Medications  sodium chloride 0.9 % bolus 1,000 mL (0 mLs Intravenous Stopped 07/11/22 1625)    And  0.9 %  sodium chloride infusion (0 mLs Intravenous Hold 07/11/22 1625)  ibuprofen (ADVIL) tablet 600 mg (has no administration in time range)  levETIRAcetam (KEPPRA) tablet 500 mg (has no administration in time range)  atenolol (TENORMIN) tablet 50 mg (has no administration in time range)  dexamethasone (DECADRON) injection 4 mg (has no administration in time range)    Mobility walks with device     Focused Assessments    R Recommendations: See Admitting  Provider Note  Report given to:   Additional Notes:

## 2022-07-11 NOTE — ED Triage Notes (Signed)
Pt BIBA from home for possible fall. Hx glioblastoma. Family found prone on floor today. Pt reports sliding from bed to floor yesterday, but does not know how she got facedown. Small hematoma to left cheek. Arrives in c-collar. No complaints. Denies SHOB. Did not take am atenolol. 12L 1st degree block.  137/80 HR 108 09% RA CBG 178

## 2022-07-12 ENCOUNTER — Inpatient Hospital Stay: Payer: PPO | Admitting: Internal Medicine

## 2022-07-12 ENCOUNTER — Inpatient Hospital Stay: Payer: PPO | Admitting: Nurse Practitioner

## 2022-07-12 ENCOUNTER — Encounter: Payer: Self-pay | Admitting: *Deleted

## 2022-07-12 ENCOUNTER — Inpatient Hospital Stay (HOSPITAL_COMMUNITY)
Admit: 2022-07-12 | Discharge: 2022-07-12 | Disposition: A | Discharge status: Home / Self Care | Payer: PPO | Attending: Internal Medicine | Admitting: Internal Medicine

## 2022-07-12 ENCOUNTER — Inpatient Hospital Stay: Payer: PPO | Attending: Internal Medicine

## 2022-07-12 DIAGNOSIS — K219 Gastro-esophageal reflux disease without esophagitis: Secondary | ICD-10-CM | POA: Insufficient documentation

## 2022-07-12 DIAGNOSIS — I10 Essential (primary) hypertension: Secondary | ICD-10-CM | POA: Insufficient documentation

## 2022-07-12 DIAGNOSIS — R5383 Other fatigue: Secondary | ICD-10-CM | POA: Insufficient documentation

## 2022-07-12 DIAGNOSIS — Z79899 Other long term (current) drug therapy: Secondary | ICD-10-CM | POA: Insufficient documentation

## 2022-07-12 DIAGNOSIS — R569 Unspecified convulsions: Secondary | ICD-10-CM

## 2022-07-12 DIAGNOSIS — C719 Malignant neoplasm of brain, unspecified: Secondary | ICD-10-CM | POA: Diagnosis not present

## 2022-07-12 DIAGNOSIS — Z923 Personal history of irradiation: Secondary | ICD-10-CM | POA: Insufficient documentation

## 2022-07-12 DIAGNOSIS — Z7952 Long term (current) use of systemic steroids: Secondary | ICD-10-CM | POA: Insufficient documentation

## 2022-07-12 DIAGNOSIS — E785 Hyperlipidemia, unspecified: Secondary | ICD-10-CM | POA: Insufficient documentation

## 2022-07-12 LAB — BASIC METABOLIC PANEL
Anion gap: 10 (ref 5–15)
BUN: 19 mg/dL (ref 8–23)
CO2: 21 mmol/L — ABNORMAL LOW (ref 22–32)
Calcium: 9.2 mg/dL (ref 8.9–10.3)
Chloride: 107 mmol/L (ref 98–111)
Creatinine, Ser: 0.68 mg/dL (ref 0.44–1.00)
GFR, Estimated: >60 mL/min (ref >60)
Glucose, Bld: 141 mg/dL — ABNORMAL HIGH (ref 70–99)
Potassium: 3.7 mmol/L (ref 3.5–5.1)
Sodium: 138 mmol/L (ref 135–145)

## 2022-07-12 LAB — CBC
HCT: 39.4 % (ref 36.0–46.0)
Hemoglobin: 12.6 g/dL (ref 12.0–15.0)
MCH: 30.7 pg (ref 26.0–34.0)
MCHC: 32 g/dL (ref 30.0–36.0)
MCV: 95.9 fL (ref 80.0–100.0)
Platelets: 232 10*3/uL (ref 150–400)
RBC: 4.11 MIL/uL (ref 3.87–5.11)
RDW: 13.4 % (ref 11.5–15.5)
WBC: 11.1 10*3/uL — ABNORMAL HIGH (ref 4.0–10.5)
nRBC: 0 % (ref 0.0–0.2)

## 2022-07-12 LAB — CK: Total CK: 368 U/L — ABNORMAL HIGH (ref 38–234)

## 2022-07-12 MED ORDER — METOPROLOL TARTRATE 5 MG/5ML IV SOLN: 5.0000 mg | INTRAVENOUS | Status: DC | PRN

## 2022-07-12 MED ORDER — SENNOSIDES-DOCUSATE SODIUM 8.6-50 MG PO TABS: 1.0000 | ORAL_TABLET | Freq: Every evening | ORAL | Status: DC | PRN

## 2022-07-12 MED ORDER — HYDRALAZINE HCL 20 MG/ML IJ SOLN: 10.0000 mg | INTRAMUSCULAR | Status: DC | PRN

## 2022-07-12 MED ORDER — IPRATROPIUM-ALBUTEROL 0.5-2.5 (3) MG/3ML IN SOLN: 3.0000 mL | RESPIRATORY_TRACT | Status: DC | PRN

## 2022-07-12 MED ORDER — POTASSIUM CHLORIDE CRYS ER 20 MEQ PO TBCR
40.0000 meq | EXTENDED_RELEASE_TABLET | Freq: Once | ORAL | Status: AC
  Administered 2022-07-12: 40 meq via ORAL
  Filled 2022-07-12: qty 2

## 2022-07-12 MED ORDER — GUAIFENESIN 100 MG/5ML PO LIQD: 5.0000 mL | ORAL | Status: DC | PRN

## 2022-07-12 MED ORDER — TRAZODONE HCL 50 MG PO TABS: 50.0000 mg | ORAL_TABLET | Freq: Every evening | ORAL | Status: DC | PRN

## 2022-07-12 NOTE — Progress Notes (Signed)
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  She completed 3 weeks of radiation on June 15, 2022, follow-up MRI on July 08, 2022 showed progressive enhancement and swelling of right temporal mass with new ependymal enhancement at temporal horn with midline shift 5 mm.  Of note patient was admitted to [facility] about 2 months ago for culture-negative presumed bacterial meningitis treated with prolonged antibiotics.  During this admission CT head showed severe diffuse cerebral edema with midline shift.  CT cervical spine was negative.  EDP discussed case with Dr. Mickeal Skinner who recommended IV Decadron and admitting the patient.  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  Now showing progression of her mass with midline shift on MRI from July 08 2022.  This is confirmed on the CT scan here with severe diffuse cerebral edema.  This is likely contributing to her falls but could have been seizure? -Neuro-oncology, Dr. Mickeal Skinner is recommending IV Decadron every 6 hours. -Continue home Keppra 500 mg twice daily.  Order EEG   Fall at home with elevated CK Rhabdomyolysis Leukocytosis, reactive Secondary to fall at home, renal function unremarkable.  Initial CK level 734.  Improving with IV fluids.   Hypertension: Continue  amlodipine and atenolol.  IV as needed   Hyperlipidemia: Continue atorvastatin.  Patient is a full code with progression of her mass.  Neuro-oncology will need to weigh in on her prognosis.  Thereafter in my opinion she would benefit from palliative care  DVT prophylaxis: Lovenox Code Status: Full code Family Communication: Daughter and sister at bedside  Status is: Inpatient Remains inpatient appropriate because: Awaiting evaluation by neuro-oncology.     Subjective: Seen and examined at bedside, no complaints.   Examination:  General exam: Appears calm and comfortable  Respiratory system: Clear to auscultation. Respiratory effort normal. Cardiovascular system: S1 & S2 heard, RRR. No JVD, murmurs, rubs, gallops or clicks. No pedal edema. Gastrointestinal system: Abdomen is nondistended, soft and nontender. No organomegaly or masses felt. Normal bowel sounds heard. Central nervous system: Alert and oriented. No focal neurological deficits. Extremities: Symmetric 5 x 5 power. Skin: No rashes, lesions or ulcers Psychiatry: Judgement and insight appear normal. Mood & affect appropriate.     Objective: Vitals:   07/11/22 1845 07/11/22 2000 07/11/22 2358 07/12/22 0426  BP:  (!) 144/65 (!) 154/66 (!) 142/67  Pulse: (!) 104 100 (!) 107 87  Resp:  '20 20 20  '$ Temp:  98.3 F (36.8 C) 98.3 F (36.8 C) 97.8 F (36.6 C)  TempSrc:  Oral Oral Oral  SpO2: 94% 94% 92% 94%  Weight:    75.6 kg  Height:        Intake/Output Summary (Last 24 hours) at 07/12/2022 0748 Last data filed at 07/12/2022 0600 Gross per 24 hour  Intake 1927.94 ml  Output 250 ml  Net 1677.94 ml   [employer]  07/11/22 1528 07/12/22 0426  Weight: 73 kg 75.6 kg     Data Reviewed:   CBC: Recent Labs  Lab 07/11/22 1432 07/12/22 0450  WBC 15.5* 11.1*  NEUTROABS 13.8*  --   HGB 13.8 12.6  HCT 41.1 39.4  MCV 94.3 95.9  PLT 250 638   Basic Metabolic Panel: Recent Labs  Lab 07/11/22 1432  07/12/22 0450  NA 137 138  K 3.8 3.7  CL 103 107  CO2 24 21*  GLUCOSE 133* 141*  BUN 25* 19  CREATININE 0.84 0.68  CALCIUM 9.3 9.2   GFR: Estimated Creatinine Clearance: 48.9 mL/min (by C-G formula based on SCr of 0.68 mg/dL). Liver Function Tests: Recent Labs  Lab 07/11/22 1432  AST 40  ALT 22  ALKPHOS 58  BILITOT 0.9  PROT 7.6  ALBUMIN 4.0   No results for input(s): "LIPASE", "AMYLASE" in the last 168 hours. No results for input(s): "AMMONIA" in the last 168 hours. Coagulation Profile: No results for input(s): "INR", "PROTIME" in the last 168 hours. Cardiac Enzymes: Recent Labs  Lab 07/11/22 1432 07/12/22 0450  CKTOTAL 734* 368*   BNP (last 3 results) No results for input(s): "PROBNP" in the last 8760 hours. HbA1C: No results for input(s): "HGBA1C" in the last 72 hours. CBG: Recent Labs  Lab 07/11/22 1331  GLUCAP 137*   Lipid Profile: No results for input(s): "CHOL", "HDL", "LDLCALC", "TRIG", "CHOLHDL", "LDLDIRECT" in the last 72 hours. Thyroid Function Tests: No results for input(s): "TSH", "T4TOTAL", "FREET4", "T3FREE", "THYROIDAB" in the last 72 hours. Anemia Panel: No results for input(s): "VITAMINB12", "FOLATE", "FERRITIN", "TIBC", "IRON", "RETICCTPCT" in the last 72 hours. Sepsis Labs: Recent Labs  Lab 07/11/22 1432  LATICACIDVEN 1.5    No results found for this or any previous visit (from the past 240 hour(s)).       Radiology Studies: CT HEAD WO CONTRAST  Result Date: 07/11/2022 CLINICAL DATA:  Fall, history of glioblastoma EXAM: CT HEAD WITHOUT CONTRAST CT CERVICAL SPINE WITHOUT CONTRAST TECHNIQUE: Multidetector CT imaging of the head and cervical spine was performed following the standard protocol without intravenous contrast. Multiplanar CT image reconstructions of the cervical spine were also generated. RADIATION DOSE REDUCTION: This exam was performed according to the departmental dose-optimization program which includes automated  exposure control, adjustment of the mA and/or kV according to patient size and/or use of iterative reconstruction technique. COMPARISON:  MR brain, 07/08/2022 FINDINGS: CT HEAD FINDINGS Brain: Unchanged appearance of the brain, compared to recent prior MR, with severe, diffuse edema involving the mid and posterior right cerebral hemisphere, with mass effect on the right lateral ventricle and right-to-left midline shift of approximately 0.6 cm, as well as underlying periventricular and deep white matter hypodensity. No hemorrhage or other new finding. Vascular: No hyperdense vessel or unexpected calcification. Skull: Status post right parietal craniotomy. Negative for fracture or focal lesion. Sinuses/Orbits: No acute finding. Other: None. CT CERVICAL SPINE FINDINGS Alignment: Normal. Skull base and vertebrae: No acute fracture. No primary bone lesion or focal pathologic process. Soft tissues and spinal canal: No prevertebral fluid or swelling. No visible canal hematoma. Disc levels: Severe multilevel disc space height loss and osteophytosis from C4-C7. Upper chest: Negative. Other: None. IMPRESSION: 1. Unchanged appearance of the brain, compared to recent prior MR, with severe, diffuse edema involving the mid and posterior right cerebral hemisphere, with mass effect on the right lateral ventricle and right-to-left midline shift of approximately 0.6 cm, as well as underlying periventricular and deep white matter hypodensity.  Findings are in keeping with known glioblastoma. 2. No hemorrhage or other new finding. 3. No fracture or static subluxation of the cervical spine. 4. Severe multilevel cervical disc degenerative disease. Electronically Signed   By: Delanna Ahmadi M.D.   On: 07/11/2022 14:22   CT CERVICAL SPINE WO CONTRAST  Result Date: 07/11/2022 CLINICAL DATA:  Fall, history of glioblastoma EXAM: CT HEAD WITHOUT CONTRAST CT CERVICAL SPINE WITHOUT CONTRAST TECHNIQUE: Multidetector CT imaging of the head and  cervical spine was performed following the standard protocol without intravenous contrast. Multiplanar CT image reconstructions of the cervical spine were also generated. RADIATION DOSE REDUCTION: This exam was performed according to the departmental dose-optimization program which includes automated exposure control, adjustment of the mA and/or kV according to patient size and/or use of iterative reconstruction technique. COMPARISON:  MR brain, 07/08/2022 FINDINGS: CT HEAD FINDINGS Brain: Unchanged appearance of the brain, compared to recent prior MR, with severe, diffuse edema involving the mid and posterior right cerebral hemisphere, with mass effect on the right lateral ventricle and right-to-left midline shift of approximately 0.6 cm, as well as underlying periventricular and deep white matter hypodensity. No hemorrhage or other new finding. Vascular: No hyperdense vessel or unexpected calcification. Skull: Status post right parietal craniotomy. Negative for fracture or focal lesion. Sinuses/Orbits: No acute finding. Other: None. CT CERVICAL SPINE FINDINGS Alignment: Normal. Skull base and vertebrae: No acute fracture. No primary bone lesion or focal pathologic process. Soft tissues and spinal canal: No prevertebral fluid or swelling. No visible canal hematoma. Disc levels: Severe multilevel disc space height loss and osteophytosis from C4-C7. Upper chest: Negative. Other: None. IMPRESSION: 1. Unchanged appearance of the brain, compared to recent prior MR, with severe, diffuse edema involving the mid and posterior right cerebral hemisphere, with mass effect on the right lateral ventricle and right-to-left midline shift of approximately 0.6 cm, as well as underlying periventricular and deep white matter hypodensity. Findings are in keeping with known glioblastoma. 2. No hemorrhage or other new finding. 3. No fracture or static subluxation of the cervical spine. 4. Severe multilevel cervical disc degenerative  disease. Electronically Signed   By: Delanna Ahmadi M.D.   On: 07/11/2022 14:22   DG Pelvis 1-2 Views  Result Date: 07/11/2022 CLINICAL DATA:  Status post fall. EXAM: PELVIS - 1-2 VIEW COMPARISON:  10/27/2009 FINDINGS: The patient is status post bilateral hip arthroplasty. The hardware components are in anatomic alignment without signs of fracture or dislocation. No signs of pelvic fracture or diastasis. IMPRESSION: Status post bilateral hip arthroplasty. No acute findings. Electronically Signed   By: Kerby Moors M.D.   On: 07/11/2022 14:02   DG Chest Portable 1 View  Result Date: 07/11/2022 CLINICAL DATA:  Status post fall. EXAM: PORTABLE CHEST 1 VIEW COMPARISON:  03/23/2022 FINDINGS: Normal heart size. No pleural effusion or edema. Decreased lung volumes. No airspace opacities. Severe degenerative changes noted in the right glenohumeral joint. Prior left shoulder arthroplasty. IMPRESSION: Low lung volumes. No acute findings. Electronically Signed   By: Kerby Moors M.D.   On: 07/11/2022 14:00        Scheduled Meds:  amLODipine  5 mg Oral BID   atenolol  50 mg Oral QHS   atorvastatin  10 mg Oral q1800   dexamethasone (DECADRON) injection  4 mg Intravenous Q6H   enoxaparin (LOVENOX) injection  40 mg Subcutaneous Q24H   ibuprofen  600 mg Oral Once   levETIRAcetam  500 mg Oral BID   sodium chloride flush  3  mL Intravenous Q12H   Continuous Infusions:   LOS: 1 day   Time spent= 35 mins    Kanton Kamel Arsenio Loader, MD [facility]  If 7PM-7AM, please contact night-coverage  07/12/2022, 7:48 AM

## 2022-07-12 NOTE — Consult Note (Signed)
[facility] Consult Note  Patient Care Team: Burnard Bunting, MD as PCP - General (Internal Medicine) Pickenpack-Cousar, Carlena Sax, NP as Nurse Practitioner (Nurse Practitioner)  CHIEF COMPLAINTS/PURPOSE OF CONSULTATION:  Glioblastoma Altered mental status  HISTORY OF PRESENTING ILLNESS:  Lauren Hill 88 y.o. female presented with an unwitnessed fall at home.  She was unable to get herself up, and laid on floor "for hours".  This led to family discovering her and calling EMS.  Per family, she had become more confused over a period of several days leading up to the fall.  Had been living at home alone with some visiting support.  Today, they describe return to prior baseline following the steroids.  No description of seizure activity.  MEDICAL HISTORY:  Past Medical History:  Diagnosis Date   Arthritis    GERD (gastroesophageal reflux disease)    indigestion   History of blood transfusion    as an infant   History of hiatal hernia    History of pneumonia    Hyperlipidemia    Hypertension    Renal cyst     SURGICAL HISTORY: Past Surgical History:  Procedure Laterality Date   APPLICATION OF CRANIAL NAVIGATION Right 03/29/2022   Procedure: APPLICATION OF CRANIAL NAVIGATION;  Surgeon: Newman Pies, MD;  Location: [facility];  Service: Neurosurgery;  Laterality: Right;   COLONOSCOPY     CRANIOTOMY Right 03/29/2022   Procedure: CRANIOTOMY TUMOR EXCISION;  Surgeon: Newman Pies, MD;  Location: [place];  Service: Neurosurgery;  Laterality: Right;   DILATION AND CURETTAGE OF UTERUS     x2   EYE SURGERY     cataracts   HIP SURGERY  5/05   right hip replacement   HIP SURGERY  5/11   left hip replacement   ORIF HUMERUS FRACTURE Left 11/11/2017   Procedure: OPEN REDUCTION INTERNAL FIXATION (ORIF) LEFT DISTAL HUMERUS FRACTURE;  Surgeon: Shona Needles, MD;  Location: [facility];  Service: Orthopedics;  Laterality: Left;   RHINOPLASTY     TONSILLECTOMY      TOTAL SHOULDER ARTHROPLASTY Left 04/01/2016   Procedure: LEFT TOTAL SHOULDER ARTHROPLASTY;  Surgeon: Justice Britain, MD;  Location: [facility];  Service: Orthopedics;  Laterality: Left;    SOCIAL HISTORY: Social History   Socioeconomic History   Marital status: Widowed    Spouse name: Not on file   Number of children: Not on file   Years of education: Not on file   Highest education level: Not on file  Occupational History   Not on file  Tobacco Use   Smoking status: Never   Smokeless tobacco: Never  Vaping Use   Vaping Use: Never used  Substance and Sexual Activity   Alcohol use: No   Drug use: No   Sexual activity: Not Currently  Other Topics Concern   Not on file  Social History Narrative   Not on file   Social Determinants of Health   Financial Resource Strain: Low Risk  (04/12/2022)   Overall Financial Resource Strain (CARDIA)    Difficulty of Paying Living Expenses: Not hard at all  Food Insecurity: No Food Insecurity (07/11/2022)   Hunger Vital Sign    Worried About Running Out of Food in the Last Year: Never true    [place] in the Last Year: Never true  Transportation Needs: No Transportation Needs (07/11/2022)   PRAPARE - Hydrologist (Medical): No    Lack of Transportation (Non-Medical):  No  Physical Activity: Not on file  Stress: Not on file  Social Connections: Not on file  Intimate Partner Violence: Not At Risk (07/11/2022)   Humiliation, Afraid, Rape, and Kick questionnaire    Fear of Current or Ex-Partner: No    Emotionally Abused: No    Physically Abused: No    Sexually Abused: No    FAMILY HISTORY: Family History  Problem Relation Age of Onset   Heart disease Mother     ALLERGIES:  has No Known Allergies.  MEDICATIONS:  Current Facility-Administered Medications  Medication Dose Route Frequency Provider Last Rate Last Admin   acetaminophen (TYLENOL) tablet 650 mg  650 mg Oral Q6H PRN Lenore Cordia, MD       Or    acetaminophen (TYLENOL) suppository 650 mg  650 mg Rectal Q6H PRN Lenore Cordia, MD       amLODipine (NORVASC) tablet 5 mg  5 mg Oral BID Zada Finders R, MD   5 mg at 07/12/22 0923   atenolol (TENORMIN) tablet 50 mg  50 mg Oral QHS Lenore Cordia, MD   50 mg at 07/12/22 0020   atorvastatin (LIPITOR) tablet 10 mg  10 mg Oral q1800 Lenore Cordia, MD       dexamethasone (DECADRON) injection 4 mg  4 mg Intravenous Q6H Zada Finders R, MD   4 mg at 07/12/22 0533   enoxaparin (LOVENOX) injection 40 mg  40 mg Subcutaneous Q24H Lenore Cordia, MD   40 mg at 07/11/22 2116   guaiFENesin (ROBITUSSIN) 100 MG/5ML liquid 5 mL  5 mL Oral Q4H PRN Amin, Ankit Chirag, MD       hydrALAZINE (APRESOLINE) injection 10 mg  10 mg Intravenous Q4H PRN Amin, Ankit Chirag, MD       ibuprofen (ADVIL) tablet 600 mg  600 mg Oral Once Isla Pence, MD       ipratropium-albuterol (DUONEB) 0.5-2.5 (3) MG/3ML nebulizer solution 3 mL  3 mL Nebulization Q4H PRN Amin, Jeanella Flattery, MD       levETIRAcetam (KEPPRA) tablet 500 mg  500 mg Oral BID Zada Finders R, MD   500 mg at 07/12/22 6789   metoprolol tartrate (LOPRESSOR) injection 5 mg  5 mg Intravenous Q4H PRN Amin, Ankit Chirag, MD       ondansetron (ZOFRAN) tablet 4 mg  4 mg Oral Q6H PRN Lenore Cordia, MD       Or   ondansetron (ZOFRAN) injection 4 mg  4 mg Intravenous Q6H PRN Patel, Cleaster Corin, MD       senna-docusate (Senokot-S) tablet 1 tablet  1 tablet Oral QHS PRN Amin, Ankit Chirag, MD       sodium chloride flush (NS) 0.9 % injection 3 mL  3 mL Intravenous Q12H Zada Finders R, MD   3 mL at 07/12/22 3810   traZODone (DESYREL) tablet 50 mg  50 mg Oral QHS PRN Amin, Ankit Chirag, MD        REVIEW OF SYSTEMS:   Constitutional: Denies fevers, chills or abnormal weight loss Eyes: Denies blurriness of vision Ears, nose, mouth, throat, and face: Denies mucositis or sore throat Respiratory: Denies cough, dyspnea or wheezes Cardiovascular: Denies palpitation, chest  discomfort or lower extremity swelling Gastrointestinal:  Denies nausea, constipation, diarrhea GU: Denies dysuria or incontinence Skin: Denies abnormal skin rashes Neurological: Per HPI Musculoskeletal: Denies joint pain, back or neck discomfort. No decrease in ROM Behavioral/Psych: Denies anxiety, disturbance in thought content, and mood instability  PHYSICAL EXAMINATION: Vitals:   07/12/22 0426 07/12/22 0859  BP: (!) 142/67 135/67  Pulse: 87 80  Resp: 20 17  Temp: 97.8 F (36.6 C) 97.8 F (36.6 C)  SpO2: 94% 92%   KPS: 70. General: Alert, cooperative, pleasant, in no acute distress Head: Normal EENT: No conjunctival injection or scleral icterus. Oral mucosa moist Lungs: Resp effort normal Cardiac: Regular rate and rhythm Abdomen: Soft, non-distended abdomen Skin: No rashes cyanosis or petechiae. Extremities: No clubbing or edema  NEUROLOGIC EXAM: Mental Status: Awake, alert, attentive to examiner. Oriented to self and environment. Language is fluent with intact comprehension.  Some left environmental neglect. Cranial Nerves: Visual acuity is grossly normal. Visual fields are full. Extra-ocular movements intact. No ptosis. Face is symmetric, tongue midline. Motor: Tone and bulk are normal. Power is full in both arms and legs. Reflexes are symmetric, no pathologic reflexes present. Intact finger to nose bilaterally Sensory: Intact to light touch and temperature Gait: Deferred   LABORATORY DATA:  I have reviewed the data as listed Lab Results  Component Value Date   WBC 11.1 (H) 07/12/2022   HGB 12.6 07/12/2022   HCT 39.4 07/12/2022   MCV 95.9 07/12/2022   PLT 232 07/12/2022   Recent Labs    03/24/22 0517 03/29/22 1655 07/11/22 1432 07/12/22 0450  NA 139 137 137 138  K 3.6 3.1* 3.8 3.7  CL 107  --  103 107  CO2 23  --  24 21*  GLUCOSE 101*  --  133* 141*  BUN 23  --  25* 19  CREATININE 0.90  --  0.84 0.68  CALCIUM 9.6  --  9.3 9.2  GFRNONAA >60  --  >60  >60  PROT  --   --  7.6  --   ALBUMIN  --   --  4.0  --   AST  --   --  40  --   ALT  --   --  22  --   ALKPHOS  --   --  58  --   BILITOT  --   --  0.9  --     RADIOGRAPHIC STUDIES: I have personally reviewed the radiological images as listed and agreed with the findings in the report. CT HEAD WO CONTRAST  Result Date: 07/11/2022 CLINICAL DATA:  Fall, history of glioblastoma EXAM: CT HEAD WITHOUT CONTRAST CT CERVICAL SPINE WITHOUT CONTRAST TECHNIQUE: Multidetector CT imaging of the head and cervical spine was performed following the standard protocol without intravenous contrast. Multiplanar CT image reconstructions of the cervical spine were also generated. RADIATION DOSE REDUCTION: This exam was performed according to the departmental dose-optimization program which includes automated exposure control, adjustment of the mA and/or kV according to patient size and/or use of iterative reconstruction technique. COMPARISON:  MR brain, 07/08/2022 FINDINGS: CT HEAD FINDINGS Brain: Unchanged appearance of the brain, compared to recent prior MR, with severe, diffuse edema involving the mid and posterior right cerebral hemisphere, with mass effect on the right lateral ventricle and right-to-left midline shift of approximately 0.6 cm, as well as underlying periventricular and deep white matter hypodensity. No hemorrhage or other new finding. Vascular: No hyperdense vessel or unexpected calcification. Skull: Status post right parietal craniotomy. Negative for fracture or focal lesion. Sinuses/Orbits: No acute finding. Other: None. CT CERVICAL SPINE FINDINGS Alignment: Normal. Skull base and vertebrae: No acute fracture. No primary bone lesion or focal pathologic process. Soft tissues and spinal canal: No prevertebral fluid or swelling. No visible canal  hematoma. Disc levels: Severe multilevel disc space height loss and osteophytosis from C4-C7. Upper chest: Negative. Other: None. IMPRESSION: 1. Unchanged  appearance of the brain, compared to recent prior MR, with severe, diffuse edema involving the mid and posterior right cerebral hemisphere, with mass effect on the right lateral ventricle and right-to-left midline shift of approximately 0.6 cm, as well as underlying periventricular and deep white matter hypodensity. Findings are in keeping with known glioblastoma. 2. No hemorrhage or other new finding. 3. No fracture or static subluxation of the cervical spine. 4. Severe multilevel cervical disc degenerative disease. Electronically Signed   By: Delanna Ahmadi M.D.   On: 07/11/2022 14:22   CT CERVICAL SPINE WO CONTRAST  Result Date: 07/11/2022 CLINICAL DATA:  Fall, history of glioblastoma EXAM: CT HEAD WITHOUT CONTRAST CT CERVICAL SPINE WITHOUT CONTRAST TECHNIQUE: Multidetector CT imaging of the head and cervical spine was performed following the standard protocol without intravenous contrast. Multiplanar CT image reconstructions of the cervical spine were also generated. RADIATION DOSE REDUCTION: This exam was performed according to the departmental dose-optimization program which includes automated exposure control, adjustment of the mA and/or kV according to patient size and/or use of iterative reconstruction technique. COMPARISON:  MR brain, 07/08/2022 FINDINGS: CT HEAD FINDINGS Brain: Unchanged appearance of the brain, compared to recent prior MR, with severe, diffuse edema involving the mid and posterior right cerebral hemisphere, with mass effect on the right lateral ventricle and right-to-left midline shift of approximately 0.6 cm, as well as underlying periventricular and deep white matter hypodensity. No hemorrhage or other new finding. Vascular: No hyperdense vessel or unexpected calcification. Skull: Status post right parietal craniotomy. Negative for fracture or focal lesion. Sinuses/Orbits: No acute finding. Other: None. CT CERVICAL SPINE FINDINGS Alignment: Normal. Skull base and vertebrae: No acute  fracture. No primary bone lesion or focal pathologic process. Soft tissues and spinal canal: No prevertebral fluid or swelling. No visible canal hematoma. Disc levels: Severe multilevel disc space height loss and osteophytosis from C4-C7. Upper chest: Negative. Other: None. IMPRESSION: 1. Unchanged appearance of the brain, compared to recent prior MR, with severe, diffuse edema involving the mid and posterior right cerebral hemisphere, with mass effect on the right lateral ventricle and right-to-left midline shift of approximately 0.6 cm, as well as underlying periventricular and deep white matter hypodensity. Findings are in keeping with known glioblastoma. 2. No hemorrhage or other new finding. 3. No fracture or static subluxation of the cervical spine. 4. Severe multilevel cervical disc degenerative disease. Electronically Signed   By: Delanna Ahmadi M.D.   On: 07/11/2022 14:22   DG Pelvis 1-2 Views  Result Date: 07/11/2022 CLINICAL DATA:  Status post fall. EXAM: PELVIS - 1-2 VIEW COMPARISON:  10/27/2009 FINDINGS: The patient is status post bilateral hip arthroplasty. The hardware components are in anatomic alignment without signs of fracture or dislocation. No signs of pelvic fracture or diastasis. IMPRESSION: Status post bilateral hip arthroplasty. No acute findings. Electronically Signed   By: Kerby Moors M.D.   On: 07/11/2022 14:02   DG Chest Portable 1 View  Result Date: 07/11/2022 CLINICAL DATA:  Status post fall. EXAM: PORTABLE CHEST 1 VIEW COMPARISON:  03/23/2022 FINDINGS: Normal heart size. No pleural effusion or edema. Decreased lung volumes. No airspace opacities. Severe degenerative changes noted in the right glenohumeral joint. Prior left shoulder arthroplasty. IMPRESSION: Low lung volumes. No acute findings. Electronically Signed   By: Kerby Moors M.D.   On: 07/11/2022 14:00   MR BRAIN W WO CONTRAST  Result Date: 07/09/2022 CLINICAL DATA:  High-grade glioma with a resection, follow-up.  EXAM: MRI HEAD WITHOUT AND WITH CONTRAST TECHNIQUE: Multiplanar, multiecho pulse sequences of the brain and surrounding structures were obtained without and with intravenous contrast. CONTRAST:  78m GADAVIST GADOBUTROL 1 MMOL/ML IV SOLN COMPARISON:  03/30/2022 FINDINGS: Brain: Extensive enhancement with necrotic features in the right posterior temporal resection area with stellate appearing mass measuring up to 7 cm anterior to posterior and 2.3 cm in thickness. Regional T2 hyperintensity is much progressed with extensive swelling and expansion of the right hemispheric white matter, midline shift towards the left measuring 5 mm. New or progressive ependymal enhancement at the temporal horn of the right lateral ventricle, the mass is broadly contiguous with the right lateral ventricle. No incidental infarct or significant postoperative collection. No entrapment hydrocephalus Vascular: Major flow voids and vascular enhancements are preserved Skull and upper cervical spine: Unremarkable craniotomy site Sinuses/Orbits: Negative IMPRESSION: Much progressed enhancement and T2 signal with swelling at the right temporal lobe mass with new ependymal enhancement at the temporal horn of the right lateral ventricle. Midline shift measures 5 mm. Electronically Signed   By: JJorje GuildM.D.   On: 07/09/2022 04:43    ASSESSMENT & PLAN:  Right Temporal Glioblastoma  Lauren Hill with clinical syndrome localizing to the right temporal lobe.  Etiology is suspected inflammation, pseudo-progression as delayed effect of radiation therapy.  This is supported by her brisk response to decadron.  Tumor recurrence is also possible, but less likely.  Scan results and interpretation were discussed in detail with patient and family at bedside.  No further workup at this time.  May discharge with decadron '4mg'$  BID, we will con't to adjust from the clinic.    Because of recent falls, ongoing cognitive impairment, we do  not feel she is safe to live independently without supervision at this time.  All questions were answered. The patient knows to call the clinic with any problems, questions or concerns.  The total time spent in the encounter was 60 minutes and more than 50% was on counseling and review of test results     ZVentura Sellers MD 07/12/2022 11:46 AM

## 2022-07-12 NOTE — Evaluation (Signed)
`Occupational Therapy Evaluation Patient Details Name: Lauren Hill MRN: [id number] DOB: 1934-10-31 Today's Date: 07/12/2022   History of Present Illness Patient is a 88 year old female admitted for evaluation of fall at home and weakness without significant injury.  Per neuro-oncology note: "presents with clinical syndrome localizing to the right temporal lobe.  Etiology is suspected inflammation, pseudo-progression as delayed effect of radiation therapy.  This is supported by her brisk response to decadron.  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   Clinical Impression   Patient is a 88 year old female who was admitted for above. Patient was living at home with caregiver 3x a week in morning and evening and daughter support. Patient currently is supervision for mobility and ADL tasks with consistent cues for safety needed with patient bumping into objects on R side in hallway and in room with increased education on scanning techniques provided. Patient is motivated to transition home. Patient noted to use own compensatory strategies to do well with objective measures during session. Patient will need 24/7 caregiver support in next level of care. Patient's discharge plan remains appropriate at this time. OT will continue to follow acutely.        Recommendations for follow up therapy are one component of a multi-disciplinary discharge planning process, led by the attending physician.  Recommendations may be updated based on patient status, additional functional criteria and insurance authorization.   Follow Up Recommendations  Home health OT     Assistance Recommended at Discharge Frequent or constant Supervision/Assistance  Patient can return home with the following A little help with walking and/or transfers;Assistance with cooking/housework;Direct supervision/assist for medications  management;Assist for transportation;Help with stairs or ramp for entrance;Direct supervision/assist for financial management;A little help with bathing/dressing/bathroom    Functional Status Assessment  Patient has had a recent decline in their functional status and demonstrates the ability to make significant improvements in function in a reasonable and predictable amount of time.  Equipment Recommendations  None recommended by OT       Precautions / Restrictions Precautions Precautions: Fall Restrictions Weight Bearing Restrictions: No      Mobility Bed Mobility Overal bed mobility: Needs Assistance Bed Mobility: Sit to Supine       Sit to supine: Min guard        Transfers Overall transfer level: Needs assistance Equipment used: Rolling walker (2 wheels) Transfers: Sit to/from Stand Sit to Stand: Min guard           General transfer comment: min/guard for safety, able to recall safe hand placement      Balance Overall balance assessment: History of Falls, Needs assistance Sitting-balance support: No upper extremity supported, Feet supported Sitting balance-Leahy Scale: Fair     Standing balance support: Bilateral upper extremity supported, During functional activity, Reliant on assistive device for balance Standing balance-Leahy Scale: Fair           ADL either performed or assessed with clinical judgement   ADL Overall ADL's : At baseline         General ADL Comments: Patient needs supervision to complete ADL tasks. patient is able to complete simulated bathing and dressing tasks seated but when patient is standing needs spervision for safety. patietn was noted to bump into multiple objects on R side of hallway and in room with patient educated on lighthouse scanning techniques. patient demonstrated poor sfety with transfer from recliner to bed with  min A to finish transfer with patient attempting to sit prior to reaching BLE to bed. patient was  educated on need for completion of turn first. patient verbalized understanding.     Vision   Additional Comments: patient was able to trach digit with no issues. patient did report that she wore glasses for reading tasks at home. patient did not have glasses here. patient noted to have decreased ability to see L side peripheral compared to right side. patient made aware of descrepancies. patient noted to use TV screen at end to tell which UE was bieng used as per patient admitting of sorry i saw you move your LUE in TV screen. when completed out of view of TV screen there was significant difference on L side. patient noted to bump into objects on R side when walking and favor R side with ambulation in hallway.            Pertinent Vitals/Pain Pain Assessment Pain Assessment: No/denies pain     Hand Dominance Right   Extremity/Trunk Assessment Upper Extremity Assessment Upper Extremity Assessment: Overall WFL for tasks assessed;RUE deficits/detail RUE Deficits / Details: patient reported having pain in both shoulders with L H/o Replacement in and RUE need for replacement in the other. RUE: Shoulder pain at rest   Lower Extremity Assessment Lower Extremity Assessment: Defer to PT evaluation   Cervical / Trunk Assessment Cervical / Trunk Assessment: Kyphotic   Communication Communication Communication: No difficulties   Cognition Arousal/Alertness: Awake/alert Behavior During Therapy: WFL for tasks assessed/performed Overall Cognitive Status: Impaired/Different from baseline Area of Impairment: Safety/judgement, Problem solving                      This is patients clock draw test. Patient reported " no DR. Has made me do this test yet". Patient was asked to draw the time to say quarter to 10. Patient did not have reading glasses in room during this test. The placement of numbers with promote patients L side visual deficits with patient needing to turn her head to fill out  the other side of the clock when completing drawing. Given patients level of independence, personal compensatory strategies she implemented during session to attempt to hide deficits, and decreased safety awareness it is unlikely that patient would progress to not need someone 24/7 caregiver assistance at home at time of d/c.   Safety/Judgement: Decreased awareness of deficits, Decreased awareness of safety   Problem Solving: Slow processing, Requires verbal cues                  Home Living Family/patient expects to be discharged to:: Private residence Living Arrangements: Alone Available Help at Discharge: Family;Available PRN/intermittently   Home Access: Stairs to enter Entrance Stairs-Number of Steps: 4-5 Entrance Stairs-Rails: Can reach both;Right;Left Home Layout: One level     Bathroom Shower/Tub: Teacher, early years/pre: Standard     Home Equipment: Public relations account executive (2 wheels)   Additional Comments: Patient reported she had a caregiver who checks on patient 3x a week from 7-12 and then again 5-8 "to help get ready for bed". patient reported her sister lives "across the street". patient's daughter comes to stay with patient sunday to monday.      Prior Functioning/Environment Prior Level of Function : Independent/Modified Independent             Mobility Comments: using RW, falls prior to admission ADLs Comments: patient independent for ADLs and drove in her  community        OT Problem List: Decreased strength;Decreased activity tolerance;Impaired balance (sitting and/or standing);Decreased range of motion;Decreased safety awareness;Decreased knowledge of precautions;Pain      OT Treatment/Interventions: Self-care/ADL training;Therapeutic exercise;DME and/or AE instruction;Energy conservation;Therapeutic activities;Patient/family education;Balance training    OT Goals(Current goals can be found in the care plan section) Acute Rehab OT  Goals Patient Stated Goal: to go home OT Goal Formulation: With patient Time For Goal Achievement: 07/26/22 Potential to Achieve Goals: Fair  OT Frequency: Min 2X/week       AM-PAC OT "6 Clicks" Daily Activity     Outcome Measure Help from another person eating meals?: None Help from another person taking care of personal grooming?: None Help from another person toileting, which includes using toliet, bedpan, or urinal?: A Little Help from another person bathing (including washing, rinsing, drying)?: A Little Help from another person to put on and taking off regular upper body clothing?: None Help from another person to put on and taking off regular lower body clothing?: A Little (supervision) 6 Click Score: 21   End of Session Equipment Utilized During Treatment: Gait belt;Rolling walker (2 wheels) Nurse Communication: Mobility status;Other (comment) (patients poor safety awareness)  Activity Tolerance: Patient tolerated treatment well Patient left: in bed;with call bell/phone within reach;Other (comment) (with EEG test starting)  OT Visit Diagnosis: Unsteadiness on feet (R26.81);Other abnormalities of gait and mobility (R26.89);Muscle weakness (generalized) (M62.81)                Time: 8416-6063 OT Time Calculation (min): 23 min Charges:  OT General Charges $OT Visit: 1 Visit OT Evaluation $OT Eval Low Complexity: 1 Low OT Treatments $Self Care/Home Management : 8-22 mins  Rennie Plowman, MS Acute Rehabilitation Department Office# [phone]   Willa Rough 07/12/2022, 4:23 PM

## 2022-07-12 NOTE — Progress Notes (Signed)
EEG complete - results pending 

## 2022-07-12 NOTE — Procedures (Signed)
Patient Name: Lauren Hill  MRN: [id number]  Epilepsy Attending: Lora Havens  Referring Physician/Provider: Damita Lack, MD  Date: 07/12/2022 Duration: 23.17 mins  Patient history: 88 year old female with right temporal high-grade glioma status post craniotomy.  EEG to evaluate for seizure.  Level of alertness: Awake, asleep  AEDs during EEG study: LEV  Technical aspects: This EEG study was done with scalp electrodes positioned according to the 10-20 International system of electrode placement. Electrical activity was reviewed with band pass filter of 1-'70Hz'$ , sensitivity of 7 uV/mm, display speed of 46m/sec with a '60Hz'$  notched filter applied as appropriate. EEG data were recorded continuously and digitally stored.  Video monitoring was available and reviewed as appropriate.  Description: The posterior dominant rhythm consists of 9 Hz activity of moderate voltage (25-35 uV) seen predominantly in posterior head regions, asymmetric ( right<left) and reactive to eye opening and eye closing. Sleep was characterized by vertex waves, sleep spindles (12 to 14 Hz), maximal frontocentral region. There is 15 to 18 Hz, 2-3 uV beta activity distributed symmetrically and diffusely. EEG also showed continuous polymorphic sharply contoured 3 to 6 Hz theta-delta slowing in right temporo-parietal region consistent with breach artifact. Hyperventilation and photic stimulation were not performed.     ABNORMALITY - Breach artifact, right temporo-parietal region   IMPRESSION: This study is suggestive of cortical dysfunction arising from right temporo-parietal region likely secondary to underlying craniotomy. No seizures or definite epileptiform discharges were seen throughout the recording.  If suspicion for interictal activity remains a concern, a prolonged study can be considered.   Odis Turck OBarbra Sarks

## 2022-07-12 NOTE — TOC Progression Note (Signed)
Transition of Care [facility]) - Progression Note    Patient Details  Name: [name] MRN: [id number] Date of Birth: 1934/06/11  [facility]) CM/SW Contact  Haizlee Henton, Juliann Pulse, RN Phone Number: 07/12/2022, 3:03 PM  Clinical Narrative: Damaris Schooner to dtr [name] about PT recc;dtr now understands the additional asst. Health team advantage custodial care benefits-faxed w/confirmation for [employer]-[place] for HHPT rep [facility] aware. [name] can transport home on own. MD updated.      Expected Discharge Plan: [place] Barriers to Discharge: Continued Medical Work up  Expected Discharge Plan and Services                                               Social Determinants of Health (SDOH) Interventions SDOH Screenings   Food Insecurity: No Food Insecurity (07/11/2022)  Housing: Low Risk  (07/11/2022)  Transportation Needs: No Transportation Needs (07/11/2022)  Utilities: Not At Risk (07/11/2022)  Depression (PHQ2-9): Low Risk  (11/23/2021)  Financial Resource Strain: Low Risk  (04/12/2022)  Tobacco Use: Low Risk  (07/11/2022)    Readmission Risk Interventions     No data to display

## 2022-07-12 NOTE — Evaluation (Signed)
Physical Therapy Evaluation Patient Details Name: Lauren Hill MRN: [id number] DOB: Oct 19, 1934 Today's Date: 07/12/2022  History of Present Illness  88 year old female with right temporal glioma status postcraniotomy and resection on 03/29/2022 followed by 3 weeks of radiation, chronic leukocytosis, HTN, HLD, GERD admitted for evaluation of fall at home and weakness without significant injury.  Per neuro-oncology note: "presents with clinical syndrome localizing to the right temporal lobe.  Etiology is suspected inflammation, pseudo-progression as delayed effect of radiation therapy.  This is supported by her brisk response to decadron.  Tumor recurrence is also possible, but less likely."  Clinical Impression  Pt admitted with above diagnosis.  Pt currently with functional limitations due to the deficits listed below (see PT Problem List). Pt will benefit from skilled PT to increase their independence and safety with mobility to allow discharge to the venue listed below.  Pt able to ambulate good distance in hallway however does have hx of falls and presents with decreased awareness of deficits and safety.  Pt lives alone and recommend 24/7 supervision upon d/c for safety as well as per Dr. Renda Rolls note: "Because of recent falls, ongoing cognitive impairment, we do not feel she is safe to live independently without supervision at this time."        Recommendations for follow up therapy are one component of a multi-disciplinary discharge planning process, led by the attending physician.  Recommendations may be updated based on patient status, additional functional criteria and insurance authorization.  Follow Up Recommendations Home health PT      Assistance Recommended at Discharge Frequent or constant Supervision/Assistance  Patient can return home with the following  A little help with walking and/or transfers;Help with stairs or ramp for entrance    Equipment Recommendations None  recommended by PT  Recommendations for Other Services       Functional Status Assessment Patient has had a recent decline in their functional status and demonstrates the ability to make significant improvements in function in a reasonable and predictable amount of time.     Precautions / Restrictions Precautions Precautions: Fall      Mobility  Bed Mobility Overal bed mobility: Needs Assistance Bed Mobility: Supine to Sit     Supine to sit: Min guard, HOB elevated     General bed mobility comments: pt self assisted with use of UEs    Transfers Overall transfer level: Needs assistance Equipment used: Rolling walker (2 wheels) Transfers: Sit to/from Stand Sit to Stand: Min guard           General transfer comment: min/guard for safety, able to recall safe hand placement    Ambulation/Gait Ambulation/Gait assistance: Min guard Gait Distance (Feet): 250 Feet Assistive device: Rolling walker (2 wheels) Gait Pattern/deviations: Step-through pattern, Decreased stride length       General Gait Details: steady with RW, tolerated good distance, pt did require cues for obstacles and still ran into a few; ? peripheral vision on left (requested OT to evaluate further)  Stairs            Wheelchair Mobility    Modified Rankin (Stroke Patients Only)       Balance Overall balance assessment: History of Falls, Needs assistance         Standing balance support: Bilateral upper extremity supported, During functional activity, Reliant on assistive device for balance                   High Level Balance Comments: pt requires assist  if attempting to walk backwards, does not bring RW along and unsteady             Pertinent Vitals/Pain Pain Assessment Pain Assessment: No/denies pain    Home Living Family/patient expects to be discharged to:: Private residence Living Arrangements: Alone Available Help at Discharge: Family;Available  PRN/intermittently Type of Home: House Home Access: Stairs to enter Entrance Stairs-Rails: Can reach both;Right;Left Entrance Stairs-Number of Steps: 4-5   Home Layout: One level Home Equipment: Public relations account executive (2 wheels)      Prior Function Prior Level of Function : Independent/Modified Independent             Mobility Comments: using RW, falls prior to admission       Hand Dominance        Extremity/Trunk Assessment        Lower Extremity Assessment Lower Extremity Assessment: Generalized weakness (pt denies left sided weakness, able to toe tap bilaterally with feet on floor)    Cervical / Trunk Assessment Cervical / Trunk Assessment: Kyphotic  Communication   Communication: No difficulties  Cognition Arousal/Alertness: Awake/alert Behavior During Therapy: WFL for tasks assessed/performed Overall Cognitive Status: Impaired/Different from baseline Area of Impairment: Safety/judgement, Problem solving                         Safety/Judgement: Decreased awareness of deficits, Decreased awareness of safety   Problem Solving: Slow processing, Requires verbal cues          General Comments      Exercises     Assessment/Plan    PT Assessment Patient needs continued PT services  PT Problem List Decreased strength;Decreased mobility;Decreased balance;Decreased knowledge of use of DME;Decreased activity tolerance;Decreased safety awareness       PT Treatment Interventions Gait training;DME instruction;Therapeutic exercise;Balance training;Functional mobility training;Therapeutic activities;Patient/family education;Stair training;Neuromuscular re-education    PT Goals (Current goals can be found in the Care Plan section)  Acute Rehab PT Goals PT Goal Formulation: With patient/family Time For Goal Achievement: 07/26/22 Potential to Achieve Goals: Good    Frequency Min 3X/week     Co-evaluation               AM-PAC PT "6  Clicks" Mobility  Outcome Measure Help needed turning from your back to your side while in a flat bed without using bedrails?: A Little Help needed moving from lying on your back to sitting on the side of a flat bed without using bedrails?: A Little Help needed moving to and from a bed to a chair (including a wheelchair)?: A Little Help needed standing up from a chair using your arms (e.g., wheelchair or bedside chair)?: A Little Help needed to walk in hospital room?: A Little Help needed climbing 3-5 steps with a railing? : A Little 6 Click Score: 18    End of Session Equipment Utilized During Treatment: Gait belt Activity Tolerance: Patient tolerated treatment well Patient left: in chair;with call bell/phone within reach;with chair alarm set;with family/visitor present Nurse Communication: Mobility status PT Visit Diagnosis: Difficulty in walking, not elsewhere classified (R26.2)    Time: 2620-3559 PT Time Calculation (min) (ACUTE ONLY): 22 min   Charges:   PT Evaluation $PT Eval Low Complexity: 1 Low         Kati PT, DPT Physical Therapist Acute Rehabilitation Services Preferred contact method: Secure Chat Weekend Pager Only: [phone] Office: [place] 07/12/2022, 1:02 PM

## 2022-07-13 DIAGNOSIS — C719 Malignant neoplasm of brain, unspecified: Secondary | ICD-10-CM | POA: Diagnosis not present

## 2022-07-13 LAB — CBC
HCT: 36.8 % (ref 36.0–46.0)
Hemoglobin: 11.7 g/dL — ABNORMAL LOW (ref 12.0–15.0)
MCH: 30.3 pg (ref 26.0–34.0)
MCHC: 31.8 g/dL (ref 30.0–36.0)
MCV: 95.3 fL (ref 80.0–100.0)
Platelets: 214 10*3/uL (ref 150–400)
RBC: 3.86 MIL/uL — ABNORMAL LOW (ref 3.87–5.11)
RDW: 13.4 % (ref 11.5–15.5)
WBC: 15.8 10*3/uL — ABNORMAL HIGH (ref 4.0–10.5)
nRBC: 0 % (ref 0.0–0.2)

## 2022-07-13 LAB — BASIC METABOLIC PANEL
Anion gap: 7 (ref 5–15)
BUN: 22 mg/dL (ref 8–23)
CO2: 21 mmol/L — ABNORMAL LOW (ref 22–32)
Calcium: 9.3 mg/dL (ref 8.9–10.3)
Chloride: 110 mmol/L (ref 98–111)
Creatinine, Ser: 0.65 mg/dL (ref 0.44–1.00)
GFR, Estimated: >60 mL/min (ref >60)
Glucose, Bld: 143 mg/dL — ABNORMAL HIGH (ref 70–99)
Potassium: 4.1 mmol/L (ref 3.5–5.1)
Sodium: 138 mmol/L (ref 135–145)

## 2022-07-13 LAB — MAGNESIUM: Magnesium: 2.3 mg/dL (ref 1.7–2.4)

## 2022-07-13 MED ORDER — DEXAMETHASONE 4 MG PO TABS: 4.0000 mg | ORAL_TABLET | Freq: Two times a day (BID) | ORAL | 0 refills | Status: DC

## 2022-07-13 NOTE — TOC Transition Note (Signed)
Transition of Care [facility]) - CM/SW Discharge Note  Patient Details  Name: Lauren Hill MRN: [id number] Date of Birth: 1935/04/14  [facility]) CM/SW Contact:  Sherie Don, LCSW Phone Number: 07/13/2022, 10:34 AM  Clinical Narrative: Patient will discharge home today. RN and aide have been added to the [facility] orders. CSW confirmed with Tommi Rumps with [name] that the agency can staff the additional services. TOC signing off.    Final next level of care: Home w Home Health Services Barriers to Discharge: Barriers Resolved  Patient Goals and CMS Choice CMS Medicare.gov Compare Post Acute Care list provided to:: Patient Represenative (must comment)  Discharge Plan and Services Additional resources added to the After Visit Summary for          DME Arranged: N/A DME Agency: NA HH Arranged: RN, PT, OT, Nurse's Aide [place] Agency: [place] Date [facility] Agency Contacted: 07/13/22 Representative spoke with at [place]: [place] Determinants of Health ([place]) Interventions SDOH Screenings   Food Insecurity: No Food Insecurity (07/11/2022)  Housing: Low Risk  (07/11/2022)  Transportation Needs: No Transportation Needs (07/11/2022)  Utilities: Not At Risk (07/11/2022)  Depression (PHQ2-9): Low Risk  (11/23/2021)  Financial Resource Strain: Low Risk  (04/12/2022)  Tobacco Use: Low Risk  (07/11/2022)   Readmission Risk Interventions     No data to display

## 2022-07-13 NOTE — Plan of Care (Signed)
  Problem: Activity: Goal: Risk for activity intolerance will decrease Outcome: Progressing   Problem: Nutrition: Goal: Adequate nutrition will be maintained Outcome: Progressing   Problem: Elimination: Goal: Will not experience complications related to bowel motility Outcome: Progressing Goal: Will not experience complications related to urinary retention Outcome: Progressing   

## 2022-07-13 NOTE — Discharge Summary (Addendum)
Physician Discharge Summary  Lauren Hill XBJ:[id number] DOB: 05-09-1935 DOA: 07/11/2022  PCP: Burnard Bunting, MD  Admit date: 07/11/2022 Discharge date: 07/13/2022  Admitted From: Home Disposition: Home with home health  Recommendations for Outpatient Follow-up:  Follow up with PCP in 1-2 weeks Please obtain BMP/CBC in one week your next doctors visit.  Decadron 4 mg twice daily prescribed.  Needs to follow-up outpatient neuro-oncology, to be arranged by their service In my opinion it would be better patient transferred to assisted living or independent living facility sooner than later from her private home.   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  She completed 3 weeks of radiation on June 15, 2022, follow-up MRI on July 08, 2022 showed progressive enhancement and swelling of right temporal mass with new ependymal enhancement at temporal horn with midline shift 5 mm.   Of note patient was admitted to [facility] about 2 months ago for culture-negative presumed bacterial meningitis treated with prolonged antibiotics.   During this admission CT head showed severe diffuse cerebral edema with midline shift.  CT cervical spine was negative.  EDP discussed case with Dr. Mickeal Skinner who recommended IV Decadron and admitting the patient.  During the hospitalization patient did significantly well, EEG was unremarkable.  She was seen by neuro-oncology and cleared for discharge.  PT OT recommended home health.  Although patient wanted to go to SNF, she was doing remarkably well with ambulation therefore home health services were arranged.   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  Now showing progression of her mass with midline shift on MRI from July 08 2022.  This is confirmed on the CT scan here with severe diffuse cerebral edema.  Seizure could have possibly contributed this but her EEG here was unremarkable.  Seen by neuro-oncology who is recommending transitioning patient to Decadron 4 mg twice daily and outpatient follow-up.   Fall at home with elevated CK Rhabdomyolysis Leukocytosis, reactive and now on steroids without any evidence of infection Secondary to fall at home, renal function unremarkable.  Initial CK7 34, trended down.   Hypertension: Continue amlodipine and atenolol.  IV as needed   Hyperlipidemia: Continue atorvastatin.  PT/OT-recommended home health.  Patient was doing remarkably well with ambulation with physical therapy   Consultations: Oncology  Subjective: Seen and examined at bedside, no complaints.  Patient wishes to go to SNF but I explained to her that she has done very well with physical therapy therefore recommendations were home health from their service.  Discharge Exam: Vitals:   07/12/22 2009 07/13/22 0544  BP: (!) 145/67 (!) 129/58  Pulse: 80 78  Resp: 20 20  Temp: 97.9 F (36.6 C) 98.2 F (36.8 C)  SpO2: 95% 92%   Vitals:   07/12/22 1203 07/12/22 2009 07/13/22 0532 07/13/22 0544  BP: 138/66 (!) 145/67  (!) 129/58  Pulse: 85 80  78  Resp: 16 20  20  Temp: 98.1 F (36.7 C) 97.9 F (36.6 C)  98.2 F (36.8 C)  TempSrc: Oral Oral  Oral  SpO2: 93% 95%  92%  Weight:   75.2 kg   Height:        General: Pt is alert, awake, not in acute distress Cardiovascular: RRR,  S1/S2 +, no rubs, no gallops Respiratory: CTA bilaterally, no wheezing, no rhonchi Abdominal: Soft, NT, ND, bowel sounds + Extremities: no edema, no cyanosis  Discharge Instructions   Allergies as of 07/13/2022   No Known Allergies      Medication List     TAKE these medications    amLODipine 5 MG tablet Commonly known as: NORVASC Take 5 mg by mouth 2 (two) times daily.   atenolol 50 MG tablet Commonly known as: TENORMIN Take 50 mg by mouth at bedtime.   atorvastatin 10 MG tablet Commonly known as: LIPITOR Take 10 mg by mouth daily at 6 PM.   cholecalciferol 25 MCG (1000 UNIT) tablet Commonly known as: VITAMIN D3 Take 1,000 Units by mouth daily.   dexamethasone 4 MG tablet Commonly known as: DECADRON Take 1 tablet (4 mg total) by mouth 2 (two) times daily with a meal for 10 days.   famotidine 10 MG tablet Commonly known as: PEPCID Take 10 mg by mouth at bedtime as needed for heartburn or indigestion.   ibuprofen 200 MG tablet Commonly known as: ADVIL Take 200 mg by mouth every 6 (six) hours as needed for moderate pain.   levETIRAcetam 500 MG tablet Commonly known as: KEPPRA Take 1 tablet (500 mg total) by mouth 2 (two) times daily.   Potassium 99 MG Tabs Take 99 mg by mouth at bedtime.        Follow-up Information     Burnard Bunting, MD Follow up in 1 week(s).   Specialty: Internal Medicine Contact information: [place] [address] [phone]         Burnard Bunting, MD .   Specialty: Internal Medicine Contact information: [place] [address] [place], [facility] Follow up.   Specialty: Home Health Services Why: [name] will provide PT, OT, RN, and aide in the home after discharge. Contact information: [place] [address] [phone]                No Known Allergies  You were cared for by a hospitalist during your hospital stay.  If you have any questions about your discharge medications or the care you received while you were in the hospital after you are discharged, you can call the unit and asked to speak with the hospitalist on call if the hospitalist that took care of you is not available. Once you are discharged, your primary care physician will handle any further medical issues. Please note that no refills for any discharge medications will be authorized once you are discharged, as it is imperative that you return to your primary care physician (or establish a relationship with a primary care physician if you do not have one) for your aftercare needs so that they can reassess your need for medications and monitor your lab values.   Procedures/Studies: EEG adult  Result Date: Jul 18, 2022 Lora Havens, MD     18-Jul-2022  9:27 PM Patient Name: Lauren Hill MRN: [id number] Epilepsy Attending: Lora Havens Referring Physician/Provider: Damita Lack, MD Date: July 18, 2022 Duration: 23.17 mins Patient  history: 88 year old female with right temporal high-grade glioma status post craniotomy.  EEG to evaluate for seizure. Level of alertness: Awake, asleep AEDs during EEG study: LEV Technical aspects: This EEG study was done with scalp electrodes positioned according to the 10-20 International system of electrode placement. Electrical activity was reviewed with band pass filter of 1-'70Hz'$ , sensitivity of 7 uV/mm, display speed of 25m/sec with a '60Hz'$  notched filter applied as appropriate. EEG data were recorded continuously and digitally stored.  Video monitoring was available and reviewed as appropriate. Description: The posterior dominant rhythm consists of 9 Hz activity of moderate voltage (25-35 uV) seen predominantly in posterior head regions, asymmetric ( right<left) and reactive to eye opening and eye closing. Sleep was characterized by vertex waves, sleep spindles (12 to 14 Hz), maximal frontocentral region. There is 15  to 18 Hz, 2-3 uV beta activity distributed symmetrically and diffusely. EEG also showed continuous polymorphic sharply contoured 3 to 6 Hz theta-delta slowing in right temporo-parietal region consistent with breach artifact. Hyperventilation and photic stimulation were not performed.   ABNORMALITY - Breach artifact, right temporo-parietal region IMPRESSION: This study is suggestive of cortical dysfunction arising from right temporo-parietal region likely secondary to underlying craniotomy. No seizures or definite epileptiform discharges were seen throughout the recording. If suspicion for interictal activity remains a concern, a prolonged study can be considered. PLora Havens  CT HEAD WO CONTRAST  Result Date: 07/11/2022 CLINICAL DATA:  Fall, history of glioblastoma EXAM: CT HEAD WITHOUT CONTRAST CT CERVICAL SPINE WITHOUT CONTRAST TECHNIQUE: Multidetector CT imaging of the head and cervical spine was performed following the standard protocol without intravenous contrast. Multiplanar CT image reconstructions of the cervical spine were also generated. RADIATION DOSE REDUCTION: This exam was performed according to the departmental dose-optimization program which includes automated exposure control, adjustment of the mA and/or kV according to patient size and/or use of iterative reconstruction technique. COMPARISON:  MR brain, 07/08/2022 FINDINGS: CT HEAD FINDINGS Brain: Unchanged appearance of the brain, compared to recent prior MR, with severe, diffuse edema involving the mid and posterior right cerebral hemisphere, with mass effect on the right lateral ventricle and right-to-left midline shift of approximately 0.6 cm, as well as underlying periventricular and deep white matter hypodensity. No hemorrhage or other new finding. Vascular: No hyperdense vessel or unexpected calcification. Skull: Status post right parietal craniotomy. Negative for fracture or focal lesion. Sinuses/Orbits: No acute finding. Other:  None. CT CERVICAL SPINE FINDINGS Alignment: Normal. Skull base and vertebrae: No acute fracture. No primary bone lesion or focal pathologic process. Soft tissues and spinal canal: No prevertebral fluid or swelling. No visible canal hematoma. Disc levels: Severe multilevel disc space height loss and osteophytosis from C4-C7. Upper chest: Negative. Other: None. IMPRESSION: 1. Unchanged appearance of the brain, compared to recent prior MR, with severe, diffuse edema involving the mid and posterior right cerebral hemisphere, with mass effect on the right lateral ventricle and right-to-left midline shift of approximately 0.6 cm, as well as underlying periventricular and deep white matter hypodensity. Findings are in keeping with known glioblastoma. 2. No hemorrhage or other new finding. 3. No fracture or static subluxation of the cervical spine. 4. Severe multilevel cervical disc degenerative disease. Electronically Signed   By: ADelanna AhmadiM.D.   On: 07/11/2022 14:22   CT CERVICAL SPINE WO CONTRAST  Result Date: 07/11/2022 CLINICAL DATA:  Fall, history of glioblastoma EXAM: CT HEAD WITHOUT CONTRAST CT CERVICAL SPINE WITHOUT CONTRAST TECHNIQUE: Multidetector CT imaging of the head and cervical  spine was performed following the standard protocol without intravenous contrast. Multiplanar CT image reconstructions of the cervical spine were also generated. RADIATION DOSE REDUCTION: This exam was performed according to the departmental dose-optimization program which includes automated exposure control, adjustment of the mA and/or kV according to patient size and/or use of iterative reconstruction technique. COMPARISON:  MR brain, 07/08/2022 FINDINGS: CT HEAD FINDINGS Brain: Unchanged appearance of the brain, compared to recent prior MR, with severe, diffuse edema involving the mid and posterior right cerebral hemisphere, with mass effect on the right lateral ventricle and right-to-left midline shift of approximately 0.6  cm, as well as underlying periventricular and deep white matter hypodensity. No hemorrhage or other new finding. Vascular: No hyperdense vessel or unexpected calcification. Skull: Status post right parietal craniotomy. Negative for fracture or focal lesion. Sinuses/Orbits: No acute finding. Other: None. CT CERVICAL SPINE FINDINGS Alignment: Normal. Skull base and vertebrae: No acute fracture. No primary bone lesion or focal pathologic process. Soft tissues and spinal canal: No prevertebral fluid or swelling. No visible canal hematoma. Disc levels: Severe multilevel disc space height loss and osteophytosis from C4-C7. Upper chest: Negative. Other: None. IMPRESSION: 1. Unchanged appearance of the brain, compared to recent prior MR, with severe, diffuse edema involving the mid and posterior right cerebral hemisphere, with mass effect on the right lateral ventricle and right-to-left midline shift of approximately 0.6 cm, as well as underlying periventricular and deep white matter hypodensity. Findings are in keeping with known glioblastoma. 2. No hemorrhage or other new finding. 3. No fracture or static subluxation of the cervical spine. 4. Severe multilevel cervical disc degenerative disease. Electronically Signed   By: Delanna Ahmadi M.D.   On: 07/11/2022 14:22   DG Pelvis 1-2 Views  Result Date: 07/11/2022 CLINICAL DATA:  Status post fall. EXAM: PELVIS - 1-2 VIEW COMPARISON:  10/27/2009 FINDINGS: The patient is status post bilateral hip arthroplasty. The hardware components are in anatomic alignment without signs of fracture or dislocation. No signs of pelvic fracture or diastasis. IMPRESSION: Status post bilateral hip arthroplasty. No acute findings. Electronically Signed   By: Kerby Moors M.D.   On: 07/11/2022 14:02   DG Chest Portable 1 View  Result Date: 07/11/2022 CLINICAL DATA:  Status post fall. EXAM: PORTABLE CHEST 1 VIEW COMPARISON:  03/23/2022 FINDINGS: Normal heart size. No pleural effusion or  edema. Decreased lung volumes. No airspace opacities. Severe degenerative changes noted in the right glenohumeral joint. Prior left shoulder arthroplasty. IMPRESSION: Low lung volumes. No acute findings. Electronically Signed   By: Kerby Moors M.D.   On: 07/11/2022 14:00   MR BRAIN W WO CONTRAST  Result Date: 07/09/2022 CLINICAL DATA:  High-grade glioma with a resection, follow-up. EXAM: MRI HEAD WITHOUT AND WITH CONTRAST TECHNIQUE: Multiplanar, multiecho pulse sequences of the brain and surrounding structures were obtained without and with intravenous contrast. CONTRAST:  63m GADAVIST GADOBUTROL 1 MMOL/ML IV SOLN COMPARISON:  03/30/2022 FINDINGS: Brain: Extensive enhancement with necrotic features in the right posterior temporal resection area with stellate appearing mass measuring up to 7 cm anterior to posterior and 2.3 cm in thickness. Regional T2 hyperintensity is much progressed with extensive swelling and expansion of the right hemispheric white matter, midline shift towards the left measuring 5 mm. New or progressive ependymal enhancement at the temporal horn of the right lateral ventricle, the mass is broadly contiguous with the right lateral ventricle. No incidental infarct or significant postoperative collection. No entrapment hydrocephalus Vascular: Major flow voids and vascular enhancements are preserved Skull  and upper cervical spine: Unremarkable craniotomy site Sinuses/Orbits: Negative IMPRESSION: Much progressed enhancement and T2 signal with swelling at the right temporal lobe mass with new ependymal enhancement at the temporal horn of the right lateral ventricle. Midline shift measures 5 mm. Electronically Signed   By: Jorje Guild M.D.   On: 07/09/2022 04:43     The results of significant diagnostics from this hospitalization (including imaging, microbiology, ancillary and laboratory) are listed below for reference.     Microbiology: No results found for this or any previous visit  (from the past 240 hour(s)).   Labs: BNP (last 3 results) No results for input(s): "BNP" in the last 8760 hours. Basic Metabolic Panel: Recent Labs  Lab 07/11/22 1432 07/12/22 0450 07/13/22 0435  NA 137 138 138  K 3.8 3.7 4.1  CL 103 107 110  CO2 24 21* 21*  GLUCOSE 133* 141* 143*  BUN 25* 19 22  CREATININE 0.84 0.68 0.65  CALCIUM 9.3 9.2 9.3  MG  --   --  2.3   Liver Function Tests: Recent Labs  Lab 07/11/22 1432  AST 40  ALT 22  ALKPHOS 58  BILITOT 0.9  PROT 7.6  ALBUMIN 4.0   No results for input(s): "LIPASE", "AMYLASE" in the last 168 hours. No results for input(s): "AMMONIA" in the last 168 hours. CBC: Recent Labs  Lab 07/11/22 1432 07/12/22 0450 07/13/22 0435  WBC 15.5* 11.1* 15.8*  NEUTROABS 13.8*  --   --   HGB 13.8 12.6 11.7*  HCT 41.1 39.4 36.8  MCV 94.3 95.9 95.3  PLT 250 232 214   Cardiac Enzymes: Recent Labs  Lab 07/11/22 1432 07/12/22 0450  CKTOTAL 734* 368*   BNP: Invalid input(s): "POCBNP" CBG: Recent Labs  Lab 07/11/22 1331  GLUCAP 137*   D-Dimer No results for input(s): "DDIMER" in the last 72 hours. Hgb A1c No results for input(s): "HGBA1C" in the last 72 hours. Lipid Profile No results for input(s): "CHOL", "HDL", "LDLCALC", "TRIG", "CHOLHDL", "LDLDIRECT" in the last 72 hours. Thyroid function studies No results for input(s): "TSH", "T4TOTAL", "T3FREE", "THYROIDAB" in the last 72 hours.  Invalid input(s): "FREET3" Anemia work up No results for input(s): "VITAMINB12", "FOLATE", "FERRITIN", "TIBC", "IRON", "RETICCTPCT" in the last 72 hours. Urinalysis    Component Value Date/Time   COLORURINE YELLOW 07/11/2022 1718   APPEARANCEUR CLEAR 07/11/2022 1718   LABSPEC 1.015 07/11/2022 1718   PHURINE 6.0 07/11/2022 1718   GLUCOSEU NEGATIVE 07/11/2022 1718   HGBUR NEGATIVE 07/11/2022 1718   BILIRUBINUR NEGATIVE 07/11/2022 1718   BILIRUBINUR neg 11/23/2021 1424   KETONESUR 20 (A) 07/11/2022 1718   PROTEINUR NEGATIVE  07/11/2022 1718   UROBILINOGEN 0.2 11/23/2021 1424   UROBILINOGEN 0.2 10/20/2009 1150   NITRITE NEGATIVE 07/11/2022 1718   LEUKOCYTESUR NEGATIVE 07/11/2022 1718   Sepsis Labs Recent Labs  Lab 07/11/22 1432 07/12/22 0450 07/13/22 0435  WBC 15.5* 11.1* 15.8*   Microbiology No results found for this or any previous visit (from the past 240 hour(s)).   Time coordinating discharge:  I have spent 35 minutes face to face with the patient and on the ward discussing the patients care, assessment, plan and disposition with other care givers. >50% of the time was devoted counseling the patient about the risks and benefits of treatment/Discharge disposition and coordinating care.   SIGNED:   Damita Lack, MD  [facility] 07/13/2022, 10:48 AM   If 7PM-7AM, please contact night-coverage

## 2022-07-13 NOTE — Progress Notes (Signed)
Mobility Specialist - Progress Note   07/13/22 1006  Mobility  Activity Ambulated with assistance in hallway  Level of Assistance Standby assist, set-up cues, supervision of patient - no hands on  Assistive Device Front wheel walker  Distance Ambulated (ft) 535 ft  Activity Response Tolerated well  Mobility Referral Yes  $Mobility charge 1 Mobility   Pt received in bed and agreeable to mobility. No complaints during session. Pt to bed after session with all needs met & family in room.   [facility]

## 2022-07-17 DIAGNOSIS — M5030 Other cervical disc degeneration, unspecified cervical region: Secondary | ICD-10-CM | POA: Diagnosis not present

## 2022-07-17 DIAGNOSIS — Z7952 Long term (current) use of systemic steroids: Secondary | ICD-10-CM | POA: Diagnosis not present

## 2022-07-17 DIAGNOSIS — Z9181 History of falling: Secondary | ICD-10-CM | POA: Diagnosis not present

## 2022-07-17 DIAGNOSIS — T796XXD Traumatic ischemia of muscle, subsequent encounter: Secondary | ICD-10-CM | POA: Diagnosis not present

## 2022-07-17 DIAGNOSIS — D72829 Elevated white blood cell count, unspecified: Secondary | ICD-10-CM | POA: Diagnosis not present

## 2022-07-17 DIAGNOSIS — K219 Gastro-esophageal reflux disease without esophagitis: Secondary | ICD-10-CM | POA: Diagnosis not present

## 2022-07-17 DIAGNOSIS — C712 Malignant neoplasm of temporal lobe: Secondary | ICD-10-CM | POA: Diagnosis not present

## 2022-07-17 DIAGNOSIS — E785 Hyperlipidemia, unspecified: Secondary | ICD-10-CM | POA: Diagnosis not present

## 2022-07-17 DIAGNOSIS — I10 Essential (primary) hypertension: Secondary | ICD-10-CM | POA: Diagnosis not present

## 2022-07-17 DIAGNOSIS — Z96643 Presence of artificial hip joint, bilateral: Secondary | ICD-10-CM | POA: Diagnosis not present

## 2022-07-19 ENCOUNTER — Inpatient Hospital Stay (HOSPITAL_BASED_OUTPATIENT_CLINIC_OR_DEPARTMENT_OTHER): Payer: PPO | Admitting: Internal Medicine

## 2022-07-19 ENCOUNTER — Other Ambulatory Visit: Payer: Self-pay

## 2022-07-19 VITALS — BP 166/70 | HR 85 | Temp 97.3°F | Resp 14 | Wt 165.6 lb

## 2022-07-19 DIAGNOSIS — Z7189 Other specified counseling: Secondary | ICD-10-CM

## 2022-07-19 DIAGNOSIS — C719 Malignant neoplasm of brain, unspecified: Secondary | ICD-10-CM

## 2022-07-19 DIAGNOSIS — K219 Gastro-esophageal reflux disease without esophagitis: Secondary | ICD-10-CM | POA: Diagnosis not present

## 2022-07-19 DIAGNOSIS — E785 Hyperlipidemia, unspecified: Secondary | ICD-10-CM | POA: Diagnosis not present

## 2022-07-19 DIAGNOSIS — Z923 Personal history of irradiation: Secondary | ICD-10-CM | POA: Diagnosis not present

## 2022-07-19 DIAGNOSIS — Z7952 Long term (current) use of systemic steroids: Secondary | ICD-10-CM | POA: Diagnosis not present

## 2022-07-19 DIAGNOSIS — R5383 Other fatigue: Secondary | ICD-10-CM | POA: Diagnosis not present

## 2022-07-19 DIAGNOSIS — I10 Essential (primary) hypertension: Secondary | ICD-10-CM | POA: Diagnosis not present

## 2022-07-19 DIAGNOSIS — Z79899 Other long term (current) drug therapy: Secondary | ICD-10-CM | POA: Diagnosis not present

## 2022-07-19 MED ORDER — DEXAMETHASONE 4 MG PO TABS: 4.0000 mg | ORAL_TABLET | Freq: Every day | ORAL | 0 refills | Status: DC

## 2022-07-19 NOTE — Progress Notes (Signed)
[facility] at [place], [place] [address] [phone]   Interval Evaluation  Date of Service: 07/19/22 Patient Name: Lauren Hill Patient MRN: [id number] Patient DOB: Oct 09, 1934 Provider: Ventura Sellers, MD  Identifying Statement:  Lauren Hill is a 88 y.o. female with right temporal  high grade glioma    Oncologic History: Oncology History  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 [facility])  03/29/2022 Surgery   Craniotomy, resection of right temporal mass with Dr. Arnoldo Morale; path demonstrates high grade glioma, IDH pending   04/08/2022 Initial Diagnosis   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 ([place])   05/24/2022 -  Radiation Therapy   3 weeks IMRT with Dr. Lisbeth Renshaw without TMZ     Biomarkers:  MGMT Unknown.  IDH 1/2 Wild type.  EGFR Unknown  TERT Unknown   Interval History: Lauren Hill presents today, now having completed radiation therapy.  No significant new or progressive changes today.  She continues to function at her baseline prior to recent hospitalization, dosing decadron 69m twice per day.  Fatigue and sleep volume have improved.  She is planning to move to smaller apartment with more family and caregiver support.  H+P (04/22/22) Patient presented last month with episode of LOC while at church.  She was "out for short time" then returned to normal shortly thereafter.  CNS imaging demonstrates large enhancing right temporal mass.  She underwent craniotomy, resection with Dr. JArnoldo Moraleon 03/29/22; prelim path was high grade glioma pending final.  Since surgery, she complains of some issues with hearing, otherwise functionally intact.  Walking on her own with the walker, energy level is good.  Continues on Keppra 5020mtwice per day, Decadron 33m59mwice per day.  Medications: Current Outpatient Medications on File Prior to Visit  Medication Sig Dispense Refill   amLODipine (NORVASC)  5 MG tablet Take 5 mg by mouth 2 (two) times daily.      atenolol (TENORMIN) 50 MG tablet Take 50 mg by mouth at bedtime.     atorvastatin (LIPITOR) 10 MG tablet Take 10 mg by mouth daily at 6 PM.      cholecalciferol (VITAMIN D3) 25 MCG (1000 UNIT) tablet Take 1,000 Units by mouth daily.     dexamethasone (DECADRON) 4 MG tablet Take 1 tablet (4 mg total) by mouth 2 (two) times daily with a meal for 10 days. 20 tablet 0   famotidine (PEPCID) 10 MG tablet Take 10 mg by mouth at bedtime as needed for heartburn or indigestion.     ibuprofen (ADVIL) 200 MG tablet Take 200 mg by mouth every 6 (six) hours as needed for moderate pain.     levETIRAcetam (KEPPRA) 500 MG tablet Take 1 tablet (500 mg total) by mouth 2 (two) times daily. 60 tablet 2   Potassium 99 MG TABS Take 99 mg by mouth at bedtime.     No current facility-administered medications on file prior to visit.    Allergies: No Known Allergies Past Medical History:  Past Medical History:  Diagnosis Date   Arthritis    GERD (gastroesophageal reflux disease)    indigestion   History of blood transfusion    as an infant   History of hiatal hernia    History of pneumonia    Hyperlipidemia    Hypertension    Renal cyst    Past Surgical History:  Past Surgical History:  Procedure Laterality Date   APPLICATION  OF CRANIAL NAVIGATION Right 03/29/2022   Procedure: APPLICATION OF CRANIAL NAVIGATION;  Surgeon: Newman Pies, MD;  Location: [facility] [facility];  Service: Neurosurgery;  Laterality: Right;   COLONOSCOPY     CRANIOTOMY Right 03/29/2022   Procedure: CRANIOTOMY TUMOR EXCISION;  Surgeon: Newman Pies, MD;  Location: [facility];  Service: Neurosurgery;  Laterality: Right;   DILATION AND CURETTAGE OF UTERUS     x2   EYE SURGERY     cataracts   HIP SURGERY  5/05   right hip replacement   HIP SURGERY  5/11   left hip replacement   ORIF HUMERUS FRACTURE Left 11/11/2017   Procedure: OPEN REDUCTION INTERNAL FIXATION (ORIF) LEFT DISTAL HUMERUS  FRACTURE;  Surgeon: Shona Needles, MD;  Location: [facility];  Service: Orthopedics;  Laterality: Left;   RHINOPLASTY     TONSILLECTOMY     TOTAL SHOULDER ARTHROPLASTY Left 04/01/2016   Procedure: LEFT TOTAL SHOULDER ARTHROPLASTY;  Surgeon: Justice Britain, MD;  Location: [facility];  Service: Orthopedics;  Laterality: Left;   Social History:  Social History   Socioeconomic History   Marital status: Widowed    Spouse name: Not on file   Number of children: Not on file   Years of education: Not on file   Highest education level: Not on file  Occupational History   Not on file  Tobacco Use   Smoking status: Never   Smokeless tobacco: Never  Vaping Use   Vaping Use: Never used  Substance and Sexual Activity   Alcohol use: No   Drug use: No   Sexual activity: Not Currently  Other Topics Concern   Not on file  Social History Narrative   Not on file   Social Determinants of Health   Financial Resource Strain: Low Risk  (04/12/2022)   Overall Financial Resource Strain (CARDIA)    Difficulty of Paying Living Expenses: Not hard at all  Food Insecurity: No Food Insecurity (07/11/2022)   Hunger Vital Sign    Worried About Running Out of Food in the Last Year: Never true    [place] in the Last Year: Never true  Transportation Needs: No Transportation Needs (07/11/2022)   PRAPARE - Hydrologist (Medical): No    Lack of Transportation (Non-Medical): No  Physical Activity: Not on file  Stress: Not on file  Social Connections: Not on file  Intimate Partner Violence: Not At Risk (07/11/2022)   Humiliation, Afraid, Rape, and Kick questionnaire    Fear of Current or Ex-Partner: No    Emotionally Abused: No    Physically Abused: No    Sexually Abused: No   Family History:  Family History  Problem Relation Age of Onset   Heart disease Mother     Review of Systems: Constitutional: Doesn't report fevers, chills or abnormal weight loss Eyes: Doesn't report  blurriness of vision Ears, nose, mouth, throat, and face: Doesn't report sore throat Respiratory: Doesn't report cough, dyspnea or wheezes Cardiovascular: Doesn't report palpitation, chest discomfort  Gastrointestinal:  Doesn't report nausea, constipation, diarrhea GU: Doesn't report incontinence Skin: Doesn't report skin rashes Neurological: Per HPI Musculoskeletal: Doesn't report joint pain Behavioral/Psych: Doesn't report anxiety  Physical Exam: Vitals:   07/19/22 1421  BP: (!) 166/70  Pulse: 85  Resp: 14  Temp: (!) 97.3 F (36.3 C)  SpO2: 97%   KPS: 70. ECOG 1  General: Alert, cooperative, pleasant, in no acute distress Head: Normal EENT: No conjunctival injection or scleral icterus.  Lungs: Resp effort normal Cardiac: Regular rate Abdomen: Non-distended abdomen Skin: No rashes cyanosis or petechiae. Extremities: No clubbing or edema  Neurologic Exam: Mental Status: Awake, alert, attentive to examiner. Oriented to self and environment. Language is fluent with intact comprehension.  Cranial Nerves: Visual acuity is grossly normal. Visual fields are full. Extra-ocular movements intact. No ptosis. Face is symmetric Motor: Tone and bulk are normal. Power is full in both arms and legs. Reflexes are symmetric, no pathologic reflexes present.  Sensory: Intact to light touch Gait: Orthopedic and neurologic dystaxia   Labs: I have reviewed the data as listed    Component Value Date/Time   NA 138 07/13/2022 0435   K 4.1 07/13/2022 0435   CL 110 07/13/2022 0435   CO2 21 (L) 07/13/2022 0435   GLUCOSE 143 (H) 07/13/2022 0435   BUN 22 07/13/2022 0435   CREATININE 0.65 07/13/2022 0435   CALCIUM 9.3 07/13/2022 0435   CALCIUM 8.8 11/11/2017 0637   PROT 7.6 07/11/2022 1432   ALBUMIN 4.0 07/11/2022 1432   AST 40 07/11/2022 1432   ALT 22 07/11/2022 1432   ALKPHOS 58 07/11/2022 1432   BILITOT 0.9 07/11/2022 1432   GFRNONAA >60 07/13/2022 0435   GFRAA >60 11/11/2017 0637    Lab Results  Component Value Date   WBC 15.8 (H) 07/13/2022   NEUTROABS 13.8 (H) 07/11/2022   HGB 11.7 (L) 07/13/2022   HCT 36.8 07/13/2022   MCV 95.3 07/13/2022   PLT 214 07/13/2022      Assessment/Plan 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 ([place])  Goals of care, counseling/discussion  Lauren Hill is clinically improved, now on decadron for presumed pseudo-progression following 3 weeks of IMRT.  We recommended decreasing decadron to 82m daily if tolerated.  Will con't to work with family and caregivers on her living situation transition.  Will con't Keppra 5058mBID as prior.  We ask that ElJabier Muttoneturn to clinic in 3-4 weeks with brain MRI for evaluation, or sooner as needed.  All questions were answered. The patient knows to call the clinic with any problems, questions or concerns. No barriers to learning were detected.  The total time spent in the encounter was 30 minutes and more than 50% was on counseling and review of test results   ZaVentura SellersMD Medical Director of Neuro-Oncology [facility]2/12/24 2:36 PM

## 2022-07-22 DIAGNOSIS — C712 Malignant neoplasm of temporal lobe: Secondary | ICD-10-CM | POA: Diagnosis not present

## 2022-07-22 DIAGNOSIS — M5030 Other cervical disc degeneration, unspecified cervical region: Secondary | ICD-10-CM | POA: Diagnosis not present

## 2022-07-22 DIAGNOSIS — T796XXD Traumatic ischemia of muscle, subsequent encounter: Secondary | ICD-10-CM | POA: Diagnosis not present

## 2022-07-22 DIAGNOSIS — D72829 Elevated white blood cell count, unspecified: Secondary | ICD-10-CM | POA: Diagnosis not present

## 2022-07-22 DIAGNOSIS — Z7952 Long term (current) use of systemic steroids: Secondary | ICD-10-CM | POA: Diagnosis not present

## 2022-07-22 DIAGNOSIS — Z9181 History of falling: Secondary | ICD-10-CM | POA: Diagnosis not present

## 2022-07-22 DIAGNOSIS — Z96643 Presence of artificial hip joint, bilateral: Secondary | ICD-10-CM | POA: Diagnosis not present

## 2022-07-22 DIAGNOSIS — I10 Essential (primary) hypertension: Secondary | ICD-10-CM | POA: Diagnosis not present

## 2022-07-22 DIAGNOSIS — E785 Hyperlipidemia, unspecified: Secondary | ICD-10-CM | POA: Diagnosis not present

## 2022-07-22 DIAGNOSIS — K219 Gastro-esophageal reflux disease without esophagitis: Secondary | ICD-10-CM | POA: Diagnosis not present

## 2022-07-26 ENCOUNTER — Ambulatory Visit
Admission: RE | Admit: 2022-07-26 | Discharge: 2022-07-26 | Disposition: A | Discharge status: Home / Self Care | Payer: PPO | Source: Ambulatory Visit | Attending: Radiation Oncology | Admitting: Radiation Oncology

## 2022-07-26 NOTE — Progress Notes (Signed)
  Radiation Oncology         [phone] ________________________________  Name: Lauren Hill MRN: [id number]  Date of Service: 07/26/2022  DOB: [birth year]  Post Treatment Telephone Note  Diagnosis:  High grade Glioma of the Right Temporal lobe   Intent: Palliative  Radiation Treatment Dates: 05/24/2022 through 06/15/2022 Site Technique Total Dose (Gy) Dose per Fx (Gy) Completed Fx Beam Energies  Brain: Brain IMRT 40.05/40.05 2.67 15/15 6X    (as documented in provider EOT note)   The patient was available for call today.  The patient did note fatigue during radiation but has since improved. The patient did not note hair loss or skin changes in the field of radiation during therapy. The patient is taking dexamethasone. The patient does not have symptoms of  weakness or loss of control of the extremities. The patient does not have symptoms of headache. The patient does not have symptoms of seizure or uncontrolled movement. The patient does not have symptoms of changes in vision. The patient does not have changes in speech. The patient does not have confusion.   The patient was counseled that she will be contacted by our brain and spine navigator to schedule surveillance imaging. The patient was encouraged to call if  she have not received a call to schedule imaging, or if she develop concerns or questions regarding radiation. The patient will also continue to follow up with Dr. Mickeal Skinner in medical oncology.  This concludes the interview.   Leandra Kern, LPN

## 2022-07-27 ENCOUNTER — Other Ambulatory Visit: Payer: Self-pay | Admitting: Radiation Therapy

## 2022-07-27 DIAGNOSIS — Z029 Encounter for administrative examinations, unspecified: Secondary | ICD-10-CM | POA: Diagnosis not present

## 2022-07-27 DIAGNOSIS — C719 Malignant neoplasm of brain, unspecified: Secondary | ICD-10-CM | POA: Diagnosis not present

## 2022-07-27 DIAGNOSIS — Z111 Encounter for screening for respiratory tuberculosis: Secondary | ICD-10-CM | POA: Diagnosis not present

## 2022-07-27 DIAGNOSIS — G40909 Epilepsy, unspecified, not intractable, without status epilepticus: Secondary | ICD-10-CM | POA: Diagnosis not present

## 2022-07-27 DIAGNOSIS — I10 Essential (primary) hypertension: Secondary | ICD-10-CM | POA: Diagnosis not present

## 2022-07-27 DIAGNOSIS — E785 Hyperlipidemia, unspecified: Secondary | ICD-10-CM | POA: Diagnosis not present

## 2022-08-03 ENCOUNTER — Telehealth: Payer: Self-pay | Admitting: Internal Medicine

## 2022-08-03 DIAGNOSIS — E785 Hyperlipidemia, unspecified: Secondary | ICD-10-CM | POA: Diagnosis not present

## 2022-08-03 DIAGNOSIS — T796XXD Traumatic ischemia of muscle, subsequent encounter: Secondary | ICD-10-CM | POA: Diagnosis not present

## 2022-08-03 DIAGNOSIS — I10 Essential (primary) hypertension: Secondary | ICD-10-CM | POA: Diagnosis not present

## 2022-08-03 DIAGNOSIS — D72829 Elevated white blood cell count, unspecified: Secondary | ICD-10-CM | POA: Diagnosis not present

## 2022-08-03 DIAGNOSIS — Z9181 History of falling: Secondary | ICD-10-CM | POA: Diagnosis not present

## 2022-08-03 DIAGNOSIS — M5030 Other cervical disc degeneration, unspecified cervical region: Secondary | ICD-10-CM | POA: Diagnosis not present

## 2022-08-03 DIAGNOSIS — Z96643 Presence of artificial hip joint, bilateral: Secondary | ICD-10-CM | POA: Diagnosis not present

## 2022-08-03 DIAGNOSIS — K219 Gastro-esophageal reflux disease without esophagitis: Secondary | ICD-10-CM | POA: Diagnosis not present

## 2022-08-03 DIAGNOSIS — C712 Malignant neoplasm of temporal lobe: Secondary | ICD-10-CM | POA: Diagnosis not present

## 2022-08-03 DIAGNOSIS — Z7952 Long term (current) use of systemic steroids: Secondary | ICD-10-CM | POA: Diagnosis not present

## 2022-08-03 NOTE — Telephone Encounter (Signed)
Called patient regarding upcoming March appointment, left a voicemail.

## 2022-08-05 ENCOUNTER — Telehealth: Payer: Self-pay | Admitting: Internal Medicine

## 2022-08-05 NOTE — Telephone Encounter (Signed)
Per 2/29 IB reached out to daughter regarding appointments, daughter aware of date and time of appointments.

## 2022-08-09 DIAGNOSIS — Z9181 History of falling: Secondary | ICD-10-CM | POA: Diagnosis not present

## 2022-08-09 DIAGNOSIS — I10 Essential (primary) hypertension: Secondary | ICD-10-CM | POA: Diagnosis not present

## 2022-08-09 DIAGNOSIS — E785 Hyperlipidemia, unspecified: Secondary | ICD-10-CM | POA: Diagnosis not present

## 2022-08-09 DIAGNOSIS — C712 Malignant neoplasm of temporal lobe: Secondary | ICD-10-CM | POA: Diagnosis not present

## 2022-08-09 DIAGNOSIS — M5030 Other cervical disc degeneration, unspecified cervical region: Secondary | ICD-10-CM | POA: Diagnosis not present

## 2022-08-09 DIAGNOSIS — D72829 Elevated white blood cell count, unspecified: Secondary | ICD-10-CM | POA: Diagnosis not present

## 2022-08-09 DIAGNOSIS — T796XXD Traumatic ischemia of muscle, subsequent encounter: Secondary | ICD-10-CM | POA: Diagnosis not present

## 2022-08-09 DIAGNOSIS — Z96643 Presence of artificial hip joint, bilateral: Secondary | ICD-10-CM | POA: Diagnosis not present

## 2022-08-09 DIAGNOSIS — K219 Gastro-esophageal reflux disease without esophagitis: Secondary | ICD-10-CM | POA: Diagnosis not present

## 2022-08-09 DIAGNOSIS — Z7952 Long term (current) use of systemic steroids: Secondary | ICD-10-CM | POA: Diagnosis not present

## 2022-08-10 ENCOUNTER — Ambulatory Visit (HOSPITAL_COMMUNITY)
Admission: RE | Admit: 2022-08-10 | Discharge: 2022-08-10 | Disposition: A | Discharge status: Home / Self Care | Payer: PPO | Source: Ambulatory Visit | Attending: Internal Medicine | Admitting: Internal Medicine

## 2022-08-10 DIAGNOSIS — C719 Malignant neoplasm of brain, unspecified: Secondary | ICD-10-CM | POA: Diagnosis not present

## 2022-08-10 DIAGNOSIS — C729 Malignant neoplasm of central nervous system, unspecified: Secondary | ICD-10-CM | POA: Diagnosis not present

## 2022-08-10 MED ORDER — GADOBUTROL 1 MMOL/ML IV SOLN
7.0000 mL | Freq: Once | INTRAVENOUS | Status: AC | PRN
  Administered 2022-08-10: 7 mL via INTRAVENOUS

## 2022-08-12 ENCOUNTER — Ambulatory Visit
Admission: RE | Admit: 2022-08-12 | Discharge: 2022-08-12 | Disposition: A | Discharge status: Home / Self Care | Payer: Self-pay | Source: Ambulatory Visit | Attending: Internal Medicine | Admitting: Internal Medicine

## 2022-08-12 ENCOUNTER — Other Ambulatory Visit: Payer: Self-pay | Admitting: *Deleted

## 2022-08-12 ENCOUNTER — Encounter: Payer: Self-pay | Admitting: *Deleted

## 2022-08-12 DIAGNOSIS — C719 Malignant neoplasm of brain, unspecified: Secondary | ICD-10-CM

## 2022-08-16 ENCOUNTER — Inpatient Hospital Stay: Payer: PPO | Attending: Internal Medicine

## 2022-08-16 DIAGNOSIS — C719 Malignant neoplasm of brain, unspecified: Secondary | ICD-10-CM | POA: Insufficient documentation

## 2022-08-16 DIAGNOSIS — Z923 Personal history of irradiation: Secondary | ICD-10-CM | POA: Insufficient documentation

## 2022-08-16 DIAGNOSIS — I10 Essential (primary) hypertension: Secondary | ICD-10-CM | POA: Insufficient documentation

## 2022-08-16 DIAGNOSIS — Z79899 Other long term (current) drug therapy: Secondary | ICD-10-CM | POA: Insufficient documentation

## 2022-08-17 ENCOUNTER — Other Ambulatory Visit: Payer: Self-pay

## 2022-08-17 ENCOUNTER — Inpatient Hospital Stay (HOSPITAL_BASED_OUTPATIENT_CLINIC_OR_DEPARTMENT_OTHER): Payer: PPO | Admitting: Internal Medicine

## 2022-08-17 VITALS — BP 160/84 | HR 82 | Temp 97.9°F | Resp 16 | Wt 159.6 lb

## 2022-08-17 DIAGNOSIS — Z7189 Other specified counseling: Secondary | ICD-10-CM

## 2022-08-17 DIAGNOSIS — Z79899 Other long term (current) drug therapy: Secondary | ICD-10-CM | POA: Diagnosis not present

## 2022-08-17 DIAGNOSIS — Z923 Personal history of irradiation: Secondary | ICD-10-CM | POA: Diagnosis not present

## 2022-08-17 DIAGNOSIS — C719 Malignant neoplasm of brain, unspecified: Secondary | ICD-10-CM

## 2022-08-17 DIAGNOSIS — I10 Essential (primary) hypertension: Secondary | ICD-10-CM | POA: Diagnosis not present

## 2022-08-17 MED ORDER — DEXAMETHASONE 1 MG PO TABS: ORAL_TABLET | ORAL | 0 refills | Status: DC

## 2022-08-17 NOTE — Progress Notes (Signed)
[facility] at [facility] [place], [place] [address] [phone]   Interval Evaluation  Date of Service: 08/17/22 Patient Name: Lauren Hill Patient MRN: [id number] Patient DOB: 07/07/1934 Provider: Ventura Sellers, MD  Identifying Statement:  Lauren Hill is a 88 y.o. female with right temporal  high grade glioma    Oncologic History: Oncology History  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 [facility])  03/29/2022 Surgery   Craniotomy, resection of right temporal mass with Dr. Arnoldo Morale; path demonstrates high grade glioma, IDH pending   04/08/2022 Initial Diagnosis   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 ([place])   05/24/2022 -  Radiation Therapy   3 weeks IMRT with Dr. Lisbeth Renshaw without TMZ     Biomarkers:  MGMT Unknown.  IDH 1/2 Wild type.  EGFR Unknown  TERT Unknown   Interval History: Lauren Hill presents today, now having completed radiation therapy.  No significant new or progressive changes today.  She continues to function at her baseline prior to recent hospitalization, dosing decadron '4mg'$  once per day.  Fatigue and sleep volume have improved.  She is planning to move to smaller apartment with more family and caregiver support.  H+P (04/22/22) Patient presented last month with episode of LOC while at church.  She was "out for short time" then returned to normal shortly thereafter.  CNS imaging demonstrates large enhancing right temporal mass.  She underwent craniotomy, resection with Dr. Arnoldo Morale on 03/29/22; prelim path was high grade glioma pending final.  Since surgery, she complains of some issues with hearing, otherwise functionally intact.  Walking on her own with the walker, energy level is good.  Continues on Keppra '500mg'$  twice per day, Decadron '2mg'$  twice per day.  Medications: Current Outpatient Medications on File Prior to Visit  Medication Sig Dispense Refill   amLODipine (NORVASC)  5 MG tablet Take 5 mg by mouth 2 (two) times daily.      atenolol (TENORMIN) 50 MG tablet Take 50 mg by mouth at bedtime.     atorvastatin (LIPITOR) 10 MG tablet Take 10 mg by mouth daily at 6 PM.      cholecalciferol (VITAMIN D3) 25 MCG (1000 UNIT) tablet Take 1,000 Units by mouth daily.     dexamethasone (DECADRON) 4 MG tablet Take 1 tablet (4 mg total) by mouth daily. 60 tablet 0   famotidine (PEPCID) 10 MG tablet Take 10 mg by mouth at bedtime as needed for heartburn or indigestion.     ibuprofen (ADVIL) 200 MG tablet Take 200 mg by mouth every 6 (six) hours as needed for moderate pain.     levETIRAcetam (KEPPRA) 500 MG tablet Take 1 tablet (500 mg total) by mouth 2 (two) times daily. 60 tablet 2   Potassium 99 MG TABS Take 99 mg by mouth at bedtime.     No current facility-administered medications on file prior to visit.    Allergies: No Known Allergies Past Medical History:  Past Medical History:  Diagnosis Date   Arthritis    GERD (gastroesophageal reflux disease)    indigestion   History of blood transfusion    as an infant   History of hiatal hernia    History of pneumonia    Hyperlipidemia    Hypertension    Renal cyst    Past Surgical History:  Past Surgical History:  Procedure Laterality Date   APPLICATION OF CRANIAL NAVIGATION Right 03/29/2022   Procedure: APPLICATION  OF CRANIAL NAVIGATION;  Surgeon: Newman Pies, MD;  Location: [facility];  Service: Neurosurgery;  Laterality: Right;   COLONOSCOPY     CRANIOTOMY Right 03/29/2022   Procedure: CRANIOTOMY TUMOR EXCISION;  Surgeon: Newman Pies, MD;  Location: [facility];  Service: Neurosurgery;  Laterality: Right;   DILATION AND CURETTAGE OF UTERUS     x2   EYE SURGERY     cataracts   HIP SURGERY  5/05   right hip replacement   HIP SURGERY  5/11   left hip replacement   ORIF HUMERUS FRACTURE Left 11/11/2017   Procedure: OPEN REDUCTION INTERNAL FIXATION (ORIF) LEFT DISTAL HUMERUS FRACTURE;  Surgeon: Shona Needles,  MD;  Location: [facility];  Service: Orthopedics;  Laterality: Left;   RHINOPLASTY     TONSILLECTOMY     TOTAL SHOULDER ARTHROPLASTY Left 04/01/2016   Procedure: LEFT TOTAL SHOULDER ARTHROPLASTY;  Surgeon: Justice Britain, MD;  Location: [facility];  Service: Orthopedics;  Laterality: Left;   Social History:  Social History   Socioeconomic History   Marital status: Widowed    Spouse name: Not on file   Number of children: Not on file   Years of education: Not on file   Highest education level: Not on file  Occupational History   Not on file  Tobacco Use   Smoking status: Never   Smokeless tobacco: Never  Vaping Use   Vaping Use: Never used  Substance and Sexual Activity   Alcohol use: No   Drug use: No   Sexual activity: Not Currently  Other Topics Concern   Not on file  Social History Narrative   Not on file   Social Determinants of Health   Financial Resource Strain: Low Risk  (04/12/2022)   Overall Financial Resource Strain (CARDIA)    Difficulty of Paying Living Expenses: Not hard at all  Food Insecurity: No Food Insecurity (07/11/2022)   Hunger Vital Sign    Worried About Running Out of Food in the Last Year: Never true    [place] in the Last Year: Never true  Transportation Needs: No Transportation Needs (07/11/2022)   PRAPARE - Hydrologist (Medical): No    Lack of Transportation (Non-Medical): No  Physical Activity: Not on file  Stress: Not on file  Social Connections: Not on file  Intimate Partner Violence: Not At Risk (07/11/2022)   Humiliation, Afraid, Rape, and Kick questionnaire    Fear of Current or Ex-Partner: No    Emotionally Abused: No    Physically Abused: No    Sexually Abused: No   Family History:  Family History  Problem Relation Age of Onset   Heart disease Mother     Review of Systems: Constitutional: Doesn't report fevers, chills or abnormal weight loss Eyes: Doesn't report blurriness of vision Ears, nose, mouth,  throat, and face: Doesn't report sore throat Respiratory: Doesn't report cough, dyspnea or wheezes Cardiovascular: Doesn't report palpitation, chest discomfort  Gastrointestinal:  Doesn't report nausea, constipation, diarrhea GU: Doesn't report incontinence Skin: Doesn't report skin rashes Neurological: Per HPI Musculoskeletal: Doesn't report joint pain Behavioral/Psych: Doesn't report anxiety  Physical Exam: There were no vitals filed for this visit.  KPS: 70. ECOG 1  General: Alert, cooperative, pleasant, in no acute distress Head: Normal EENT: No conjunctival injection or scleral icterus.  Lungs: Resp effort normal Cardiac: Regular rate Abdomen: Non-distended abdomen Skin: No rashes cyanosis or petechiae. Extremities: No clubbing or edema  Neurologic Exam: Mental Status:  Awake, alert, attentive to examiner. Oriented to self and environment. Language is fluent with intact comprehension.  Cranial Nerves: Visual acuity is grossly normal. Visual fields are full. Extra-ocular movements intact. No ptosis. Face is symmetric Motor: Tone and bulk are normal. Power is full in both arms and legs. Reflexes are symmetric, no pathologic reflexes present.  Sensory: Intact to light touch Gait: Orthopedic and neurologic dystaxia   Labs: I have reviewed the data as listed    Component Value Date/Time   NA 138 07/13/2022 0435   K 4.1 07/13/2022 0435   CL 110 07/13/2022 0435   CO2 21 (L) 07/13/2022 0435   GLUCOSE 143 (H) 07/13/2022 0435   BUN 22 07/13/2022 0435   CREATININE 0.65 07/13/2022 0435   CALCIUM 9.3 07/13/2022 0435   CALCIUM 8.8 11/11/2017 0637   PROT 7.6 07/11/2022 1432   ALBUMIN 4.0 07/11/2022 1432   AST 40 07/11/2022 1432   ALT 22 07/11/2022 1432   ALKPHOS 58 07/11/2022 1432   BILITOT 0.9 07/11/2022 1432   GFRNONAA >60 07/13/2022 0435   GFRAA >60 11/11/2017 0637   Lab Results  Component Value Date   WBC 15.8 (H) 07/13/2022   NEUTROABS 13.8 (H) 07/11/2022   HGB  11.7 (L) 07/13/2022   HCT 36.8 07/13/2022   MCV 95.3 07/13/2022   PLT 214 07/13/2022    Imaging:  [facility] Clinician Interpretation: I have personally reviewed the CNS images as listed.  My interpretation, in the context of the patient's clinical presentation, is stable disease  MR BRAIN W WO CONTRAST  Result Date: 08/10/2022 CLINICAL DATA:  Brain/CNS neoplasm. Assess treatment response. High-grade glioma post resection. EXAM: MRI HEAD WITHOUT AND WITH CONTRAST TECHNIQUE: Multiplanar, multiecho pulse sequences of the brain and surrounding structures were obtained without and with intravenous contrast. CONTRAST:  14m GADAVIST GADOBUTROL 1 MMOL/ML IV SOLN COMPARISON:  Head CT 07/11/2022.  MRI 07/08/2022.  MRI 03/30/2022. FINDINGS: Brain: Considerable improvement since the previous examination. Marked reduction in edema and mass effect, with resolution of previously seen right-to-left midline shift. The amount of enhancing tissue in the right temporal to parietal region is markedly reduced, overall measurements being 6.6 x 2.4 x 3.1 cm today compared with 6.9 x 2.8 x 3.3 cm previously. Thickness of the tissue is reduced by approximately 50% in general. Near complete resolution of ependymal enhancement of the right temporal horn, with only a small amount of residual enhancement in the anterior aspect. No progressive or worsening finding. Otherwise, chronic small-vessel ischemic change of the hemispheric white matter appears stable. No evidence of recent infarction. No hydrocephalus. No extra-axial collection. Vascular: Major vessels at the base of the brain show flow. Skull and upper cervical spine: Postsurgical changes on the right. No change or new finding. Sinuses/Orbits: Clear/normal Other: Bilateral mastoid effusions, left more than right, similar to the prior study. Some improvement on the right. IMPRESSION: Considerable improvement since the previous study. Marked reduction in edema and mass effect, with  resolution of previously seen right-to-left midline shift. The amount of enhancing tissue in the right temporal to parietal region is markedly reduced, overall measurements being 6.6 x 2.4 x 3.1 cm today compared with 6.9 x 2.8 x 3.3 cm previously. Thickness of the tissue is reduced by approximately 50% in general. Near complete resolution of ependymal enhancement of the right temporal horn, with only a small amount of residual enhancement in the anterior aspect. No progressive or worsening finding. Electronically Signed   By: MNelson ChimesM.D.   On:  08/10/2022 15:30    Assessment/Plan 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 ([place])  Goals of care, counseling/discussion  Lauren Hill is clinically improved, now having completed 3 weeks of IMRT without chemotherapy.  MRI brain demonstrates stable findings compared to intra-RT study showing severe inflammation.  Today we reviewed goals of care, risks and benefits of potentially transitioning to adjuvant chemotherapy.  At this time, she will defer chemo and maintain imaging surveillance.  We recommended decreasing decadron by '1mg'$  each week if tolerated.  Will start with '3mg'$  daily tomorrow.  Will con't Keppra '500mg'$  BID as prior.  We ask that Lauren Hill return to clinic in 2 months with brain MRI for evaluation, or sooner as needed.  All questions were answered. The patient knows to call the clinic with any problems, questions or concerns. No barriers to learning were detected.  The total time spent in the encounter was 40 minutes and more than 50% was on counseling and review of test results   Ventura Sellers, MD Medical Director of Neuro-Oncology [facility] at [place] 08/17/22 10:47 AM

## 2022-08-18 ENCOUNTER — Telehealth: Payer: Self-pay | Admitting: Internal Medicine

## 2022-08-18 NOTE — Telephone Encounter (Signed)
Per 3/12 LOS reached out to patient to schedule, spoke with Patients daughter, confirmed time and date.

## 2022-08-24 ENCOUNTER — Encounter: Payer: Self-pay | Admitting: Internal Medicine

## 2022-08-24 DIAGNOSIS — Z9181 History of falling: Secondary | ICD-10-CM | POA: Diagnosis not present

## 2022-08-24 DIAGNOSIS — M5030 Other cervical disc degeneration, unspecified cervical region: Secondary | ICD-10-CM | POA: Diagnosis not present

## 2022-08-24 DIAGNOSIS — T796XXD Traumatic ischemia of muscle, subsequent encounter: Secondary | ICD-10-CM | POA: Diagnosis not present

## 2022-08-24 DIAGNOSIS — C712 Malignant neoplasm of temporal lobe: Secondary | ICD-10-CM | POA: Diagnosis not present

## 2022-08-24 DIAGNOSIS — I10 Essential (primary) hypertension: Secondary | ICD-10-CM | POA: Diagnosis not present

## 2022-08-24 DIAGNOSIS — E785 Hyperlipidemia, unspecified: Secondary | ICD-10-CM | POA: Diagnosis not present

## 2022-08-24 DIAGNOSIS — Z96643 Presence of artificial hip joint, bilateral: Secondary | ICD-10-CM | POA: Diagnosis not present

## 2022-08-24 DIAGNOSIS — D72829 Elevated white blood cell count, unspecified: Secondary | ICD-10-CM | POA: Diagnosis not present

## 2022-08-24 DIAGNOSIS — Z7952 Long term (current) use of systemic steroids: Secondary | ICD-10-CM | POA: Diagnosis not present

## 2022-08-24 DIAGNOSIS — K219 Gastro-esophageal reflux disease without esophagitis: Secondary | ICD-10-CM | POA: Diagnosis not present

## 2022-08-24 MED ORDER — DEXAMETHASONE 2 MG PO TABS: 2.0000 mg | ORAL_TABLET | Freq: Every day | ORAL | 1 refills | Status: DC

## 2022-08-27 ENCOUNTER — Telehealth: Payer: Self-pay

## 2022-08-27 NOTE — Telephone Encounter (Signed)
LM for [place] with Dr Renda Rolls recommendations to continue 2 mg daily

## 2022-08-27 NOTE — Telephone Encounter (Signed)
T/C from pt's daughter, Shirlean Mylar, wanting to confirm her dose of decadron.  She is currently on 2 mg daily.  She was on 3 mg when she fell so she is not sure if she needs to continue on the 2 mg daily dose.  Please advise

## 2022-09-06 ENCOUNTER — Other Ambulatory Visit: Payer: Self-pay | Admitting: Internal Medicine

## 2022-09-06 ENCOUNTER — Telehealth: Payer: Self-pay | Admitting: *Deleted

## 2022-09-06 NOTE — Telephone Encounter (Signed)
PC to patient's daughter, Jan, informed her of Dr Renda Rolls advice as below, no changes in tx plan, continue to supervise fluid & food intake.  Suggested patient may need to see her PCP.  Daughter verbalizes understanding.

## 2022-09-06 NOTE — Telephone Encounter (Signed)
-----   Message from Ventura Sellers, MD sent at 09/06/2022 12:02 PM EDT ----- No change in treatment plan, continue supportive care and hydration.   ----- Message ----- From: Rolene Course, RN Sent: 09/06/2022  11:46 AM EDT To: Ventura Sellers, MD  Her daughter (Jan) called today, she had a stomach virus around 3/23 & 3/24 for 24 hours.  Jan reports that she no longer has the GI sx's but she doesn't want to get out of bed or go downstairs for meals, her energy level is very low which is a significant change for her.  Jan reports she is eating & drinking adequately.  She is still taking her prednisone, 2 mg daily.  Please advise.

## 2022-09-10 DIAGNOSIS — G40909 Epilepsy, unspecified, not intractable, without status epilepticus: Secondary | ICD-10-CM | POA: Diagnosis not present

## 2022-09-10 DIAGNOSIS — A084 Viral intestinal infection, unspecified: Secondary | ICD-10-CM | POA: Diagnosis not present

## 2022-09-10 DIAGNOSIS — Z7189 Other specified counseling: Secondary | ICD-10-CM | POA: Diagnosis not present

## 2022-09-10 DIAGNOSIS — F329 Major depressive disorder, single episode, unspecified: Secondary | ICD-10-CM | POA: Diagnosis not present

## 2022-09-10 DIAGNOSIS — R531 Weakness: Secondary | ICD-10-CM | POA: Diagnosis not present

## 2022-09-10 DIAGNOSIS — N390 Urinary tract infection, site not specified: Secondary | ICD-10-CM | POA: Diagnosis not present

## 2022-09-10 DIAGNOSIS — C719 Malignant neoplasm of brain, unspecified: Secondary | ICD-10-CM | POA: Diagnosis not present

## 2022-09-14 DIAGNOSIS — C719 Malignant neoplasm of brain, unspecified: Secondary | ICD-10-CM | POA: Diagnosis not present

## 2022-09-15 DIAGNOSIS — E785 Hyperlipidemia, unspecified: Secondary | ICD-10-CM | POA: Diagnosis not present

## 2022-09-15 DIAGNOSIS — Z1331 Encounter for screening for depression: Secondary | ICD-10-CM | POA: Diagnosis not present

## 2022-09-15 DIAGNOSIS — C719 Malignant neoplasm of brain, unspecified: Secondary | ICD-10-CM | POA: Diagnosis not present

## 2022-09-15 DIAGNOSIS — Z1339 Encounter for screening examination for other mental health and behavioral disorders: Secondary | ICD-10-CM | POA: Diagnosis not present

## 2022-09-15 DIAGNOSIS — R5383 Other fatigue: Secondary | ICD-10-CM | POA: Diagnosis not present

## 2022-09-15 DIAGNOSIS — I10 Essential (primary) hypertension: Secondary | ICD-10-CM | POA: Diagnosis not present

## 2022-09-15 DIAGNOSIS — Z0000 Encounter for general adult medical examination without abnormal findings: Secondary | ICD-10-CM | POA: Diagnosis not present

## 2022-09-17 ENCOUNTER — Telehealth: Payer: Self-pay | Admitting: Internal Medicine

## 2022-10-04 ENCOUNTER — Ambulatory Visit (HOSPITAL_COMMUNITY)
Admission: RE | Admit: 2022-10-04 | Discharge: 2022-10-04 | Disposition: A | Discharge status: Home / Self Care | Payer: PPO | Source: Ambulatory Visit | Attending: Internal Medicine | Admitting: Internal Medicine

## 2022-10-04 DIAGNOSIS — C719 Malignant neoplasm of brain, unspecified: Secondary | ICD-10-CM | POA: Diagnosis not present

## 2022-10-04 MED ORDER — GADOBUTROL 1 MMOL/ML IV SOLN
7.5000 mL | Freq: Once | INTRAVENOUS | Status: AC | PRN
  Administered 2022-10-04: 7.5 mL via INTRAVENOUS

## 2022-10-05 ENCOUNTER — Other Ambulatory Visit (HOSPITAL_COMMUNITY): Payer: Self-pay | Admitting: Family Medicine

## 2022-10-05 ENCOUNTER — Other Ambulatory Visit (HOSPITAL_COMMUNITY): Payer: Self-pay

## 2022-10-05 ENCOUNTER — Encounter (HOSPITAL_COMMUNITY): Payer: Self-pay

## 2022-10-05 ENCOUNTER — Ambulatory Visit (HOSPITAL_BASED_OUTPATIENT_CLINIC_OR_DEPARTMENT_OTHER)
Admission: RE | Admit: 2022-10-05 | Discharge: 2022-10-05 | Disposition: A | Discharge status: Home / Self Care | Payer: PPO | Source: Ambulatory Visit | Attending: Vascular Surgery | Admitting: Vascular Surgery

## 2022-10-05 ENCOUNTER — Encounter: Payer: Self-pay | Admitting: Internal Medicine

## 2022-10-05 ENCOUNTER — Ambulatory Visit (HOSPITAL_COMMUNITY)
Admission: RE | Admit: 2022-10-05 | Discharge: 2022-10-05 | Disposition: A | Discharge status: Home / Self Care | Payer: PPO | Source: Ambulatory Visit | Attending: Vascular Surgery | Admitting: Vascular Surgery

## 2022-10-05 VITALS — BP 141/85 | HR 86

## 2022-10-05 DIAGNOSIS — C719 Malignant neoplasm of brain, unspecified: Secondary | ICD-10-CM | POA: Diagnosis not present

## 2022-10-05 DIAGNOSIS — R600 Localized edema: Secondary | ICD-10-CM | POA: Diagnosis not present

## 2022-10-05 DIAGNOSIS — I82412 Acute embolism and thrombosis of left femoral vein: Secondary | ICD-10-CM

## 2022-10-05 DIAGNOSIS — I82402 Acute embolism and thrombosis of unspecified deep veins of left lower extremity: Secondary | ICD-10-CM | POA: Diagnosis not present

## 2022-10-05 DIAGNOSIS — F329 Major depressive disorder, single episode, unspecified: Secondary | ICD-10-CM | POA: Diagnosis not present

## 2022-10-05 MED ORDER — APIXABAN (ELIQUIS) VTE STARTER PACK (10MG AND 5MG)
ORAL_TABLET | ORAL | 0 refills | Status: DC
  Filled 2022-10-05: qty 74, 30d supply, fill #0

## 2022-10-05 MED ORDER — APIXABAN 5 MG PO TABS: 5.0000 mg | ORAL_TABLET | Freq: Two times a day (BID) | ORAL | 4 refills | Status: DC

## 2022-10-05 NOTE — Progress Notes (Addendum)
DVT Clinic Note  Name: Lauren Hill     MRN: [id number]     DOB: 05/16/1935     Sex: female  PCP: Geoffry Paradise, MD  Today's Visit: Visit Information: Initial Visit  Referred to DVT Clinic by: Mcneil Sober, NP ([facility]/PCP's office)  Referred to CPP by: Dr. Edilia Bo Reason for referral:  Chief Complaint  Patient presents with   DVT   HISTORY OF PRESENT ILLNESS:  Lauren Hill is a 88 y.o. female with PMH HTN, HLD, malignant glioma diagnosed s/p craniotomy with resection 03/2022 now s/p radiation therapy 05/2022 with patient deciding to not pursue further chemotherapy, chronic leukocytosis, admission 04/2022 at [facility] for presumed bacterial meningitis, admission 07/2022 to [facility] for a fall at home, who presents accompanied by her sister and daughter after diagnosis of DVT for medication management. Patient's sister started noticing new swelling of the LLE 3 days ago. She was seen at her PCP's office today and sent for an ultrasound which found age-indeterminate DVT of the left common femoral vein and acute DVT involving the femoral vein, popliteal vein, posterior tibial veins, and peroneal veins. She was referred to the DVT Clinic to start treatment. Patient reports that she had a fall at home from the toilet last week but did not have any injuries. Reports that since she got sick mid March with a stomach virus she hasn't been active. This was a new change for her. She spent a lot of time in bed and continues to sit in her chair for majority of the day. She denies SOB, chest pain, palpitations. She is not experiencing any pain today, only swelling of the LLE.   Positive Thrombotic Risk Factors: Active cancer, Recent admission to hospital with acute illness (within 3 months), Older Age, Bed rest >72 hours within 3 month Bleeding Risk Factors: Age >65 years, Frequent falls, Cancer  Negative Thrombotic Risk Factors: Previous VTE, Recent surgery (within 3 months),  Recent trauma (within 3 months), Recent cesarean section (within 3 months), Obesity, Smoking, Pregnancy, Central venous catheterization, Sedentary journey lasting >8 hours within 4 weeks, Estrogen therapy, Testosterone therapy, Recent COVID diagnosis (within 3 months), Known thrombophilic condition, Non-malignant, chronic inflammatory condition, Within 6 weeks postpartum, Erythropoiesis-stimulating agent  Rx Insurance Coverage: Medicare Rx Affordability: Eliquis is $47/month. Filled starter pack today at [facility] during the visit for $0 using one time free savings card.  Preferred Pharmacy: Refills have been sent to [facility]  Past Medical History:  Diagnosis Date   Arthritis    GERD (gastroesophageal reflux disease)    indigestion   History of blood transfusion    as an infant   History of hiatal hernia    History of pneumonia    Hyperlipidemia    Hypertension    Renal cyst     Past Surgical History:  Procedure Laterality Date   APPLICATION OF CRANIAL NAVIGATION Right 03/29/2022   Procedure: APPLICATION OF CRANIAL NAVIGATION;  Surgeon: Tressie Stalker, MD;  Location: [facility] OR;  Service: Neurosurgery;  Laterality: Right;   COLONOSCOPY     CRANIOTOMY Right 03/29/2022   Procedure: CRANIOTOMY TUMOR EXCISION;  Surgeon: Tressie Stalker, MD;  Location: [facility] OR;  Service: Neurosurgery;  Laterality: Right;   DILATION AND CURETTAGE OF UTERUS     x2   EYE SURGERY     cataracts   HIP SURGERY  5/05   right hip replacement   HIP SURGERY  5/11   left hip replacement  ORIF HUMERUS FRACTURE Left 11/11/2017   Procedure: OPEN REDUCTION INTERNAL FIXATION (ORIF) LEFT DISTAL HUMERUS FRACTURE;  Surgeon: Roby Lofts, MD;  Location: [facility] OR;  Service: Orthopedics;  Laterality: Left;   RHINOPLASTY     TONSILLECTOMY     TOTAL SHOULDER ARTHROPLASTY Left 04/01/2016   Procedure: LEFT TOTAL SHOULDER ARTHROPLASTY;  Surgeon: Francena Hanly, MD;  Location: [facility] OR;  Service:  Orthopedics;  Laterality: Left;    Social History   Socioeconomic History   Marital status: Widowed    Spouse name: Not on file   Number of children: Not on file   Years of education: Not on file   Highest education level: Not on file  Occupational History   Not on file  Tobacco Use   Smoking status: Never   Smokeless tobacco: Never  Vaping Use   Vaping Use: Never used  Substance and Sexual Activity   Alcohol use: No   Drug use: No   Sexual activity: Not Currently  Other Topics Concern   Not on file  Social History Narrative   Not on file   Social Determinants of Health   Financial Resource Strain: Low Risk  (04/12/2022)   Overall Financial Resource Strain (CARDIA)    Difficulty of Paying Living Expenses: Not hard at all  Food Insecurity: No Food Insecurity (07/11/2022)   Hunger Vital Sign    Worried About Running Out of Food in the Last Year: Never true    Ran Out of Food in the Last Year: Never true  Transportation Needs: No Transportation Needs (07/11/2022)   PRAPARE - Administrator, Civil Service (Medical): No    Lack of Transportation (Non-Medical): No  Physical Activity: Not on file  Stress: Not on file  Social Connections: Not on file  Intimate Partner Violence: Not At Risk (07/11/2022)   Humiliation, Afraid, Rape, and Kick questionnaire    Fear of Current or Ex-Partner: No    Emotionally Abused: No    Physically Abused: No    Sexually Abused: No    Family History  Problem Relation Age of Onset   Heart disease Mother     Allergies as of 10/05/2022   (No Known Allergies)    Current Outpatient Medications on File Prior to Encounter  Medication Sig Dispense Refill   acetaminophen (TYLENOL) 500 MG tablet Take 500 mg by mouth every 6 (six) hours as needed for moderate pain or mild pain.     amLODipine (NORVASC) 5 MG tablet Take 5 mg by mouth 2 (two) times daily.      atenolol (TENORMIN) 50 MG tablet Take 50 mg by mouth at bedtime.     atorvastatin  (LIPITOR) 10 MG tablet Take 10 mg by mouth daily at 6 PM.      cholecalciferol (VITAMIN D3) 25 MCG (1000 UNIT) tablet Take 1,000 Units by mouth daily.     dexamethasone (DECADRON) 2 MG tablet Take 1 tablet (2 mg total) by mouth daily. 60 tablet 1   escitalopram (LEXAPRO) 10 MG tablet Take 10 mg by mouth daily.     levETIRAcetam (KEPPRA) 500 MG tablet TAKE ONE TABLET BY MOUTH TWICE DAILY 60 tablet 2   Potassium 99 MG TABS Take 99 mg by mouth at bedtime.     Probiotic Product (PROBIOTIC PO) Take 1 capsule by mouth daily.     No current facility-administered medications on file prior to encounter.   REVIEW OF SYSTEMS:  Review of Systems  Respiratory:  Negative for shortness  of breath.   Cardiovascular:  Positive for leg swelling. Negative for chest pain and palpitations.  Musculoskeletal:  Negative for myalgias.  Neurological:  Negative for dizziness and tingling.   PHYSICAL EXAMINATION:  Vitals:   10/05/22 1528  BP: (!) 141/85  Pulse: 86  SpO2: 97%   Physical Exam Vitals reviewed.  Cardiovascular:     Rate and Rhythm: Normal rate.  Pulmonary:     Effort: Pulmonary effort is normal.  Musculoskeletal:        General: No tenderness.     Right lower leg: No edema.     Left lower leg: Edema (2+ pitting edema) present.  Skin:    Findings: No erythema.  Psychiatric:        Mood and Affect: Mood normal.        Behavior: Behavior normal.        Thought Content: Thought content normal.   Villalta Score for Post-Thrombotic Syndrome: Pain: Absent Cramps: Absent Heaviness: Absent Paresthesia: Absent Pruritus: Absent Pretibial Edema: Moderate Skin Induration: Absent Hyperpigmentation: Absent Redness: Absent Venous Ectasia: Mild Pain on calf compression: Absent Villalta Preliminary Score: 3 Is venous ulcer present?: No If venous ulcer is present and score is <15, then 15 points total are assigned: Absent Villalta Total Score: 3  LABS:  CBC     Component Value Date/Time    WBC 15.8 (H) 07/13/2022 0435   RBC 3.86 (L) 07/13/2022 0435   HGB 11.7 (L) 07/13/2022 0435   HCT 36.8 07/13/2022 0435   PLT 214 07/13/2022 0435   MCV 95.3 07/13/2022 0435   MCH 30.3 07/13/2022 0435   MCHC 31.8 07/13/2022 0435   RDW 13.4 07/13/2022 0435   LYMPHSABS 0.8 07/11/2022 1432   MONOABS 0.8 07/11/2022 1432   EOSABS 0.0 07/11/2022 1432   BASOSABS 0.0 07/11/2022 1432    Hepatic Function      Component Value Date/Time   PROT 7.6 07/11/2022 1432   ALBUMIN 4.0 07/11/2022 1432   AST 40 07/11/2022 1432   ALT 22 07/11/2022 1432   ALKPHOS 58 07/11/2022 1432   BILITOT 0.9 07/11/2022 1432    Renal Function   Lab Results  Component Value Date   CREATININE 0.65 07/13/2022   CREATININE 0.68 07/12/2022   CREATININE 0.84 07/11/2022    CrCl cannot be calculated (Patient's most recent lab result is older than the maximum 21 days allowed.).   VVS Vascular Lab Studies:  10/05/22 VAS [place] LOWER EXTREMITY VENOUS LEFT (DVT)  Summary:  RIGHT:  - No evidence of common femoral vein obstruction.    LEFT:  - Findings consistent with acute deep vein thrombosis involving the  femoral vein, popliteal vein, posterior tibial veins, and peroneal veins.    - Findings consistent with age indeterminate deep vein thrombosis  involving the left common femoral vein.    - The external iliac vein appears patent and compressible; however, there  are areas of limited visualization due to bowel gas. Doppler flow is  continuous.  The areas of the common iliac vein observed are patent.   ASSESSMENT: Location of DVT: Left common femoral vein, Left femoral vein, Left popliteal vein, Left distal vein Cause of DVT: provoked by a persistent risk factor - active cancer, immobility  Discussed patient with Dr. Edilia Bo. No need for intervention from a vascular surgery standpoint. Will start Eliquis today. General guidance is to continue anticoagulation as long as a persistent risk factor is present, in this case  cancer and immobility. Given patient's recent history of  falls, age, and cancer which can increase risk of bleeding, would treat for at least 3-6 months and defer risk/benefit and goals of care discussions to her oncologist to determine if continuing anticoagulation while provoking risk factors persist is the right decision for this patient.  PLAN: -Start apixaban (Eliquis) 10 mg twice daily for 7 days followed by 5 mg twice daily. -Expected duration of therapy: per hematology/oncology. Therapy started on 10/05/22. -Patient educated on purpose, proper use and potential adverse effects of apixaban (Eliquis). -Discussed importance of taking medication around the same time every day. -Advised patient of medications to avoid (NSAIDs, aspirin doses >100 mg daily). -Educated that Tylenol (acetaminophen) is the preferred analgesic to lower the risk of bleeding. -Advised patient to alert all providers of anticoagulation therapy prior to starting a new medication or having a procedure. -Emphasized importance of monitoring for signs and symptoms of bleeding (abnormal bruising, prolonged bleeding, nose bleeds, bleeding from gums, discolored urine, black tarry stools). -Educated patient to present to the ED if emergent signs and symptoms of new thrombosis occur. -Counseled patient to wear compression stockings daily, removing at night. -Counseled patient on reducing risk of falls.   Follow up: with oncology on 10/12/22. With DVT Clinic as needed.   Pervis Hocking, PharmD, Center, CPP Deep Vein Thrombosis Clinic Clinical Pharmacist Practitioner Office: [phone]  I have evaluated the patient's chart/imaging and refer this patient to the Clinical Pharmacist Practitioner for medication management. I have reviewed the CPP's documentation and agree with her assessment and plan. I was immediately available during the visit for questions and collaboration.   Waverly Ferrari, MD

## 2022-10-05 NOTE — Patient Instructions (Signed)
-  Start apixaban (Eliquis) 10 mg twice daily for 7 days followed by 5 mg twice daily. -Your refills have been sent to [facility]. You may need to call the pharmacy to ask them to fill this when you start to run low on your current supply.  -It is important to take your medication around the same time every day.  -Avoid NSAIDs like ibuprofen (Advil, Motrin) and naproxen (Aleve) as well as aspirin doses over 100 mg daily. -Tylenol (acetaminophen) is the preferred over the counter pain medication to lower the risk of bleeding. -Be sure to alert all of your health care providers that you are taking an anticoagulant prior to starting a new medication or having a procedure. -Monitor for signs and symptoms of bleeding (abnormal bruising, prolonged bleeding, nose bleeds, bleeding from gums, discolored urine, black tarry stools). If you have fallen and hit your head OR if your bleeding is severe or not stopping, seek emergency care.  -Go to the emergency room if emergent signs and symptoms of new clot occur (new or worse swelling and pain in an arm or leg, shortness of breath, chest pain, fast or irregular heartbeats, lightheadedness, dizziness, fainting, coughing up blood) or if you experience a significant color change (pale or blue) in the extremity that has the DVT.  -We recommend you wear compression stockings as long as you are having swelling or pain. Be sure to purchase the correct size and take them off at night.   Follow up with oncology. Please reach out to the DVT Clinic as needed.   If you have any questions or need to reschedule an appointment, please call [phone] [facility].  If you are having an emergency, call 911 or present to the nearest emergency room.   What is a DVT?  -Deep vein thrombosis (DVT) is a condition in which a blood clot forms in a vein of the deep venous system which can occur in the lower leg, thigh, pelvis, arm, or neck. This condition is serious and can be  life-threatening if the clot travels to the arteries of the lungs and causing a blockage (pulmonary embolism, PE). A DVT can also damage veins in the leg, which can lead to long-term venous disease, leg pain, swelling, discoloration, and ulcers or sores (post-thrombotic syndrome).  -Treatment may include taking an anticoagulant medication to prevent more clots from forming and the current clot from growing, wearing compression stockings, and/or surgical procedures to remove or dissolve the clot.

## 2022-10-06 ENCOUNTER — Encounter (HOSPITAL_COMMUNITY): Payer: PPO

## 2022-10-12 ENCOUNTER — Encounter (HOSPITAL_COMMUNITY): Payer: Self-pay

## 2022-10-12 ENCOUNTER — Inpatient Hospital Stay: Payer: PPO | Admitting: Internal Medicine

## 2022-10-12 ENCOUNTER — Emergency Department (HOSPITAL_COMMUNITY): Payer: PPO

## 2022-10-12 ENCOUNTER — Ambulatory Visit: Payer: PPO | Admitting: Internal Medicine

## 2022-10-12 ENCOUNTER — Other Ambulatory Visit: Payer: Self-pay

## 2022-10-12 ENCOUNTER — Telehealth: Payer: Self-pay | Admitting: *Deleted

## 2022-10-12 ENCOUNTER — Emergency Department (HOSPITAL_COMMUNITY)
Admission: EM | Admit: 2022-10-12 | Discharge: 2022-10-12 | Disposition: A | Discharge status: Home / Self Care | Payer: PPO | Attending: Emergency Medicine | Admitting: Emergency Medicine

## 2022-10-12 DIAGNOSIS — R001 Bradycardia, unspecified: Secondary | ICD-10-CM | POA: Diagnosis not present

## 2022-10-12 DIAGNOSIS — Z79899 Other long term (current) drug therapy: Secondary | ICD-10-CM | POA: Diagnosis not present

## 2022-10-12 DIAGNOSIS — R4182 Altered mental status, unspecified: Secondary | ICD-10-CM | POA: Diagnosis not present

## 2022-10-12 DIAGNOSIS — R55 Syncope and collapse: Secondary | ICD-10-CM | POA: Diagnosis not present

## 2022-10-12 DIAGNOSIS — R0902 Hypoxemia: Secondary | ICD-10-CM | POA: Diagnosis not present

## 2022-10-12 DIAGNOSIS — Z7901 Long term (current) use of anticoagulants: Secondary | ICD-10-CM | POA: Insufficient documentation

## 2022-10-12 DIAGNOSIS — R600 Localized edema: Secondary | ICD-10-CM | POA: Insufficient documentation

## 2022-10-12 DIAGNOSIS — I10 Essential (primary) hypertension: Secondary | ICD-10-CM | POA: Insufficient documentation

## 2022-10-12 DIAGNOSIS — R197 Diarrhea, unspecified: Secondary | ICD-10-CM | POA: Diagnosis not present

## 2022-10-12 DIAGNOSIS — C719 Malignant neoplasm of brain, unspecified: Secondary | ICD-10-CM | POA: Insufficient documentation

## 2022-10-12 LAB — CBC
HCT: 45.3 % (ref 36.0–46.0)
Hemoglobin: 14.9 g/dL (ref 12.0–15.0)
MCH: 30.8 pg (ref 26.0–34.0)
MCHC: 32.9 g/dL (ref 30.0–36.0)
MCV: 93.8 fL (ref 80.0–100.0)
Platelets: 224 10*3/uL (ref 150–400)
RBC: 4.83 MIL/uL (ref 3.87–5.11)
RDW: 14.2 % (ref 11.5–15.5)
WBC: 15.1 10*3/uL — ABNORMAL HIGH (ref 4.0–10.5)
nRBC: 0 % (ref 0.0–0.2)

## 2022-10-12 LAB — COMPREHENSIVE METABOLIC PANEL
ALT: 37 U/L (ref 0–44)
AST: 32 U/L (ref 15–41)
Albumin: 3.3 g/dL — ABNORMAL LOW (ref 3.5–5.0)
Alkaline Phosphatase: 51 U/L (ref 38–126)
Anion gap: 11 (ref 5–15)
BUN: 23 mg/dL (ref 8–23)
CO2: 20 mmol/L — ABNORMAL LOW (ref 22–32)
Calcium: 8.7 mg/dL — ABNORMAL LOW (ref 8.9–10.3)
Chloride: 102 mmol/L (ref 98–111)
Creatinine, Ser: 0.88 mg/dL (ref 0.44–1.00)
GFR, Estimated: >60 mL/min (ref >60)
Glucose, Bld: 172 mg/dL — ABNORMAL HIGH (ref 70–99)
Potassium: 3.2 mmol/L — ABNORMAL LOW (ref 3.5–5.1)
Sodium: 133 mmol/L — ABNORMAL LOW (ref 135–145)
Total Bilirubin: 0.9 mg/dL (ref 0.3–1.2)
Total Protein: 6.1 g/dL — ABNORMAL LOW (ref 6.5–8.1)

## 2022-10-12 LAB — TROPONIN I (HIGH SENSITIVITY): Troponin I (High Sensitivity): 14 ng/L (ref <18)

## 2022-10-12 MED ORDER — IOHEXOL 350 MG/ML SOLN
75.0000 mL | Freq: Once | INTRAVENOUS | Status: AC | PRN
  Administered 2022-10-12: 75 mL via INTRAVENOUS

## 2022-10-12 MED ORDER — SODIUM CHLORIDE (PF) 0.9 % IJ SOLN
INTRAMUSCULAR | Status: AC
  Filled 2022-10-12: qty 50

## 2022-10-12 NOTE — ED Triage Notes (Signed)
Syncope episode while on the toilet this morning denies fall or head trauma BIB [facility] from [facility]. Started Eliquis less than week ago, recent brain tumor surgery with f/u 2pm today at this hospital. Client is at baseline [facility]G 207

## 2022-10-12 NOTE — ED Provider Notes (Signed)
[facility] AT [facility] Provider Note   CSN: [id number] Arrival date & time: 10/12/22  1054     History  Chief Complaint  Patient presents with   Loss of Consciousness    Lauren Hill is a 88 y.o. female.   Loss of Consciousness Patient presents after loss of consciousness.  Reportedly was on the toilet and then later woke up with EMS there.  Started on Eliquis for DVT.  Has also known brain tumor.  Due to follow-up with Dr. Barbaraann Cao at 2 PM today.  States she has passed out numerous times in the past.  Denies seizure activity.  Did not fall off the toilet.  Has however states she has been more fatigued since starting the Eliquis for the DVT.  No chest pain.    Past Medical History:  Diagnosis Date   Arthritis    GERD (gastroesophageal reflux disease)    indigestion   History of blood transfusion    as an infant   History of hiatal hernia    History of pneumonia    Hyperlipidemia    Hypertension    Renal cyst     Home Medications Prior to Admission medications   Medication Sig Start Date End Date Taking? Authorizing Provider  acetaminophen (TYLENOL) 500 MG tablet Take 500 mg by mouth every 6 (six) hours as needed for moderate pain or mild pain.    [provider]  amLODipine (NORVASC) 5 MG tablet Take 5 mg by mouth 2 (two) times daily.  01/12/16   [provider]  apixaban (ELIQUIS) 5 MG TABS tablet Take 1 tablet (5 mg total) by mouth 2 (two) times daily. Start taking after completion of starter pack. 10/05/22   Pervis Hocking B, RPH-CPP  APIXABAN Everlene Balls) VTE STARTER PACK (10MG  AND 5MG ) Take as directed on package: start with two-5mg  tablets twice daily for 7 days. On day 8, switch to one-5mg  tablet twice daily. 10/05/22   Pervis Hocking B, RPH-CPP  atenolol (TENORMIN) 50 MG tablet Take 50 mg by mouth at bedtime.    [provider]  atorvastatin (LIPITOR) 10 MG tablet Take 10 mg by mouth daily at 6 PM.      [provider]  cholecalciferol (VITAMIN D3) 25 MCG (1000 UNIT) tablet Take 1,000 Units by mouth daily.    [provider]  dexamethasone (DECADRON) 2 MG tablet Take 1 tablet (2 mg total) by mouth daily. 08/24/22   Henreitta Leber, MD  escitalopram (LEXAPRO) 10 MG tablet Take 10 mg by mouth daily. 10/05/22   [provider]  levETIRAcetam (KEPPRA) 500 MG tablet TAKE ONE TABLET BY MOUTH TWICE DAILY 09/06/22   Henreitta Leber, MD  Potassium 99 MG TABS Take 99 mg by mouth at bedtime.    [provider]  Probiotic Product (PROBIOTIC PO) Take 1 capsule by mouth daily.    [provider]      Allergies    Patient has no known allergies.    Review of Systems   Review of Systems  Cardiovascular:  Positive for syncope.    Physical Exam Updated Vital Signs BP 133/71   Pulse 79   Temp 98.2 F (36.8 C)   Resp (!) 21   LMP 06/08/1983   SpO2 94%  Physical Exam Vitals and nursing note reviewed.  Eyes:     Pupils: Pupils are equal, round, and reactive to light.  Cardiovascular:     Rate and Rhythm: Normal rate.  Pulmonary:     Breath sounds: No wheezing.  Abdominal:     Tenderness: There is no abdominal tenderness.  Musculoskeletal:        General: Swelling present.     Left lower leg: Edema present.  Skin:    General: Skin is warm.     Capillary Refill: Capillary refill takes less than 2 seconds.  Neurological:     Mental Status: She is alert and oriented to person, place, and time.     ED Results / Procedures / Treatments   Labs (all labs ordered are listed, but only abnormal results are displayed) Labs Reviewed  COMPREHENSIVE METABOLIC PANEL - Abnormal; Notable for the following components:      Result Value   Sodium 133 (*)    Potassium 3.2 (*)    CO2 20 (*)    Glucose, Bld 172 (*)    Calcium 8.7 (*)    Total Protein 6.1 (*)    Albumin 3.3 (*)    All other components within normal limits  CBC - Abnormal; Notable for the  following components:   WBC 15.1 (*)    All other components within normal limits  TROPONIN I (HIGH SENSITIVITY)    EKG EKG Interpretation  Date/Time:  Tuesday Oct 12 2022 11:04:43 EDT Ventricular Rate:  68 PR Interval:  207 QRS Duration: 102 QT Interval:  402 QTC Calculation: 428 R Axis:   -11 Text Interpretation: Age not entered, assumed to be  88 years old for purpose of ECG interpretation Sinus rhythm Atrial premature complex Borderline prolonged PR interval Consider anterior infarct Confirmed by Benjiman Core [phone]) on 10/12/2022 2:34:58 PM  Radiology CT Angio Chest PE W and/or Wo Contrast  Result Date: 10/12/2022 CLINICAL DATA:  Glioblastoma. Probability of pulmonary embolism clinically. Syncopal event. EXAM: CT ANGIOGRAPHY CHEST WITH CONTRAST TECHNIQUE: Multidetector CT imaging of the chest was performed using the standard protocol during bolus administration of intravenous contrast. Multiplanar CT image reconstructions and MIPs were obtained to evaluate the vascular anatomy. RADIATION DOSE REDUCTION: This exam was performed according to the departmental dose-optimization program which includes automated exposure control, adjustment of the mA and/or kV according to patient size and/or use of iterative reconstruction technique. CONTRAST:  75mL OMNIPAQUE IOHEXOL 350 MG/ML SOLN COMPARISON:  Chest radiography 07/11/2022.  CT 03/25/2022. FINDINGS: Cardiovascular: Mild cardiomegaly. Mild coronary artery calcification. Ordinary moderate aortic atherosclerotic calcification. Pulmonary arterial opacification is good. There are no pulmonary emboli. Mediastinum/Nodes: No mediastinal or hilar mass or lymphadenopathy. Lungs/Pleura: Upper lobes are clear. Mild scarring and or volume loss at both lung bases. No evidence of consolidation or lobar collapse. No pleural effusion. Upper Abdomen: Negative other than gallstones. No CT evidence of cholecystitis or obstruction. Musculoskeletal: Ordinary spinal  degenerative changes. Pronounced sclerotic change of the marrow in the lower thoracic region. This is presumed degenerative. Review of the MIP images confirms the above findings. IMPRESSION: 1. No pulmonary emboli. 2. Mild cardiomegaly. Mild coronary artery calcification. Aortic atherosclerotic calcification. 3. Mild scarring and or volume loss at both lung bases. No evidence of consolidation or lobar collapse. 4. Cholelithiasis without CT evidence of cholecystitis or obstruction. Aortic Atherosclerosis (ICD10-I70.0). Electronically Signed   By: Paulina Fusi M.D.   On: 10/12/2022 13:47   CT HEAD WO CONTRAST ( )  Result Date: 10/12/2022 CLINICAL DATA:  Mental status change of unknown cause. History of glioblastoma with prior surgery and radiation EXAM: CT HEAD WITHOUT CONTRAST TECHNIQUE: Contiguous axial images were obtained from the base of the skull through  the vertex without intravenous contrast. RADIATION DOSE REDUCTION: This exam was performed according to the departmental dose-optimization program which includes automated exposure control, adjustment of the mA and/or kV according to patient size and/or use of iterative reconstruction technique. COMPARISON:  MRI 10/04/2022.  CT 07/11/2022 FINDINGS: Brain: No focal abnormality affects the brainstem or cerebellum. Left cerebral hemisphere shows chronic low-density affecting white matter. On the right, there has been previous craniotomy and resection in right temporal region. Chronic volume loss in that region with chronic low-density in the white matter is unchanged. Focal calcification is unchanged. No evidence of progressive edema or any mass effect. No hydrocephalus or extra-axial fluid collection. Vascular: There is atherosclerotic calcification of the major vessels at the base of the brain. Skull: Negative other than the right craniotomy changes. Sinuses/Orbits: Clear/normal Other: None IMPRESSION: 1. No acute CT finding. Previous craniotomy and  resection in the right temporal region. Chronic volume loss and low-density in the white matter of the right cerebral hemisphere, unchanged. No evidence of progressive edema or any mass effect. 2. Chronic low-density affecting the left cerebral hemisphere white matter, unchanged. Electronically Signed   By: Paulina Fusi M.D.   On: 10/12/2022 13:44    Procedures Procedures    Medications Ordered in ED Medications  iohexol (OMNIPAQUE) 350 MG/ML injection 75 mL (75 mLs Intravenous Contrast Given 10/12/22 1325)    ED Course/ Medical Decision Making/ A&P                             Medical Decision Making Amount and/or Complexity of Data Reviewed Labs: ordered. Radiology: ordered.  Risk Prescription drug management.     Initial hyperglycemia improved after oral intake, stable check.  Reported history of same but also has brain tumor and known to  Patient with syncope.  PT.  On blood thinners.  Will check basic blood work.  Differential diagnosis does include pulmonary embolism.  Also with known tumor will get head CT.  Is on blood thinners..  Seizure felt less likely.  Head CT reassuring minus the known tumor.  CT PE done to rule out PE with history of recent DVT.  It was also reassuring.  Blood work reassuring.  Discussed with Dr. Barbaraann Cao from neuro-oncology.  He saw patient in the ER.  Likely will go to hospice.  However does not need admission at this time.  Will discharge home.        Final Clinical Impression(s) / ED Diagnoses Final diagnoses:  Syncope, unspecified syncope type  Glioma [facility])    Rx / DC Orders ED Discharge Orders     None         Benjiman Core, MD 10/12/22 1547

## 2022-10-12 NOTE — Consult Note (Signed)
[facility] Neuro-Oncology Consult Note  Patient Care Team: Geoffry Paradise, MD as PCP - General (Internal Medicine) Pickenpack-Cousar, Arty Baumgartner, NP as Nurse Practitioner (Nurse Practitioner)  CHIEF COMPLAINTS/PURPOSE OF CONSULTATION:  Glioblastoma  HISTORY OF PRESENTING ILLNESS:  Lauren Hill 88 y.o. female presented with altered mentation, "passed out" this morning.  She may have lost consciousness briefly while using the bathroom, seated.  In the past several weeks, however, she had notable decline in energy level, cognition, short term memory.  Things "seem to be worse since the blood clot".  She is mostly in the bed during the day, sleeping much of the day and night.  No recent issues with urine, no headaches or frank seizures.  MEDICAL HISTORY:  Past Medical History:  Diagnosis Date   Arthritis    GERD (gastroesophageal reflux disease)    indigestion   History of blood transfusion    as an infant   History of hiatal hernia    History of pneumonia    Hyperlipidemia    Hypertension    Renal cyst     SURGICAL HISTORY: Past Surgical History:  Procedure Laterality Date   APPLICATION OF CRANIAL NAVIGATION Right 03/29/2022   Procedure: APPLICATION OF CRANIAL NAVIGATION;  Surgeon: Tressie Stalker, MD;  Location: [facility] OR;  Service: Neurosurgery;  Laterality: Right;   COLONOSCOPY     CRANIOTOMY Right 03/29/2022   Procedure: CRANIOTOMY TUMOR EXCISION;  Surgeon: Tressie Stalker, MD;  Location: [facility] OR;  Service: Neurosurgery;  Laterality: Right;   DILATION AND CURETTAGE OF UTERUS     x2   EYE SURGERY     cataracts   HIP SURGERY  5/05   right hip replacement   HIP SURGERY  5/11   left hip replacement   ORIF HUMERUS FRACTURE Left 11/11/2017   Procedure: OPEN REDUCTION INTERNAL FIXATION (ORIF) LEFT DISTAL HUMERUS FRACTURE;  Surgeon: Roby Lofts, MD;  Location: [facility] OR;  Service: Orthopedics;  Laterality: Left;   RHINOPLASTY     TONSILLECTOMY     TOTAL  SHOULDER ARTHROPLASTY Left 04/01/2016   Procedure: LEFT TOTAL SHOULDER ARTHROPLASTY;  Surgeon: Francena Hanly, MD;  Location: [facility] OR;  Service: Orthopedics;  Laterality: Left;    SOCIAL HISTORY: Social History   Socioeconomic History   Marital status: Widowed    Spouse name: Not on file   Number of children: Not on file   Years of education: Not on file   Highest education level: Not on file  Occupational History   Not on file  Tobacco Use   Smoking status: Never   Smokeless tobacco: Never  Vaping Use   Vaping Use: Never used  Substance and Sexual Activity   Alcohol use: No   Drug use: No   Sexual activity: Not Currently  Other Topics Concern   Not on file  Social History Narrative   Not on file   Social Determinants of Health   Financial Resource Strain: Low Risk  (04/12/2022)   Overall Financial Resource Strain (CARDIA)    Difficulty of Paying Living Expenses: Not hard at all  Food Insecurity: No Food Insecurity (07/11/2022)   Hunger Vital Sign    Worried About Running Out of Food in the Last Year: Never true    Ran Out of Food in the Last Year: Never true  Transportation Needs: No Transportation Needs (07/11/2022)   PRAPARE - Administrator, Civil Service (Medical): No    Lack of Transportation (Non-Medical): No  Physical Activity: Not  on file  Stress: Not on file  Social Connections: Not on file  Intimate Partner Violence: Not At Risk (07/11/2022)   Humiliation, Afraid, Rape, and Kick questionnaire    Fear of Current or Ex-Partner: No    Emotionally Abused: No    Physically Abused: No    Sexually Abused: No    FAMILY HISTORY: Family History  Problem Relation Age of Onset   Heart disease Mother     ALLERGIES:  has No Known Allergies.  MEDICATIONS:  No current facility-administered medications for this encounter.   Current Outpatient Medications  Medication Sig Dispense Refill   acetaminophen (TYLENOL) 500 MG tablet Take 500 mg by mouth every 6  (six) hours as needed for moderate pain or mild pain.     amLODipine (NORVASC) 5 MG tablet Take 5 mg by mouth 2 (two) times daily.      apixaban (ELIQUIS) 5 MG TABS tablet Take 1 tablet (5 mg total) by mouth 2 (two) times daily. Start taking after completion of starter pack. 60 tablet 4   APIXABAN (ELIQUIS) VTE STARTER PACK (10MG  AND 5MG ) Take as directed on package: start with two-5mg  tablets twice daily for 7 days. On day 8, switch to one-5mg  tablet twice daily. 74 each 0   atenolol (TENORMIN) 50 MG tablet Take 50 mg by mouth at bedtime.     atorvastatin (LIPITOR) 10 MG tablet Take 10 mg by mouth daily at 6 PM.      cholecalciferol (VITAMIN D3) 25 MCG (1000 UNIT) tablet Take 1,000 Units by mouth daily.     dexamethasone (DECADRON) 2 MG tablet Take 1 tablet (2 mg total) by mouth daily. 60 tablet 1   escitalopram (LEXAPRO) 10 MG tablet Take 10 mg by mouth daily.     levETIRAcetam (KEPPRA) 500 MG tablet TAKE ONE TABLET BY MOUTH TWICE DAILY 60 tablet 2   Potassium 99 MG TABS Take 99 mg by mouth at bedtime.     Probiotic Product (PROBIOTIC PO) Take 1 capsule by mouth daily.      REVIEW OF SYSTEMS:   Constitutional: Denies fevers, chills or abnormal weight loss Eyes: Denies blurriness of vision Ears, nose, mouth, throat, and face: Denies mucositis or sore throat Respiratory: Denies cough, dyspnea or wheezes Cardiovascular: Denies palpitation, chest discomfort or lower extremity swelling Gastrointestinal:  Denies nausea, constipation, diarrhea GU: Denies dysuria or incontinence Skin: Denies abnormal skin rashes Neurological: Per HPI Musculoskeletal: Denies joint pain, back or neck discomfort. No decrease in ROM Behavioral/Psych: Denies anxiety, disturbance in thought content, and mood instability   PHYSICAL EXAMINATION: Vitals:   10/12/22 1107 10/12/22 1130  BP:    Pulse: 69   Resp: (!) 21   Temp:  98.6 F (37 C)  SpO2: 96%    KPS: 60. General: Alert, cooperative, pleasant, in no  acute distress Head: Normal EENT: No conjunctival injection or scleral icterus. Oral mucosa moist Lungs: Resp effort normal Cardiac: Regular rate and rhythm Abdomen: Soft, non-distended abdomen Skin: No rashes cyanosis or petechiae. Extremities: No clubbing or edema  NEUROLOGIC EXAM: Mental Status: Awake, alert, attentive to examiner. Oriented to self and environment. Language is fluent with intact comprehension.  Impaired recall and processing speed. Cranial Nerves: Visual acuity is grossly normal. Visual fields are full. Extra-ocular movements intact. No ptosis. Face is symmetric, tongue midline. Motor: Tone and bulk are normal. Power is full in both arms and legs. Reflexes are symmetric, no pathologic reflexes present. Intact finger to nose bilaterally Sensory: Intact to light touch  and temperature Gait: Deferred   LABORATORY DATA:  I have reviewed the data as listed Lab Results  Component Value Date   WBC 15.1 (H) 10/12/2022   HGB 14.9 10/12/2022   HCT 45.3 10/12/2022   MCV 93.8 10/12/2022   PLT 224 10/12/2022   Recent Labs    07/11/22 1432 07/12/22 0450 07/13/22 0435 10/12/22 1131  NA 137 138 138 133*  K 3.8 3.7 4.1 3.2*  CL 103 107 110 102  CO2 24 21* 21* 20*  GLUCOSE 133* 141* 143* 172*  BUN 25* 19 22 23   CREATININE 0.84 0.68 0.65 0.88  CALCIUM 9.3 9.2 9.3 8.7*  GFRNONAA >60 >60 >60 >60  PROT 7.6  --   --  6.1*  ALBUMIN 4.0  --   --  3.3*  AST 40  --   --  32  ALT 22  --   --  37  ALKPHOS 58  --   --  51  BILITOT 0.9  --   --  0.9    RADIOGRAPHIC STUDIES: I have personally reviewed the radiological images as listed and agreed with the findings in the report. CT Angio Chest PE W and/or Wo Contrast  Result Date: 10/12/2022 CLINICAL DATA:  Glioblastoma. Probability of pulmonary embolism clinically. Syncopal event. EXAM: CT ANGIOGRAPHY CHEST WITH CONTRAST TECHNIQUE: Multidetector CT imaging of the chest was performed using the standard protocol during bolus  administration of intravenous contrast. Multiplanar CT image reconstructions and MIPs were obtained to evaluate the vascular anatomy. RADIATION DOSE REDUCTION: This exam was performed according to the departmental dose-optimization program which includes automated exposure control, adjustment of the mA and/or kV according to patient size and/or use of iterative reconstruction technique. CONTRAST:  75mL OMNIPAQUE IOHEXOL 350 MG/ML SOLN COMPARISON:  Chest radiography 07/11/2022.  CT 03/25/2022. FINDINGS: Cardiovascular: Mild cardiomegaly. Mild coronary artery calcification. Ordinary moderate aortic atherosclerotic calcification. Pulmonary arterial opacification is good. There are no pulmonary emboli. Mediastinum/Nodes: No mediastinal or hilar mass or lymphadenopathy. Lungs/Pleura: Upper lobes are clear. Mild scarring and or volume loss at both lung bases. No evidence of consolidation or lobar collapse. No pleural effusion. Upper Abdomen: Negative other than gallstones. No CT evidence of cholecystitis or obstruction. Musculoskeletal: Ordinary spinal degenerative changes. Pronounced sclerotic change of the marrow in the lower thoracic region. This is presumed degenerative. Review of the MIP images confirms the above findings. IMPRESSION: 1. No pulmonary emboli. 2. Mild cardiomegaly. Mild coronary artery calcification. Aortic atherosclerotic calcification. 3. Mild scarring and or volume loss at both lung bases. No evidence of consolidation or lobar collapse. 4. Cholelithiasis without CT evidence of cholecystitis or obstruction. Aortic Atherosclerosis (ICD10-I70.0). Electronically Signed   By: Paulina Fusi M.D.   On: 10/12/2022 13:47   CT HEAD WO CONTRAST ( )  Result Date: 10/12/2022 CLINICAL DATA:  Mental status change of unknown cause. History of glioblastoma with prior surgery and radiation EXAM: CT HEAD WITHOUT CONTRAST TECHNIQUE: Contiguous axial images were obtained from the base of the skull through the vertex  without intravenous contrast. RADIATION DOSE REDUCTION: This exam was performed according to the departmental dose-optimization program which includes automated exposure control, adjustment of the mA and/or kV according to patient size and/or use of iterative reconstruction technique. COMPARISON:  MRI 10/04/2022.  CT 07/11/2022 FINDINGS: Brain: No focal abnormality affects the brainstem or cerebellum. Left cerebral hemisphere shows chronic low-density affecting white matter. On the right, there has been previous craniotomy and resection in right temporal region. Chronic volume loss in that region  with chronic low-density in the white matter is unchanged. Focal calcification is unchanged. No evidence of progressive edema or any mass effect. No hydrocephalus or extra-axial fluid collection. Vascular: There is atherosclerotic calcification of the major vessels at the base of the brain. Skull: Negative other than the right craniotomy changes. Sinuses/Orbits: Clear/normal Other: None IMPRESSION: 1. No acute CT finding. Previous craniotomy and resection in the right temporal region. Chronic volume loss and low-density in the white matter of the right cerebral hemisphere, unchanged. No evidence of progressive edema or any mass effect. 2. Chronic low-density affecting the left cerebral hemisphere white matter, unchanged. Electronically Signed   By: Paulina Fusi M.D.   On: 10/12/2022 13:44   MR BRAIN W WO CONTRAST  Result Date: 10/07/2022 CLINICAL DATA:  Brain/CNS neoplasm, assess treatment response. High-grade glioma. EXAM: MRI HEAD WITHOUT AND WITH CONTRAST TECHNIQUE: Multiplanar, multiecho pulse sequences of the brain and surrounding structures were obtained without and with intravenous contrast. CONTRAST:  7.14mL GADAVIST GADOBUTROL 1 MMOL/ML IV SOLN COMPARISON:  Brain MRI 08/10/2022 and older. FINDINGS: Brain: Postoperative changes of right temporal craniotomy with underlying resection cavity in the right temporal  lobe. Overall unchanged size of the peripherally enhancing mass surrounding the resection cavity with interval collapse of some of the centrally necrotic components. Unchanged peripheral decreased diffusivity. No new areas of nodular enhancement. The surrounding region of T2 hyperintensity is unchanged. No acute infarct or hemorrhage. Unchanged moderate chronic small-vessel disease. No hydrocephalus or midline shift. Vascular: Normal flow voids and enhancement. Skull and upper cervical spine: Prior right temporal craniotomy, as above. Sinuses/Orbits: Unchanged left mastoid effusion. No nasopharyngeal mass. Other: None. IMPRESSION: Evolving posttreatment changes in the right temporal lobe without evidence of disease progression. Electronically Signed   By: Orvan Falconer M.D.   On: 10/07/2022 11:20   VAS [place] LOWER EXTREMITY VENOUS (DVT)  Result Date: 10/06/2022  Lower Venous DVT Study Patient Name:  Lauren Hill  Date of Exam:   10/05/2022 Medical Rec #: [id number]           Accession #:    [id number] Date of Birth: 10-03-1934          Patient Gender: F Patient Age:   19 years Exam Location:  [facility] Procedure:      VAS [place] LOWER EXTREMITY VENOUS (DVT) Referring Phys: LINDSAY LEE --------------------------------------------------------------------------------  Indications: Left leg edema x 3 days.  Performing Technologist: Dorthula Matas RVS, RCS  Examination Guidelines: A complete evaluation includes B-mode imaging, spectral Doppler, color Doppler, and power Doppler as needed of all accessible portions of each vessel. Bilateral testing is considered an integral part of a complete examination. Limited examinations for reoccurring indications may be performed as noted. The reflux portion of the exam is performed with the patient in reverse Trendelenburg.  +---------+---------------+---------+-----------+----------+-----------------+ LEFT      CompressibilityPhasicitySpontaneityPropertiesThrombus Aging    +---------+---------------+---------+-----------+----------+-----------------+ CFV      None                                         Age Indeterminate +---------+---------------+---------+-----------+----------+-----------------+ SFJ      Full                                                           +---------+---------------+---------+-----------+----------+-----------------+  FV Prox  None                                         Acute             +---------+---------------+---------+-----------+----------+-----------------+ FV Mid   None                                         Acute             +---------+---------------+---------+-----------+----------+-----------------+ FV DistalNone                                         Acute             +---------+---------------+---------+-----------+----------+-----------------+ POP      None                                         Acute             +---------+---------------+---------+-----------+----------+-----------------+ PTV      None                                         Acute             +---------+---------------+---------+-----------+----------+-----------------+ PERO     None                                         Acute             +---------+---------------+---------+-----------+----------+-----------------+ GSV      Full                                                           +---------+---------------+---------+-----------+----------+-----------------+ SSV      Full                                                           +---------+---------------+---------+-----------+----------+-----------------+    Findings reported to Rachel at 3:00 pm.  Summary: RIGHT: - No evidence of common femoral vein obstruction.  LEFT: - Findings consistent with acute deep vein thrombosis involving the femoral vein, popliteal vein,  posterior tibial veins, and peroneal veins.  - Findings consistent with age indeterminate deep vein thrombosis involving the left common femoral vein.  - The external iliac vein appears patent and compressible; however, there are areas of limited visualization due to bowel gas. Doppler flow is continuous. The areas of the common iliac vein observed are patent.  *See table(s) above for measurements and observations. Electronically signed by Lemar Livings MD on 10/06/2022 at 12:00:38  PM.    Final     ASSESSMENT & PLAN:  Glioblastoma  Lauren Hill presents today with gradual decline in functional status.  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  She is not on chemotherapy currently.  We extensively discussed goals of care at bedside today.  She is interested in hospice, but would like to take a few days to discuss it with her family.     No further workup recommended at this time.  May continue decadron 2mg  daily and Keppra 500mg  BID in addition to anticoagulation.    All questions were answered. The patient knows to call the clinic with any problems, questions or concerns.  The total time spent in the encounter was 60 minutes and more than 50% was on counseling and review of test results     Henreitta Leber, MD 10/12/2022 3:09 PM

## 2022-10-12 NOTE — Telephone Encounter (Signed)
Returned PC to patient's daughter Erskine Squibb, she left VM stating the patient was found unresponsive this morning by her caregiver & is on the way to [facility] by ambulance.  She had a MD appointment today at 2 p.m. Daughter informed that  Dr Barbaraann Cao has been informed & is planning on seeing the patient in the ED later today. She verbalizes understanding.

## 2022-10-15 ENCOUNTER — Other Ambulatory Visit: Payer: Self-pay

## 2022-10-15 ENCOUNTER — Encounter: Payer: Self-pay | Admitting: Internal Medicine

## 2022-10-15 ENCOUNTER — Emergency Department (HOSPITAL_COMMUNITY): Payer: PPO

## 2022-10-15 ENCOUNTER — Emergency Department (HOSPITAL_COMMUNITY)
Admission: EM | Admit: 2022-10-15 | Discharge: 2022-10-15 | Disposition: A | Discharge status: Home / Self Care | Payer: PPO | Attending: Emergency Medicine | Admitting: Emergency Medicine

## 2022-10-15 DIAGNOSIS — R102 Pelvic and perineal pain: Secondary | ICD-10-CM | POA: Diagnosis not present

## 2022-10-15 DIAGNOSIS — W19XXXA Unspecified fall, initial encounter: Secondary | ICD-10-CM | POA: Diagnosis not present

## 2022-10-15 DIAGNOSIS — Y92129 Unspecified place in nursing home as the place of occurrence of the external cause: Secondary | ICD-10-CM | POA: Diagnosis not present

## 2022-10-15 DIAGNOSIS — S0990XA Unspecified injury of head, initial encounter: Secondary | ICD-10-CM | POA: Diagnosis not present

## 2022-10-15 DIAGNOSIS — S8002XA Contusion of left knee, initial encounter: Secondary | ICD-10-CM | POA: Insufficient documentation

## 2022-10-15 DIAGNOSIS — M25562 Pain in left knee: Secondary | ICD-10-CM | POA: Diagnosis not present

## 2022-10-15 DIAGNOSIS — Z7901 Long term (current) use of anticoagulants: Secondary | ICD-10-CM | POA: Insufficient documentation

## 2022-10-15 DIAGNOSIS — S8991XA Unspecified injury of right lower leg, initial encounter: Secondary | ICD-10-CM | POA: Diagnosis present

## 2022-10-15 DIAGNOSIS — S8001XA Contusion of right knee, initial encounter: Secondary | ICD-10-CM | POA: Diagnosis not present

## 2022-10-15 DIAGNOSIS — M25561 Pain in right knee: Secondary | ICD-10-CM | POA: Diagnosis not present

## 2022-10-15 DIAGNOSIS — G9389 Other specified disorders of brain: Secondary | ICD-10-CM | POA: Diagnosis not present

## 2022-10-15 DIAGNOSIS — S80919A Unspecified superficial injury of unspecified knee, initial encounter: Secondary | ICD-10-CM | POA: Diagnosis not present

## 2022-10-15 DIAGNOSIS — E041 Nontoxic single thyroid nodule: Secondary | ICD-10-CM | POA: Diagnosis not present

## 2022-10-15 DIAGNOSIS — R609 Edema, unspecified: Secondary | ICD-10-CM | POA: Diagnosis not present

## 2022-10-15 DIAGNOSIS — R001 Bradycardia, unspecified: Secondary | ICD-10-CM | POA: Diagnosis not present

## 2022-10-15 MED ORDER — AMLODIPINE BESYLATE 2.5 MG PO TABS: 5.0000 mg | ORAL_TABLET | Freq: Two times a day (BID) | ORAL | 0 refills | Status: DC

## 2022-10-15 NOTE — ED Provider Notes (Signed)
[place] [facility] AT [facility] Provider Note   CSN: [id number] Arrival date & time: 10/15/22  1654     History  Chief Complaint  Patient presents with   Marletta Lor    Lauren Hill is a 88 y.o. female.  HPI presents from nursing facility after a fall.  History is notable for glioblastoma now status post resection.  She notes that she has felt generally unwell for about 2 weeks, had a fall earlier in the week for which she had a evaluation here.  Today she was using her walker, slipped, fell.  She does not think that she struck her head, and denies current head pain, new weakness in any extremity.  She does note that she had pain in both of her knees right after the fall, this has improved.      Home Medications Prior to Admission medications   Medication Sig Start Date End Date Taking? Authorizing Provider  acetaminophen (TYLENOL) 500 MG tablet Take 500 mg by mouth every 6 (six) hours as needed for moderate pain or mild pain.    [provider]  amLODipine (NORVASC) 2.5 MG tablet Take 2 tablets (5 mg total) by mouth 2 (two) times daily. 10/15/22   Gerhard Munch, MD  apixaban (ELIQUIS) 5 MG TABS tablet Take 1 tablet (5 mg total) by mouth 2 (two) times daily. Start taking after completion of starter pack. 10/05/22   Pervis Hocking B, RPH-CPP  APIXABAN Everlene Balls) VTE STARTER PACK (10MG  AND 5MG ) Take as directed on package: start with two-5mg  tablets twice daily for 7 days. On day 8, switch to one-5mg  tablet twice daily. 10/05/22   Pervis Hocking B, RPH-CPP  atenolol (TENORMIN) 50 MG tablet Take 50 mg by mouth at bedtime.    [provider]  atorvastatin (LIPITOR) 10 MG tablet Take 10 mg by mouth daily at 6 PM.     [provider]  cholecalciferol (VITAMIN D3) 25 MCG (1000 UNIT) tablet Take 1,000 Units by mouth daily.    [provider]  dexamethasone (DECADRON) 2 MG tablet Take 1 tablet (2 mg total) by mouth daily. 08/24/22    Henreitta Leber, MD  escitalopram (LEXAPRO) 10 MG tablet Take 10 mg by mouth daily. 10/05/22   [provider]  levETIRAcetam (KEPPRA) 500 MG tablet TAKE ONE TABLET BY MOUTH TWICE DAILY 09/06/22   Henreitta Leber, MD  Potassium 99 MG TABS Take 99 mg by mouth at bedtime.    [provider]  Probiotic Product (PROBIOTIC PO) Take 1 capsule by mouth daily.    [provider]      Allergies    Patient has no known allergies.    Review of Systems   Review of Systems  All other systems reviewed and are negative.   Physical Exam Updated Vital Signs BP (!) 140/85 (BP Location: Right Arm) Comment: Simultaneous filing. User may not have seen previous data. Comment (BP Location): Simultaneous filing. User may not have seen previous data.  Pulse 86 Comment: Simultaneous filing. User may not have seen previous data.  Temp 97.9 F (36.6 C) (Oral) Comment: Simultaneous filing. User may not have seen previous data.  Resp 18 Comment: Simultaneous filing. User may not have seen previous data.  Ht 5\' 2"  (1.575 m)   Wt 73 kg   LMP 06/08/1983   SpO2 95% Comment: Simultaneous filing. User may not have seen previous data.  BMI 29.44 kg/m  Physical Exam Vitals and nursing note reviewed.  Constitutional:      General: She is not in acute distress.    Appearance: She is well-developed.  HENT:     Head: Normocephalic and atraumatic.  Eyes:     Conjunctiva/sclera: Conjunctivae normal.  Cardiovascular:     Rate and Rhythm: Normal rate and regular rhythm.  Pulmonary:     Effort: Pulmonary effort is normal. No respiratory distress.     Breath sounds: Normal breath sounds. No stridor.  Abdominal:     General: There is no distension.  Musculoskeletal:     Cervical back: Neck supple. No rigidity.     Comments: Pelvis stable.  She moves all ext spont and to command. Both knees w appreciable edema / hematoma, but she does move them both.  Skin:    General: Skin is warm and  dry.  Neurological:     Mental Status: She is alert and oriented to person, place, and time.     Cranial Nerves: No cranial nerve deficit.  Psychiatric:        Mood and Affect: Mood normal.     ED Results / Procedures / Treatments   Labs (all labs ordered are listed, but only abnormal results are displayed) Labs Reviewed - No data to display  EKG None  Radiology CT Head Wo Contrast  Result Date: 10/15/2022 CLINICAL DATA:  Trauma.  History of glioblastoma. EXAM: CT HEAD WITHOUT CONTRAST CT CERVICAL SPINE WITHOUT CONTRAST TECHNIQUE: Multidetector CT imaging of the head and cervical spine was performed following the standard protocol without intravenous contrast. Multiplanar CT image reconstructions of the cervical spine were also generated. RADIATION DOSE REDUCTION: This exam was performed according to the departmental dose-optimization program which includes automated exposure control, adjustment of the mA and/or kV according to patient size and/or use of iterative reconstruction technique. COMPARISON:  Head CT and cervical spine 07/11/2022. CT head 10/12/2022. FINDINGS: CT HEAD FINDINGS Brain: No acute intracranial hemorrhage, mass effect, extra-axial fluid collection or midline shift. Chronic periventricular and deep white matter hypodensity appears unchanged. Changes of prior right craniotomy encephalomalacia with calcifications in the right temporal region appear unchanged from prior. Moderate periventricular white matter hypodensity again seen. No hydrocephalus. Vascular: Atherosclerotic calcifications are present within the cavernous internal carotid arteries. Skull: Choose Sinuses/Orbits: There is opacification of left mastoid air cells. Paranasal sinuses demonstrate small air-fluid level in the left maxillary sinus. Orbits are within normal limits. Other: None. CT CERVICAL SPINE FINDINGS Alignment: Normal. Skull base and vertebrae: No acute fracture. No primary bone lesion or focal  pathologic process. Soft tissues and spinal canal: No prevertebral fluid or swelling. No visible canal hematoma. Disc levels: There is disc space narrowing and endplate osteophyte formation throughout the cervical spine most significant at C4-C5, C5-C6 and C6-C7. Degenerative changes affect facet joints bilaterally. There is no severe central canal stenosis at any level. Multilevel neural foraminal stenoses appear unchanged from prior. Upper chest: Negative. Other: There are atherosclerotic calcifications of the bilateral carotid arteries. Right thyroid nodules are present measuring up to 5 mm. IMPRESSION: 1. No acute intracranial process. 2. Stable postsurgical changes in the right temporal lobe. 3. No acute fracture or traumatic subluxation of the cervical spine. 4. Stable chronic small vessel ischemic changes. 5. Left mastoid effusion. 6. Small air-fluid level in the left maxillary sinus, correlate for acute sinusitis. 7. Subcentimeter incidental right thyroid nodule. No follow-up imaging is recommended. Reference: J Am Coll Radiol. 2015 Feb;12(2): 143-50 Electronically Signed   By: Darliss Cheney M.D.   On: 10/15/2022  18:47   CT Cervical Spine Wo Contrast  Result Date: 10/15/2022 CLINICAL DATA:  Trauma.  History of glioblastoma. EXAM: CT HEAD WITHOUT CONTRAST CT CERVICAL SPINE WITHOUT CONTRAST TECHNIQUE: Multidetector CT imaging of the head and cervical spine was performed following the standard protocol without intravenous contrast. Multiplanar CT image reconstructions of the cervical spine were also generated. RADIATION DOSE REDUCTION: This exam was performed according to the departmental dose-optimization program which includes automated exposure control, adjustment of the mA and/or kV according to patient size and/or use of iterative reconstruction technique. COMPARISON:  Head CT and cervical spine 07/11/2022. CT head 10/12/2022. FINDINGS: CT HEAD FINDINGS Brain: No acute intracranial hemorrhage, mass  effect, extra-axial fluid collection or midline shift. Chronic periventricular and deep white matter hypodensity appears unchanged. Changes of prior right craniotomy encephalomalacia with calcifications in the right temporal region appear unchanged from prior. Moderate periventricular white matter hypodensity again seen. No hydrocephalus. Vascular: Atherosclerotic calcifications are present within the cavernous internal carotid arteries. Skull: Choose Sinuses/Orbits: There is opacification of left mastoid air cells. Paranasal sinuses demonstrate small air-fluid level in the left maxillary sinus. Orbits are within normal limits. Other: None. CT CERVICAL SPINE FINDINGS Alignment: Normal. Skull base and vertebrae: No acute fracture. No primary bone lesion or focal pathologic process. Soft tissues and spinal canal: No prevertebral fluid or swelling. No visible canal hematoma. Disc levels: There is disc space narrowing and endplate osteophyte formation throughout the cervical spine most significant at C4-C5, C5-C6 and C6-C7. Degenerative changes affect facet joints bilaterally. There is no severe central canal stenosis at any level. Multilevel neural foraminal stenoses appear unchanged from prior. Upper chest: Negative. Other: There are atherosclerotic calcifications of the bilateral carotid arteries. Right thyroid nodules are present measuring up to 5 mm. IMPRESSION: 1. No acute intracranial process. 2. Stable postsurgical changes in the right temporal lobe. 3. No acute fracture or traumatic subluxation of the cervical spine. 4. Stable chronic small vessel ischemic changes. 5. Left mastoid effusion. 6. Small air-fluid level in the left maxillary sinus, correlate for acute sinusitis. 7. Subcentimeter incidental right thyroid nodule. No follow-up imaging is recommended. Reference: J Am Coll Radiol. 2015 Feb;12(2): 143-50 Electronically Signed   By: Darliss Cheney M.D.   On: 10/15/2022 18:47   DG Knee Complete 4 Views  Left  Result Date: 10/15/2022 CLINICAL DATA:  Recent fall with left knee pain, initial encounter EXAM: LEFT KNEE - COMPLETE 4+ VIEW COMPARISON:  None Available. FINDINGS: Tricompartmental degenerative changes are noted similar to that seen on the right knee. No joint effusion is seen. No acute fracture or dislocation is noted. IMPRESSION: Degenerative change without acute abnormality. Electronically Signed   By: Alcide Clever M.D.   On: 10/15/2022 18:02   DG Knee Complete 4 Views Right  Result Date: 10/15/2022 CLINICAL DATA:  Recent slip and fall with right knee pain, initial encounter EXAM: RIGHT KNEE - COMPLETE 4+ VIEW COMPARISON:  None Available. FINDINGS: Tricompartmental degenerative changes are noted. No joint effusion is seen. No acute fracture or dislocation is noted. No soft tissue changes are identified. IMPRESSION: Degenerative change without acute abnormality. Electronically Signed   By: Alcide Clever M.D.   On: 10/15/2022 18:01   DG Pelvis 1-2 Views  Result Date: 10/15/2022 CLINICAL DATA:  Recent slip and fall with hip pain, initial encounter EXAM: PELVIS - 1 VIEW COMPARISON:  07/11/2022 FINDINGS: Bilateral hip prostheses are seen. The pelvic ring is intact. No definitive loosening is seen. No soft tissue abnormality is noted. IMPRESSION: No  acute abnormality noted. Electronically Signed   By: Alcide Clever M.D.   On: 10/15/2022 18:00    Procedures Procedures    Medications Ordered in ED Medications - No data to display  ED Course/ Medical Decision Making/ A&P                             Medical Decision Making Elderly female presents after a fall.  Second 1 this week.  History of glioblastoma and use of Eliquis are both concerning, and consideration of intracranial changes, as well as fractures considered. Patient's vital signs were initially reassuring, pulses are appreciable in all extremities and she follows commands appropriately.  Patient had x-rays, CTs ordered. Monitor 85  sinus unremarkable Pulse ox 100% room air normal   Amount and/or Complexity of Data Reviewed Independent Historian: EMS    Details: Rhythm strip with sinus rhythm, rate 87, left axis deviation, unremarkable otherwise External Data Reviewed: notes.    Details: Eval from earlier this week reviewed including imaging studies Radiology: ordered and independent interpretation performed. Decision-making details documented in ED Course.  Risk Prescription drug management. Decision regarding hospitalization.   7:26 PM Patient awake, alert, now committed by her daughter.  With daughter present we discussed all findings and I reviewed her CT and x-rays myself. No notable findings on radiographic studies. She has remained hemodynamically unremarkable for hours here in the ED. Reviewed her ED evaluation from earlier this week and she notes that that time she had lightheadedness.  Daughter has concern for the patient's blood pressure medication regimen as well as ongoing Eliquis use. After long conversation we settled on patient will decrease her amlodipine dosing, continue Eliquis until she has a chance to speak with her primary care physician for consideration of alternatives to Eliquis, and reconsideration of her blood pressure medication regimen.  Here, with no hemodynamic instability, no arrhythmia, no notable findings on CT scan, patient is appropriate for close outpatient follow-up after discharge.        Final Clinical Impression(s) / ED Diagnoses Final diagnoses:  Fall, initial encounter    Rx / DC Orders ED Discharge Orders          Ordered    amLODipine (NORVASC) 2.5 MG tablet  2 times daily        10/15/22 1906              Gerhard Munch, MD 10/15/22 1926

## 2022-10-15 NOTE — Telephone Encounter (Signed)
Received call from pt's daughter Leanne Lovely. She is asking if pt could have an informational type meeting with Hospice ([facility]). Apparently the pt's family is on board for Hospice but the patient remains a bit reluctant  Advised that I can make the referral to [facility] and advise them of what pt is seeking-information.   Junie Panning voiced understanding.  Call made to Dionicio Stall, MSW, LCSW Hospice/hospital Liaison. Provided patient information and that pt is seeking information about Hospice and Palliative Care. Advised that Leanne Lovely is the contact person for them.  Danford Bad will arrange initial meeting.  Junie Panning is aware and grateful for this referral.

## 2022-10-15 NOTE — ED Triage Notes (Addendum)
Pt BIB EMS from [place] after mechanical fall. Pt states there was water on the floor,walker slipped and she fell. Denies LOC or hitting head, denies pain. Pt takes Eliquis

## 2022-10-15 NOTE — Progress Notes (Signed)
Civil engineer, contracting [facility]) Hospital Liaison Note  Referral made for information visit.   Please call with any questions or concerns. Thank you  Dionicio Stall, [facility], [place] [phone]

## 2022-10-15 NOTE — Discharge Instructions (Addendum)
Today's evaluation has been reassuring.  Given your frequency of falls it is importantly follow-up with your physician.  In particular discussed today's adjustment of your blood pressure medication regimen and consider possible alternatives to Eliquis.  Do not hesitate to return here for concerning changes in your condition.

## 2022-12-06 DEATH — deceased
